# Patient Record
Sex: Female | Born: 1990 | Race: White | Hispanic: No | Marital: Single | Smoking: Former smoker
Health system: Southern US, Community
[De-identification: ages and names within clinical notes are randomized; demographics above are authoritative.]

## PROBLEM LIST (undated history)

## (undated) ENCOUNTER — Inpatient Hospital Stay (HOSPITAL_COMMUNITY): Payer: Self-pay

## (undated) DIAGNOSIS — O24419 Gestational diabetes mellitus in pregnancy, unspecified control: Secondary | ICD-10-CM

## (undated) DIAGNOSIS — F319 Bipolar disorder, unspecified: Secondary | ICD-10-CM

## (undated) DIAGNOSIS — F32A Depression, unspecified: Secondary | ICD-10-CM

## (undated) DIAGNOSIS — F329 Major depressive disorder, single episode, unspecified: Secondary | ICD-10-CM

## (undated) DIAGNOSIS — G809 Cerebral palsy, unspecified: Secondary | ICD-10-CM

## (undated) DIAGNOSIS — F419 Anxiety disorder, unspecified: Secondary | ICD-10-CM

## (undated) HISTORY — PX: HERNIA REPAIR: SHX51

---

## 2012-10-04 HISTORY — PX: CHOLECYSTECTOMY: SHX55

## 2014-05-23 ENCOUNTER — Inpatient Hospital Stay (HOSPITAL_COMMUNITY)
Admission: AD | Admit: 2014-05-23 | Discharge: 2014-05-23 | Disposition: A | Discharge status: Home / Self Care | Payer: Medicaid Other | Source: Ambulatory Visit | Attending: Obstetrics & Gynecology | Admitting: Obstetrics & Gynecology

## 2014-05-23 ENCOUNTER — Encounter (HOSPITAL_COMMUNITY): Payer: Self-pay | Admitting: *Deleted

## 2014-05-23 ENCOUNTER — Other Ambulatory Visit: Payer: Self-pay

## 2014-05-23 ENCOUNTER — Inpatient Hospital Stay (HOSPITAL_COMMUNITY): Payer: Medicaid Other

## 2014-05-23 DIAGNOSIS — R4789 Other speech disturbances: Secondary | ICD-10-CM | POA: Diagnosis not present

## 2014-05-23 DIAGNOSIS — O210 Mild hyperemesis gravidarum: Secondary | ICD-10-CM | POA: Insufficient documentation

## 2014-05-23 DIAGNOSIS — O9989 Other specified diseases and conditions complicating pregnancy, childbirth and the puerperium: Secondary | ICD-10-CM

## 2014-05-23 DIAGNOSIS — F341 Dysthymic disorder: Secondary | ICD-10-CM | POA: Diagnosis not present

## 2014-05-23 DIAGNOSIS — G809 Cerebral palsy, unspecified: Secondary | ICD-10-CM | POA: Insufficient documentation

## 2014-05-23 DIAGNOSIS — O99340 Other mental disorders complicating pregnancy, unspecified trimester: Secondary | ICD-10-CM | POA: Diagnosis not present

## 2014-05-23 DIAGNOSIS — O99891 Other specified diseases and conditions complicating pregnancy: Secondary | ICD-10-CM | POA: Diagnosis not present

## 2014-05-23 DIAGNOSIS — R112 Nausea with vomiting, unspecified: Secondary | ICD-10-CM

## 2014-05-23 HISTORY — DX: Cerebral palsy, unspecified: G80.9

## 2014-05-23 LAB — COMPREHENSIVE METABOLIC PANEL
ALT: 11 U/L (ref 0–35)
ANION GAP: 10 (ref 5–15)
AST: 15 U/L (ref 0–37)
Albumin: 3.5 g/dL (ref 3.5–5.2)
Alkaline Phosphatase: 79 U/L (ref 39–117)
BUN: 5 mg/dL — AB (ref 6–23)
CALCIUM: 8.6 mg/dL (ref 8.4–10.5)
CO2: 24 mEq/L (ref 19–32)
CREATININE: 0.55 mg/dL (ref 0.50–1.10)
Chloride: 102 mEq/L (ref 96–112)
GFR calc non Af Amer: >90 mL/min (ref >90)
GLUCOSE: 81 mg/dL (ref 70–99)
Potassium: 4.2 mEq/L (ref 3.7–5.3)
Sodium: 136 mEq/L — ABNORMAL LOW (ref 137–147)
TOTAL PROTEIN: 6.1 g/dL (ref 6.0–8.3)
Total Bilirubin: 0.3 mg/dL (ref 0.3–1.2)

## 2014-05-23 LAB — URINALYSIS, ROUTINE W REFLEX MICROSCOPIC
Bilirubin Urine: NEGATIVE
Glucose, UA: NEGATIVE mg/dL
HGB URINE DIPSTICK: NEGATIVE
Ketones, ur: NEGATIVE mg/dL
Nitrite: NEGATIVE
PROTEIN: NEGATIVE mg/dL
SPECIFIC GRAVITY, URINE: 1.015 (ref 1.005–1.030)
UROBILINOGEN UA: 0.2 mg/dL (ref 0.0–1.0)
pH: 8 (ref 5.0–8.0)

## 2014-05-23 LAB — LIPASE, BLOOD: Lipase: 21 U/L (ref 11–59)

## 2014-05-23 LAB — CBC WITH DIFFERENTIAL/PLATELET
BASOS ABS: 0 10*3/uL (ref 0.0–0.1)
BASOS PCT: 0 % (ref 0–1)
EOS ABS: 0 10*3/uL (ref 0.0–0.7)
EOS PCT: 1 % (ref 0–5)
HEMATOCRIT: 35.8 % — AB (ref 36.0–46.0)
HEMOGLOBIN: 12.5 g/dL (ref 12.0–15.0)
Lymphocytes Relative: 16 % (ref 12–46)
Lymphs Abs: 0.8 10*3/uL (ref 0.7–4.0)
MCH: 29.8 pg (ref 26.0–34.0)
MCHC: 34.9 g/dL (ref 30.0–36.0)
MCV: 85.4 fL (ref 78.0–100.0)
MONO ABS: 0.5 10*3/uL (ref 0.1–1.0)
MONOS PCT: 10 % (ref 3–12)
Neutro Abs: 3.9 10*3/uL (ref 1.7–7.7)
Neutrophils Relative %: 73 % (ref 43–77)
Platelets: 169 10*3/uL (ref 150–400)
RBC: 4.19 MIL/uL (ref 3.87–5.11)
RDW: 14.4 % (ref 11.5–15.5)
WBC: 5.3 10*3/uL (ref 4.0–10.5)

## 2014-05-23 LAB — URINE MICROSCOPIC-ADD ON

## 2014-05-23 LAB — AMYLASE: Amylase: 83 U/L (ref 0–105)

## 2014-05-23 MED ORDER — ONDANSETRON HCL 4 MG PO TABS: 4.0000 mg | ORAL_TABLET | Freq: Three times a day (TID) | ORAL | Status: DC | PRN

## 2014-05-23 MED ORDER — LACTATED RINGERS IV SOLN
INTRAVENOUS | Status: DC
  Administered 2014-05-23: 15:00:00 via INTRAVENOUS

## 2014-05-23 MED ORDER — ONDANSETRON HCL 4 MG/2ML IJ SOLN
4.0000 mg | Freq: Once | INTRAMUSCULAR | Status: AC
  Administered 2014-05-23: 4 mg via INTRAVENOUS
  Filled 2014-05-23: qty 2

## 2014-05-23 MED ORDER — NITROFURANTOIN MONOHYD MACRO 100 MG PO CAPS: 100.0000 mg | ORAL_CAPSULE | Freq: Two times a day (BID) | ORAL | Status: AC

## 2014-05-23 NOTE — Discharge Instructions (Signed)

## 2014-05-23 NOTE — MAU Provider Note (Signed)
Caitlin Bell is a 23 y.o. G3 P0202 at 12.1 weeks by [facility] by Epic.  She received Depo for heavy irregular periods a few months ago so she not sure of her LMP.  She presents to MAU from the office c/o n/v and dizziness since last night.  The office staff reported that she looked like she was about to pass out.  She denies pain, cramping, vb or lof.  She reports recent medication for depression and anxiety: Ativan, Lithium, Remeron, Buspar and Ambien.   [place] she was seeing a high risk OB in [place] because the medications.         There are no active problems to display for this patient.   Chief Complaint  Patient presents with  . Emesis During Pregnancy  . Dizziness   HPI  OB History   Grav Para Term Preterm Abortions TAB SAB Ect Mult Living   3 2  2      2       Past Medical History  Diagnosis Date  . Cerebral palsy     mild, speech impediment    Past Surgical History  Procedure Laterality Date  . Cholecystectomy  2014  . Hernia repair      History reviewed. No pertinent family history.  History  Substance Use Topics  . Smoking status: Never Smoker   . Smokeless tobacco: Never Used  . Alcohol Use: Yes     Comment: ocasionally, not with the pregnancy    Allergies: No Known Allergies  Prescriptions prior to admission  Medication Sig Dispense Refill  . acetaminophen (TYLENOL) 500 MG tablet Take 1,000 mg by mouth every 6 (six) hours as needed.        ROS See HPI above, all other systems are negative  Physical Exam   Blood pressure 110/76, pulse 97, temperature 98.8 F (37.1 C), temperature source Oral, resp. rate 16, height 5' 3.5" (1.613 m), weight 122 lb (55.339 kg), not currently breastfeeding.  Physical Exam  Chest: CTA Heart: RRR w/o murmer Ext:  WNL ABD: Soft, non tender to palpation, no rebound or guarding SVE: deferred   ED Course  Assessment: IUP at  12.1weeks Membranes:  intact FHR: 177 CTX: none   Plan: Labs: CBC, cmp, amylase, lipase IV LR Dating and viability US Consult with Dr. Burna Bell   Caitlin Bell, CNM, MSN 05/23/2014. 1:46 PM

## 2014-05-23 NOTE — MAU Provider Note (Signed)
Addendum Results for orders placed during the hospital encounter of 05/23/14 (from the past 24 hour(s))  URINALYSIS, ROUTINE W REFLEX MICROSCOPIC     Status: Abnormal   Collection Time    05/23/14 12:56 PM      Result Value Ref Range   Color, Urine YELLOW  YELLOW   APPearance CLOUDY (*) CLEAR   Specific Gravity, Urine 1.015  1.005 - 1.030   pH 8.0  5.0 - 8.0   Glucose, UA NEGATIVE  NEGATIVE mg/dL   Hgb urine dipstick NEGATIVE  NEGATIVE   Bilirubin Urine NEGATIVE  NEGATIVE   Ketones, ur NEGATIVE  NEGATIVE mg/dL   Protein, ur NEGATIVE  NEGATIVE mg/dL   Urobilinogen, UA 0.2  0.0 - 1.0 mg/dL   Nitrite NEGATIVE  NEGATIVE   Leukocytes, UA MODERATE (*) NEGATIVE  URINE MICROSCOPIC-ADD ON     Status: Abnormal   Collection Time    05/23/14 12:56 PM      Result Value Ref Range   Squamous Epithelial / LPF FEW (*) RARE   WBC, UA 7-10  <3 WBC/hpf   RBC / HPF 0-2  <3 RBC/hpf   Bacteria, UA MANY (*) RARE   Urine-Other AMORPHOUS URATES/PHOSPHATES     Recent Results (from the past 2160 hour(s))  URINALYSIS, ROUTINE W REFLEX MICROSCOPIC     Status: Abnormal   Collection Time    05/23/14 12:56 PM      Result Value Ref Range   Color, Urine YELLOW  YELLOW   APPearance CLOUDY (*) CLEAR   Specific Gravity, Urine 1.015  1.005 - 1.030   pH 8.0  5.0 - 8.0   Glucose, UA NEGATIVE  NEGATIVE mg/dL   Hgb urine dipstick NEGATIVE  NEGATIVE   Bilirubin Urine NEGATIVE  NEGATIVE   Ketones, ur NEGATIVE  NEGATIVE mg/dL   Protein, ur NEGATIVE  NEGATIVE mg/dL   Urobilinogen, UA 0.2  0.0 - 1.0 mg/dL   Nitrite NEGATIVE  NEGATIVE   Leukocytes, UA MODERATE (*) NEGATIVE  URINE MICROSCOPIC-ADD ON     Status: Abnormal   Collection Time    05/23/14 12:56 PM      Result Value Ref Range   Squamous Epithelial / LPF FEW (*) RARE   WBC, UA 7-10  <3 WBC/hpf   RBC / HPF 0-2  <3 RBC/hpf   Bacteria, UA MANY (*) RARE   Urine-Other AMORPHOUS URATES/PHOSPHATES     Comment: MUCOUS PRESENT  CBC WITH DIFFERENTIAL     Status:  Abnormal   Collection Time    05/23/14  3:49 PM      Result Value Ref Range   WBC 5.3  4.0 - 10.5 K/uL   RBC 4.19  3.87 - 5.11 MIL/uL   Hemoglobin 12.5  12.0 - 15.0 g/dL   HCT 35.8 (*) 36.0 - 46.0 %   MCV 85.4  78.0 - 100.0 fL   MCH 29.8  26.0 - 34.0 pg   MCHC 34.9  30.0 - 36.0 g/dL   RDW 14.4  11.5 - 15.5 %   Platelets 169  150 - 400 K/uL   Neutrophils Relative % 73  43 - 77 %   Neutro Abs 3.9  1.7 - 7.7 K/uL   Lymphocytes Relative 16  12 - 46 %   Lymphs Abs 0.8  0.7 - 4.0 K/uL   Monocytes Relative 10  3 - 12 %   Monocytes Absolute 0.5  0.1 - 1.0 K/uL   Eosinophils Relative 1  0 - 5 %  Eosinophils Absolute 0.0  0.0 - 0.7 K/uL   Basophils Relative 0  0 - 1 %   Basophils Absolute 0.0  0.0 - 0.1 K/uL  COMPREHENSIVE METABOLIC PANEL     Status: Abnormal   Collection Time    05/23/14  3:49 PM      Result Value Ref Range   Sodium 136 (*) 137 - 147 mEq/L   Potassium 4.2  3.7 - 5.3 mEq/L   Chloride 102  96 - 112 mEq/L   CO2 24  19 - 32 mEq/L   Glucose, Bld 81  70 - 99 mg/dL   BUN 5 (*) 6 - 23 mg/dL   Creatinine, Ser 0.55  0.50 - 1.10 mg/dL   Calcium 8.6  8.4 - 10.5 mg/dL   Total Protein 6.1  6.0 - 8.3 g/dL   Albumin 3.5  3.5 - 5.2 g/dL   AST 15  0 - 37 U/L   ALT 11  0 - 35 U/L   Alkaline Phosphatase 79  39 - 117 U/L   Total Bilirubin 0.3  0.3 - 1.2 mg/dL   GFR calc non Af Amer >90  >90 mL/min   GFR calc Af Amer >90  >90 mL/min   Comment: (NOTE)     The eGFR has been calculated using the CKD EPI equation.     This calculation has not been validated in all clinical situations.     eGFR's persistently <90 mL/min signify possible Chronic Kidney     Disease.   Anion gap 10  5 - 15   Pt report feeling better after IVF and zofran [place] report:  Intrauterine gestational sac: Present  Yolk sac: Not seen  Embryo: Present  Cardiac Activity: Present  Heart Rate: 158 bpm  CRL: 53.3 mm 12 w 0 d [place] EDC: 12/05/2014  Maternal uterus/adnexae: No subchorionic hemorrhage identified. The   ovaries have a normal appearance. No free pelvic fluid.  IMPRESSION:  1. Single living intrauterine embryo at 12 weeks 0 days by  ultrasound.  2. EDC by today's exam is 12/05/2014.   UTI macrobid 1 tab po bid x7 days Zofran 78m po q8 prn for nausea FU in the office in 2 weeks    Trenyce Loera, CNM, MSN 05/23/2014. 3:44 PM

## 2014-05-23 NOTE — MAU Note (Signed)
Unable to hold anything down in the past 24hrs. Getting very light headed.  Also noted bruises on legs and arms- no reason for them

## 2014-05-30 LAB — OB RESULTS CONSOLE GC/CHLAMYDIA
CHLAMYDIA, DNA PROBE: NEGATIVE
Gonorrhea: NEGATIVE

## 2014-05-30 LAB — OB RESULTS CONSOLE RUBELLA ANTIBODY, IGM: RUBELLA: IMMUNE

## 2014-05-30 LAB — OB RESULTS CONSOLE ANTIBODY SCREEN: ANTIBODY SCREEN: NEGATIVE

## 2014-05-30 LAB — OB RESULTS CONSOLE ABO/RH: RH Type: POSITIVE

## 2014-05-30 LAB — OB RESULTS CONSOLE HEPATITIS B SURFACE ANTIGEN: Hepatitis B Surface Ag: NEGATIVE

## 2014-05-30 LAB — OB RESULTS CONSOLE HIV ANTIBODY (ROUTINE TESTING): HIV: NONREACTIVE

## 2014-05-30 LAB — OB RESULTS CONSOLE RPR: RPR: NONREACTIVE

## 2014-07-19 ENCOUNTER — Other Ambulatory Visit (HOSPITAL_COMMUNITY): Payer: Self-pay | Admitting: Family Medicine

## 2014-07-19 DIAGNOSIS — Z3689 Encounter for other specified antenatal screening: Secondary | ICD-10-CM

## 2014-07-19 DIAGNOSIS — Z3A21 21 weeks gestation of pregnancy: Secondary | ICD-10-CM

## 2014-07-19 DIAGNOSIS — O283 Abnormal ultrasonic finding on antenatal screening of mother: Secondary | ICD-10-CM

## 2014-07-19 DIAGNOSIS — O358XX1 Maternal care for other (suspected) fetal abnormality and damage, fetus 1: Secondary | ICD-10-CM

## 2014-07-23 ENCOUNTER — Ambulatory Visit (HOSPITAL_COMMUNITY)
Admission: RE | Admit: 2014-07-23 | Discharge: 2014-07-23 | Disposition: A | Discharge status: Home / Self Care | Payer: Medicaid Other | Source: Ambulatory Visit | Attending: Certified Nurse Midwife | Admitting: Certified Nurse Midwife

## 2014-07-23 ENCOUNTER — Encounter (HOSPITAL_COMMUNITY): Payer: Self-pay

## 2014-07-23 VITALS — BP 119/73 | HR 105 | Wt 132.2 lb

## 2014-07-23 DIAGNOSIS — Z36 Encounter for antenatal screening of mother: Secondary | ICD-10-CM | POA: Diagnosis not present

## 2014-07-23 DIAGNOSIS — O358XX0 Maternal care for other (suspected) fetal abnormality and damage, not applicable or unspecified: Secondary | ICD-10-CM | POA: Insufficient documentation

## 2014-07-23 DIAGNOSIS — Z3A21 21 weeks gestation of pregnancy: Secondary | ICD-10-CM | POA: Diagnosis not present

## 2014-07-23 DIAGNOSIS — Z3A20 20 weeks gestation of pregnancy: Secondary | ICD-10-CM

## 2014-07-23 DIAGNOSIS — O283 Abnormal ultrasonic finding on antenatal screening of mother: Secondary | ICD-10-CM

## 2014-07-23 DIAGNOSIS — O358XX1 Maternal care for other (suspected) fetal abnormality and damage, fetus 1: Secondary | ICD-10-CM

## 2014-07-23 DIAGNOSIS — Z3689 Encounter for other specified antenatal screening: Secondary | ICD-10-CM

## 2014-07-23 HISTORY — DX: Major depressive disorder, single episode, unspecified: F32.9

## 2014-07-23 HISTORY — DX: Depression, unspecified: F32.A

## 2014-07-23 HISTORY — DX: Gestational diabetes mellitus in pregnancy, unspecified control: O24.419

## 2014-07-23 HISTORY — DX: Anxiety disorder, unspecified: F41.9

## 2014-07-31 ENCOUNTER — Encounter: Payer: Medicaid Other | Attending: Obstetrics and Gynecology

## 2014-07-31 VITALS — Ht 65.0 in | Wt 136.5 lb

## 2014-07-31 DIAGNOSIS — Z713 Dietary counseling and surveillance: Secondary | ICD-10-CM | POA: Insufficient documentation

## 2014-07-31 DIAGNOSIS — R7302 Impaired glucose tolerance (oral): Secondary | ICD-10-CM | POA: Insufficient documentation

## 2014-07-31 DIAGNOSIS — R7309 Other abnormal glucose: Secondary | ICD-10-CM

## 2014-07-31 NOTE — Progress Notes (Signed)
  Patient was seen on 07/31/14 for Gestational Diabetes self-management class at the [facility]. The following learning objectives were met by the patient during this course:   States the definition of Gestational Diabetes  States why dietary management is important in controlling blood glucose  Describes the effects each nutrient has on blood glucose levels  Demonstrates ability to create a balanced meal plan  Demonstrates carbohydrate counting   States when to check blood glucose levels  Demonstrates proper blood glucose monitoring techniques  States the effect of stress and exercise on blood glucose levels  States the importance of limiting caffeine and abstaining from alcohol and smoking  Blood glucose monitor given: Accu Chek Nano BG Monitoring Kit Lot # [id number] Exp: 06/04/15 Blood glucose reading: 89  Patient instructed to monitor glucose levels: FBS: 60 - <90 1 hour: <140 2 hour: <120  *Patient received handouts:  Nutrition Diabetes and Pregnancy  Carbohydrate Counting List  Patient will be seen for follow-up as needed.

## 2014-08-13 ENCOUNTER — Other Ambulatory Visit (HOSPITAL_COMMUNITY): Payer: Self-pay

## 2014-09-14 ENCOUNTER — Encounter (HOSPITAL_COMMUNITY): Payer: Self-pay

## 2014-09-14 ENCOUNTER — Inpatient Hospital Stay (HOSPITAL_COMMUNITY)
Admission: AD | Admit: 2014-09-14 | Discharge: 2014-09-14 | Disposition: A | Discharge status: Home / Self Care | Payer: Medicaid Other | Source: Ambulatory Visit | Attending: Obstetrics and Gynecology | Admitting: Obstetrics and Gynecology

## 2014-09-14 DIAGNOSIS — Z3A28 28 weeks gestation of pregnancy: Secondary | ICD-10-CM | POA: Diagnosis not present

## 2014-09-14 DIAGNOSIS — R4789 Other speech disturbances: Secondary | ICD-10-CM | POA: Insufficient documentation

## 2014-09-14 DIAGNOSIS — O9989 Other specified diseases and conditions complicating pregnancy, childbirth and the puerperium: Secondary | ICD-10-CM | POA: Diagnosis not present

## 2014-09-14 DIAGNOSIS — N898 Other specified noninflammatory disorders of vagina: Secondary | ICD-10-CM

## 2014-09-14 DIAGNOSIS — G809 Cerebral palsy, unspecified: Secondary | ICD-10-CM | POA: Insufficient documentation

## 2014-09-14 DIAGNOSIS — O26892 Other specified pregnancy related conditions, second trimester: Secondary | ICD-10-CM

## 2014-09-14 LAB — FETAL FIBRONECTIN: Fetal Fibronectin: NEGATIVE

## 2014-09-14 LAB — URINALYSIS, ROUTINE W REFLEX MICROSCOPIC
Bilirubin Urine: NEGATIVE
Glucose, UA: NEGATIVE mg/dL
Hgb urine dipstick: NEGATIVE
Ketones, ur: NEGATIVE mg/dL
Nitrite: NEGATIVE
Protein, ur: NEGATIVE mg/dL
Specific Gravity, Urine: 1.015 (ref 1.005–1.030)
Urobilinogen, UA: 0.2 mg/dL (ref 0.0–1.0)
pH: 7 (ref 5.0–8.0)

## 2014-09-14 LAB — GLUCOSE, CAPILLARY: Glucose-Capillary: 78 mg/dL (ref 70–99)

## 2014-09-14 LAB — WET PREP, GENITAL
Clue Cells Wet Prep HPF POC: NONE SEEN
TRICH WET PREP: NONE SEEN
Yeast Wet Prep HPF POC: NONE SEEN

## 2014-09-14 LAB — URINE MICROSCOPIC-ADD ON

## 2014-09-14 NOTE — MAU Provider Note (Signed)
History    Caitlin Bell is a 23 y.o. [phone] at 28.2wks who presents for vaginal discharge since last night.  Patient reports some right sided abdominal cramping that started yesterday.  Patient reports taking tylenol with no relief and states pain is constant.  No recent sexual intercourse, no problems with urination or constipation/diarrhea.  Reports ongoing nausea that is treated with zofran. Patient reports vaginal itching and states discharge is thick and white.  Patient reports fasting BS of 89 and no PP for breakfast.  Patient admits to some concern regarding history of PTL and reports she may be having braxton hicks contractions.  Denies LoF, VB, and reports fetal movement.   There are no active problems to display for this patient.   No chief complaint on file.  HPI  OB History    Gravida Para Term Preterm AB TAB SAB Ectopic Multiple Living   3 2 0 2      2      Past Medical History  Diagnosis Date  . Cerebral palsy     mild, speech impediment  . Anxiety   . Gestational diabetes   . Depression     Past Surgical History  Procedure Laterality Date  . Cholecystectomy  2014  . Hernia repair      No family history on file.  History  Substance Use Topics  . Smoking status: Never Smoker   . Smokeless tobacco: Never Used  . Alcohol Use: Yes     Comment: ocasionally, not with the pregnancy    Allergies: No Known Allergies  Prescriptions prior to admission  Medication Sig Dispense Refill Last Dose  . acetaminophen (TYLENOL) 500 MG tablet Take 1,000 mg by mouth every 6 (six) hours as needed.   Taking  . Lurasidone HCl (LATUDA PO) Take by mouth.   Taking  . ondansetron (ZOFRAN) 4 MG tablet Take 1 tablet (4 mg total) by mouth every 8 (eight) hours as needed for nausea or vomiting. 20 tablet 0 Taking    Review of Systems  Gastrointestinal: Positive for nausea.  Genitourinary: Negative.   All other systems reviewed and are negative.   See HPI Above Physical Exam    Blood pressure 118/66, pulse 99, temperature 98.3 F (36.8 C), temperature source Oral, resp. rate 18, not currently breastfeeding.  Results for orders placed or performed during the hospital encounter of 09/14/14 (from the past 24 hour(s))  Urinalysis, Routine w reflex microscopic     Status: Abnormal   Collection Time: 09/14/14 11:18 AM  Result Value Ref Range   Color, Urine YELLOW YELLOW   APPearance CLEAR CLEAR   Specific Gravity, Urine 1.015 1.005 - 1.030   pH 7.0 5.0 - 8.0   Glucose, UA NEGATIVE NEGATIVE mg/dL   Hgb urine dipstick NEGATIVE NEGATIVE   Bilirubin Urine NEGATIVE NEGATIVE   Ketones, ur NEGATIVE NEGATIVE mg/dL   Protein, ur NEGATIVE NEGATIVE mg/dL   Urobilinogen, UA 0.2 0.0 - 1.0 mg/dL   Nitrite NEGATIVE NEGATIVE   Leukocytes, UA SMALL (A) NEGATIVE  Urine microscopic-add on     Status: Abnormal   Collection Time: 09/14/14 11:18 AM  Result Value Ref Range   Squamous Epithelial / LPF FEW (A) RARE   WBC, UA 3-6 <3 WBC/hpf   Bacteria, UA FEW (A) RARE   Urine-Other MUCOUS PRESENT   Wet prep, genital     Status: Abnormal   Collection Time: 09/14/14 12:25 PM  Result Value Ref Range   Yeast Wet Prep HPF POC  NONE SEEN NONE SEEN   Trich, Wet Prep NONE SEEN NONE SEEN   Clue Cells Wet Prep HPF POC NONE SEEN NONE SEEN   WBC, Wet Prep HPF POC MODERATE (A) NONE SEEN  Fetal fibronectin     Status: None   Collection Time: 09/14/14 12:25 PM  Result Value Ref Range   Fetal Fibronectin NEGATIVE NEGATIVE  Glucose, capillary     Status: None   Collection Time: 09/14/14 12:33 PM  Result Value Ref Range   Glucose-Capillary 78 70 - 99 mg/dL    Physical Exam  Constitutional: She is oriented to person, place, and time.  GI: Soft. Normal appearance and bowel sounds are normal. There is no tenderness.  Gravid--fundal height appears AGA, Soft, NT  Genitourinary: Uterus is enlarged. Cervix exhibits discharge. Cervix exhibits no motion tenderness and no friability.  Sterile  Speculum Exam: -Vaginal Vault: Small amt thin white discharge-wet prep collected -Cervix: Thin white discharge from os fFN collected from posterior fornix Bimanual Exam: Closed/Long/Thick/Ballotable/Soft  Musculoskeletal: Normal range of motion.  Neurological: She is alert and oriented to person, place, and time.  Skin: Skin is warm and dry. Petechiae and rash noted. Rash is papular and pustular.  Rash on left leg near ankle.  Many papular spots around what appears a ruptured pustular spot.    FHR: 135 bpm, Mod Var, -Decels, +Accels UC: Intermittent, palpates mild  ED Course  Assessment: IUP at 28.2wks Cat I FT Vaginal Discharge  Plan: -PE as above -Labs: UA, fFN, Wet Prep -Continue to monitor  Follow Up (1350) -fFN negative -UA: Few Bacteria--Culture Sent -Wet prep-negative -Discussed results with patient -Educated on need to schedule appt and maintain adequate PNC -Discussed need to take CBGs QID and record findings -Bleeding and PTL Precautions -Encouraged to call if any questions or concerns arise prior to next scheduled office visit.  -Discharged to home in stable condition -[facility]  Marlene BastMLY, Maridel Pixler LYNN MSN, CNM 09/14/2014 11:16 AM

## 2014-09-14 NOTE — MAU Note (Signed)
Pt presents complaining of irregular contractions that started 2 days ago and a white discharge. Pt also has not felt baby move since yesterday. Denies vaginal bleeding or loss of fluid

## 2014-09-14 NOTE — Discharge Instructions (Signed)
Abdominal Pain During Pregnancy °Belly (abdominal) pain is common during pregnancy. Most of the time, it is not a serious problem. Other times, it can be a sign that something is wrong with the pregnancy. Always tell your doctor if you have belly pain. °HOME CARE °Monitor your belly pain for any changes. The following actions may help you feel better: °· Do not have sex (intercourse) or put anything in your vagina until you feel better. °· Rest until your pain stops. °· Drink clear fluids if you feel sick to your stomach (nauseous). Do not eat solid food until you feel better. °· Only take medicine as told by your doctor. °· Keep all doctor visits as told. °GET HELP RIGHT AWAY IF:  °· You are bleeding, leaking fluid, or pieces of tissue come out of your vagina. °· You have more pain or cramping. °· You keep throwing up (vomiting). °· You have pain when you pee (urinate) or have blood in your pee. °· You have a fever. °· You do not feel your baby moving as much. °· You feel very weak or feel like passing out. °· You have trouble breathing, with or without belly pain. °· You have a very bad headache and belly pain. °· You have fluid leaking from your vagina and belly pain. °· You keep having watery poop (diarrhea). °· Your belly pain does not go away after resting, or the pain gets worse. °MAKE SURE YOU:  °· Understand these instructions. °· Will watch your condition. °· Will get help right away if you are not doing well or get worse. °Document Released: 09/08/2009 Document Revised: 05/23/2013 Document Reviewed: 04/19/2013 °[employer]® Patient Information ©2015 [employer]. This information is not intended to replace advice given to you by your health care provider. Make sure you discuss any questions you have with your health care provider. ° °

## 2014-09-15 LAB — CULTURE, OB URINE

## 2014-10-04 NOTE — L&D Delivery Note (Signed)
Vaginal Delivery Note The pt utilized an epidural as pain management.   Spontaneous rupture of membranes yesterday at 0001, clear.  GBS was positive, AMP x 1 doses was given.  Cervical dilation was complete at 0006   Spontaneous self directed pushing began at  0004.  After 5 minutes of pushing the head, shoulders and the body of a viable female infant "Cornelius MorasOwen" delivered spontaneously with maternal effort in the ROA position at 0009.   Loose [place] x 1, easily reduced.  The cord was clamped, cut and the infant was handed to the NICU team/nursing staff for immediate assessment .    Spontaneous delivery of a intact placenta with a 3 vessel cord via [place]  0013.   Episiotomy: None   The vulva, perineum, vaginal vault, rectum and cervix were inspected and revealed a 1 degree perineal, 1 degree vaginal which was repair using a 3-0 vicryl on a CT needle and a superficial bilateral labial -  bilateral periurethral laceration which was repaired using a 4-0 vicryl on a SH needle with 20cc of 1% lidocaine.   Postpartum pitocin as ordered.  Fundus firm, lochia minimum, bleeding under control.    EBL 100, Pt hemodynamically stable.   Sponge, laps and needle count correct and verified with the primary care nurse.  Attending MD available at all times.    Mom and baby were left in stable condition, baby skin to skin. Routine postpartum orders   Mother desires Depo for contraception   Infant to have OP patient circumcision   Placenta to pathology: NO     Cord Gases sent to lab: NO Cord blood sent to lab: YES   APGARS:  7 at 1 minute and 8 at 5 minutes Weight:. pending     Daijanae Rafalski, CNM, MSN 10/31/2014. 3:04 AM

## 2014-10-14 ENCOUNTER — Encounter (HOSPITAL_COMMUNITY): Payer: Self-pay | Admitting: *Deleted

## 2014-10-14 ENCOUNTER — Inpatient Hospital Stay (HOSPITAL_COMMUNITY)
Admission: AD | Admit: 2014-10-14 | Discharge: 2014-10-14 | Disposition: A | Discharge status: Home / Self Care | Payer: Medicaid Other | Source: Ambulatory Visit | Attending: Obstetrics and Gynecology | Admitting: Obstetrics and Gynecology

## 2014-10-14 DIAGNOSIS — M549 Dorsalgia, unspecified: Secondary | ICD-10-CM | POA: Insufficient documentation

## 2014-10-14 DIAGNOSIS — Z3A32 32 weeks gestation of pregnancy: Secondary | ICD-10-CM | POA: Insufficient documentation

## 2014-10-14 DIAGNOSIS — O9989 Other specified diseases and conditions complicating pregnancy, childbirth and the puerperium: Secondary | ICD-10-CM | POA: Diagnosis not present

## 2014-10-14 DIAGNOSIS — O99891 Other specified diseases and conditions complicating pregnancy: Secondary | ICD-10-CM | POA: Diagnosis present

## 2014-10-14 LAB — URINALYSIS, ROUTINE W REFLEX MICROSCOPIC
BILIRUBIN URINE: NEGATIVE
GLUCOSE, UA: NEGATIVE mg/dL
KETONES UR: NEGATIVE mg/dL
Nitrite: NEGATIVE
Protein, ur: NEGATIVE mg/dL
SPECIFIC GRAVITY, URINE: 1.015 (ref 1.005–1.030)
Urobilinogen, UA: 0.2 mg/dL (ref 0.0–1.0)
pH: 7.5 (ref 5.0–8.0)

## 2014-10-14 LAB — GLUCOSE, CAPILLARY: Glucose-Capillary: 77 mg/dL (ref 70–99)

## 2014-10-14 LAB — URINE MICROSCOPIC-ADD ON

## 2014-10-14 MED ORDER — ACETAMINOPHEN 500 MG PO TABS
1000.0000 mg | ORAL_TABLET | Freq: Once | ORAL | Status: AC
  Administered 2014-10-14: 1000 mg via ORAL
  Filled 2014-10-14: qty 2

## 2014-10-14 MED ORDER — ACETAMINOPHEN 325 MG PO TABS: 650.0000 mg | ORAL_TABLET | Freq: Once | ORAL | Status: DC

## 2014-10-14 NOTE — MAU Note (Signed)
Pt reports lower back pain 7/10 x2 days that has gotten worse over last hour, along with intermittent abdominal tightening x 2 hrs every 30 min. No bleeding, leaking, +FM.
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
## 2014-10-14 NOTE — Discharge Instructions (Signed)
Back Injury Prevention Back injuries can be extremely painful and difficult to heal. After having one back injury, you are much more likely to experience another later on. It is important to learn how to avoid injuring or re-injuring your back. The following tips can help you to prevent a back injury. PHYSICAL FITNESS  Exercise regularly and try to develop good tone in your abdominal muscles. Your abdominal muscles provide a lot of the support needed by your back.  Do aerobic exercises (walking, jogging, biking, swimming) regularly.  Do exercises that increase balance and strength (tai chi, yoga) regularly. This can decrease your risk of falling and injuring your back.  Stretch before and after exercising.  Maintain a healthy weight. The more you weigh, the more stress is placed on your back. For every pound of weight, 10 times that amount of pressure is placed on the back. DIET  Talk to your caregiver about how much calcium and vitamin D you need per day. These nutrients help to prevent weakening of the bones (osteoporosis). Osteoporosis can cause broken (fractured) bones that lead to back pain.  Include good sources of calcium in your diet, such as dairy products, green, leafy vegetables, and products with calcium added (fortified).  Include good sources of vitamin D in your diet, such as milk and foods that are fortified with vitamin D.  Consider taking a nutritional supplement or a multivitamin if needed.  Stop smoking if you smoke. POSTURE  Sit and stand up straight. Avoid leaning forward when you sit or hunching over when you stand.  Choose chairs with good low back (lumbar) support.  If you work at a desk, sit close to your work so you do not need to lean over. Keep your chin tucked in. Keep your neck drawn back and elbows bent at a right angle. Your arms should look like the letter "L."  Sit high and close to the steering wheel when you drive. Add a lumbar support to your car  seat if needed.  Avoid sitting or standing in one position for too long. Take breaks to get up, stretch, and walk around at least once every hour. Take breaks if you are driving for long periods of time.  Sleep on your side with your knees slightly bent, or sleep on your back with a pillow under your knees. Do not sleep on your stomach. LIFTING, TWISTING, AND REACHING  Avoid heavy lifting, especially repetitive lifting. If you must do heavy lifting:  Stretch before lifting.  Work slowly.  Rest between lifts.  Use carts and dollies to move objects when possible.  Make several small trips instead of carrying 1 heavy load.  Ask for help when you need it.  Ask for help when moving big, awkward objects.  Follow these steps when lifting:  Stand with your feet shoulder-width apart.  Get as close to the object as you can. Do not try to pick up heavy objects that are far from your body.  Use handles or lifting straps if they are available.  Bend at your knees. Squat down, but keep your heels off the floor.  Keep your shoulders pulled back, your chin tucked in, and your back straight.  Lift the object slowly, tightening the muscles in your legs, abdomen, and buttocks. Keep the object as close to the center of your body as possible.  When you put a load down, use these same guidelines in reverse.  Do not:  Lift the object above your waist.  Twist at the waist while lifting or carrying a load. Move your feet if you need to turn, not your waist.  Bend over without bending at your knees.  Avoid reaching over your head, across a table, or for an object on a high surface. OTHER TIPS  Avoid wet floors and keep sidewalks clear of ice to prevent falls.  Do not sleep on a mattress that is too soft or too hard.  Keep items that are used frequently within easy reach.  Put heavier objects on shelves at waist level and lighter objects on lower or higher shelves.  Find ways to  decrease your stress, such as exercise, massage, or relaxation techniques. Stress can build up in your muscles. Tense muscles are more vulnerable to injury.  Seek treatment for depression or anxiety if needed. These conditions can increase your risk of developing back pain. SEEK MEDICAL CARE IF:  You injure your back.  You have questions about diet, exercise, or other ways to prevent back injuries. MAKE SURE YOU:  Understand these instructions.  Will watch your condition.  Will get help right away if you are not doing well or get worse. Document Released: 10/28/2004 Document Revised: 12/13/2011 Document Reviewed: 11/01/2011 [facility] Patient Information 2015 [place], [place]. This information is not intended to replace advice given to you by your health care provider. Make sure you discuss any questions you have with your health care provider. Back Pain in Pregnancy Back pain during pregnancy is common. It happens in about half of all pregnancies. It is important for you and your baby that you remain active during your pregnancy.If you feel that back pain is not allowing you to remain active or sleep well, it is time to see your caregiver. Back pain may be caused by several factors related to changes during your pregnancy.Fortunately, unless you had trouble with your back before your pregnancy, the pain is likely to get better after you deliver. Low back pain usually occurs between the fifth and seventh months of pregnancy. It can, however, happen in the first couple months. Factors that increase the risk of back problems include:   Previous back problems.  Injury to your back.  Having twins or multiple births.  A chronic cough.  Stress.  Job-related repetitive motions.  Muscle or spinal disease in the back.  Family history of back problems, ruptured (herniated) discs, or osteoporosis.  Depression, anxiety, and panic attacks. CAUSES   When you are pregnant, your body produces  a hormone called relaxin. This hormonemakes the ligaments connecting the low back and pubic bones more flexible. This flexibility allows the baby to be delivered more easily. When your ligaments are loose, your muscles need to work harder to support your back. Soreness in your back can come from tired muscles. Soreness can also come from back tissues that are irritated since they are receiving less support.  As the baby grows, it puts pressure on the nerves and blood vessels in your pelvis. This can cause back pain.  As the baby grows and gets heavier during pregnancy, the uterus pushes the stomach muscles forward and changes your center of gravity. This makes your back muscles work harder to maintain good posture. SYMPTOMS  Lumbar pain during pregnancy Lumbar pain during pregnancy usually occurs at or above the waist in the center of the back. There may be pain and numbness that radiates into your leg or foot. This is similar to low back pain experienced by non-pregnant women. It usually increases with  sitting for long periods of time, standing, or repetitive lifting. Tenderness may also be present in the muscles along your upper back. Posterior pelvic pain during pregnancy Pain in the back of the pelvis is more common than lumbar pain in pregnancy. It is a deep pain felt in your side at the waistline, or across the tailbone (sacrum), or in both places. You may have pain on one or both sides. This pain can also go into the buttocks and backs of the upper thighs. Pubic and groin pain may also be present. The pain does not quickly resolve with rest, and morning stiffness may also be present. Pelvic pain during pregnancy can be brought on by most activities. A high level of fitness before and during pregnancy may or may not prevent this problem. Labor pain is usually 1 to 2 minutes apart, lasts for about 1 minute, and involves a bearing down feeling or pressure in your pelvis. However, if you are at term  with the pregnancy, constant low back pain can be the beginning of early labor, and you should be aware of this. DIAGNOSIS  X-rays of the back should not be done during the first 12 to 14 weeks of the pregnancy and only when absolutely necessary during the rest of the pregnancy. MRIs do not give off radiation and are safe during pregnancy. MRIs also should only be done when absolutely necessary. HOME CARE INSTRUCTIONS  Exercise as directed by your caregiver. Exercise is the most effective way to prevent or manage back pain. If you have a back problem, it is especially important to avoid sports that require sudden body movements. Swimming and walking are great activities.  Do not stand in one place for long periods of time.  Do not wear high heels.  Sit in chairs with good posture. Use a pillow on your lower back if necessary. Make sure your head rests over your shoulders and is not hanging forward.  Try sleeping on your side, preferably the left side, with a pillow or two between your legs. If you are sore after a night's rest, your bedmay betoo soft.Try placing a board between your mattress and box spring.  Listen to your body when lifting.If you are experiencing pain, ask for help or try bending yourknees more so you can use your leg muscles rather than your back muscles. Squat down when picking up something from the floor. Do not bend over.  Eat a healthy diet. Try to gain weight within your caregiver's recommendations.  Use heat or cold packs 3 to 4 times a day for 15 minutes to help with the pain.  Only take over-the-counter or prescription medicines for pain, discomfort, or fever as directed by your caregiver. Sudden (acute) back pain  Use bed rest for only the most extreme, acute episodes of back pain. Prolonged bed rest over 48 hours will aggravate your condition.  Ice is very effective for acute conditions.  Put ice in a plastic bag.  Place a towel between your skin and  the bag.  Leave the ice on for 10 to 20 minutes every 2 hours, or as needed.  Using heat packs for 30 minutes prior to activities is also helpful. Continued back pain See your caregiver if you have continued problems. Your caregiver can help or refer you for appropriate physical therapy. With conditioning, most back problems can be avoided. Sometimes, a more serious issue may be the cause of back pain. You should be seen right away if new problems  seem to be developing. Your caregiver may recommend:  A maternity girdle.  An elastic sling.  A back brace.  A massage therapist or acupuncture. SEEK MEDICAL CARE IF:   You are not able to do most of your daily activities, even when taking the pain medicine you were given.  You need a referral to a physical therapist or chiropractor.  You want to try acupuncture. SEEK IMMEDIATE MEDICAL CARE IF:  You develop numbness, tingling, weakness, or problems with the use of your arms or legs.  You develop severe back pain that is no longer relieved with medicines.  You have a sudden change in bowel or bladder control.  You have increasing pain in other areas of the body.  You develop shortness of breath, dizziness, or fainting.  You develop nausea, vomiting, or sweating.  You have back pain which is similar to labor pains.  You have back pain along with your water breaking or vaginal bleeding.  You have back pain or numbness that travels down your leg.  Your back pain developed after you fell.  You develop pain on one side of your back. You may have a kidney stone.  You see blood in your urine. You may have a bladder infection or kidney stone.  You have back pain with blisters. You may have shingles. Back pain is fairly common during pregnancy but should not be accepted as just part of the process. Back pain should always be treated as soon as possible. This will make your pregnancy as pleasant as possible. Document Released:  12/29/2005 Document Revised: 12/13/2011 Document Reviewed: 02/09/2011 [facility] Patient Information 2015 [place], [place]. This information is not intended to replace advice given to you by your health care provider. Make sure you discuss any questions you have with your health care provider.

## 2014-10-15 LAB — CULTURE, OB URINE: Colony Count: >=100000

## 2014-10-30 ENCOUNTER — Inpatient Hospital Stay (HOSPITAL_COMMUNITY): Payer: Medicaid Other

## 2014-10-30 ENCOUNTER — Inpatient Hospital Stay (HOSPITAL_COMMUNITY): Payer: Medicaid Other | Admitting: Anesthesiology

## 2014-10-30 ENCOUNTER — Inpatient Hospital Stay (HOSPITAL_COMMUNITY)
Admission: AD | Admit: 2014-10-30 | Discharge: 2014-11-02 | DRG: 775 | Disposition: A | Discharge status: Home / Self Care | Payer: Medicaid Other | Source: Ambulatory Visit | Attending: Obstetrics and Gynecology | Admitting: Obstetrics and Gynecology

## 2014-10-30 ENCOUNTER — Encounter (HOSPITAL_COMMUNITY): Payer: Self-pay | Admitting: General Practice

## 2014-10-30 DIAGNOSIS — O99344 Other mental disorders complicating childbirth: Secondary | ICD-10-CM | POA: Diagnosis present

## 2014-10-30 DIAGNOSIS — Z8751 Personal history of pre-term labor: Secondary | ICD-10-CM

## 2014-10-30 DIAGNOSIS — O09219 Supervision of pregnancy with history of pre-term labor, unspecified trimester: Secondary | ICD-10-CM

## 2014-10-30 DIAGNOSIS — O2662 Liver and biliary tract disorders in childbirth: Principal | ICD-10-CM | POA: Diagnosis present

## 2014-10-30 DIAGNOSIS — F411 Generalized anxiety disorder: Secondary | ICD-10-CM | POA: Diagnosis present

## 2014-10-30 DIAGNOSIS — Z3A34 34 weeks gestation of pregnancy: Secondary | ICD-10-CM | POA: Diagnosis present

## 2014-10-30 DIAGNOSIS — O99354 Diseases of the nervous system complicating childbirth: Secondary | ICD-10-CM | POA: Diagnosis present

## 2014-10-30 DIAGNOSIS — F1291 Cannabis use, unspecified, in remission: Secondary | ICD-10-CM

## 2014-10-30 DIAGNOSIS — F319 Bipolar disorder, unspecified: Secondary | ICD-10-CM | POA: Diagnosis present

## 2014-10-30 DIAGNOSIS — Z833 Family history of diabetes mellitus: Secondary | ICD-10-CM

## 2014-10-30 DIAGNOSIS — Z87898 Personal history of other specified conditions: Secondary | ICD-10-CM

## 2014-10-30 DIAGNOSIS — O24419 Gestational diabetes mellitus in pregnancy, unspecified control: Secondary | ICD-10-CM | POA: Diagnosis present

## 2014-10-30 DIAGNOSIS — O24420 Gestational diabetes mellitus in childbirth, diet controlled: Secondary | ICD-10-CM | POA: Diagnosis present

## 2014-10-30 DIAGNOSIS — K429 Umbilical hernia without obstruction or gangrene: Secondary | ICD-10-CM | POA: Diagnosis not present

## 2014-10-30 DIAGNOSIS — F329 Major depressive disorder, single episode, unspecified: Secondary | ICD-10-CM | POA: Diagnosis present

## 2014-10-30 DIAGNOSIS — E755 Other lipid storage disorders: Secondary | ICD-10-CM

## 2014-10-30 DIAGNOSIS — Z87891 Personal history of nicotine dependence: Secondary | ICD-10-CM | POA: Diagnosis not present

## 2014-10-30 DIAGNOSIS — Z3A35 35 weeks gestation of pregnancy: Secondary | ICD-10-CM | POA: Insufficient documentation

## 2014-10-30 DIAGNOSIS — G809 Cerebral palsy, unspecified: Secondary | ICD-10-CM | POA: Diagnosis present

## 2014-10-30 DIAGNOSIS — Z8249 Family history of ischemic heart disease and other diseases of the circulatory system: Secondary | ICD-10-CM

## 2014-10-30 DIAGNOSIS — O26619 Liver and biliary tract disorders in pregnancy, unspecified trimester: Secondary | ICD-10-CM

## 2014-10-30 DIAGNOSIS — K831 Obstruction of bile duct: Secondary | ICD-10-CM | POA: Diagnosis present

## 2014-10-30 DIAGNOSIS — F32A Depression, unspecified: Secondary | ICD-10-CM | POA: Diagnosis present

## 2014-10-30 DIAGNOSIS — O09899 Supervision of other high risk pregnancies, unspecified trimester: Secondary | ICD-10-CM

## 2014-10-30 DIAGNOSIS — Z8659 Personal history of other mental and behavioral disorders: Secondary | ICD-10-CM

## 2014-10-30 LAB — CBC
HCT: 34.7 % — ABNORMAL LOW (ref 36.0–46.0)
HEMOGLOBIN: 11.3 g/dL — AB (ref 12.0–15.0)
MCH: 27.1 pg (ref 26.0–34.0)
MCHC: 32.6 g/dL (ref 30.0–36.0)
MCV: 83.2 fL (ref 78.0–100.0)
Platelets: 217 10*3/uL (ref 150–400)
RBC: 4.17 MIL/uL (ref 3.87–5.11)
RDW: 13.1 % (ref 11.5–15.5)
WBC: 11 10*3/uL — AB (ref 4.0–10.5)

## 2014-10-30 LAB — WET PREP, GENITAL
Clue Cells Wet Prep HPF POC: NONE SEEN
Trich, Wet Prep: NONE SEEN

## 2014-10-30 LAB — URINALYSIS, ROUTINE W REFLEX MICROSCOPIC
Bilirubin Urine: NEGATIVE
GLUCOSE, UA: NEGATIVE mg/dL
KETONES UR: NEGATIVE mg/dL
NITRITE: NEGATIVE
PH: 7 (ref 5.0–8.0)
Protein, ur: NEGATIVE mg/dL
Specific Gravity, Urine: 1.015 (ref 1.005–1.030)
Urobilinogen, UA: 0.2 mg/dL (ref 0.0–1.0)

## 2014-10-30 LAB — OB RESULTS CONSOLE GBS: GBS: NEGATIVE

## 2014-10-30 LAB — TYPE AND SCREEN
ABO/RH(D): O POS
ANTIBODY SCREEN: NEGATIVE

## 2014-10-30 LAB — URINE MICROSCOPIC-ADD ON

## 2014-10-30 LAB — GROUP B STREP BY PCR: Group B strep by PCR: NEGATIVE

## 2014-10-30 MED ORDER — DOCUSATE SODIUM 100 MG PO CAPS: 100.0000 mg | ORAL_CAPSULE | Freq: Every day | ORAL | Status: DC

## 2014-10-30 MED ORDER — ONDANSETRON HCL 4 MG/2ML IJ SOLN
4.0000 mg | Freq: Four times a day (QID) | INTRAMUSCULAR | Status: DC | PRN
  Administered 2014-10-30: 4 mg via INTRAVENOUS
  Filled 2014-10-30: qty 2

## 2014-10-30 MED ORDER — ZOLPIDEM TARTRATE 5 MG PO TABS: 5.0000 mg | ORAL_TABLET | Freq: Every evening | ORAL | Status: DC | PRN

## 2014-10-30 MED ORDER — ACETAMINOPHEN 325 MG PO TABS: 650.0000 mg | ORAL_TABLET | ORAL | Status: DC | PRN

## 2014-10-30 MED ORDER — PHENYLEPHRINE 40 MCG/ML (10ML) SYRINGE FOR IV PUSH (FOR BLOOD PRESSURE SUPPORT)
80.0000 ug | PREFILLED_SYRINGE | INTRAVENOUS | Status: DC | PRN
  Filled 2014-10-30: qty 2

## 2014-10-30 MED ORDER — FENTANYL 2.5 MCG/ML BUPIVACAINE 1/10 % EPIDURAL INFUSION (WH - ANES)
14.0000 mL/h | INTRAMUSCULAR | Status: DC | PRN
  Administered 2014-10-30: 14 mL/h via EPIDURAL
  Filled 2014-10-30: qty 125

## 2014-10-30 MED ORDER — LACTATED RINGERS IV SOLN
500.0000 mL | Freq: Once | INTRAVENOUS | Status: AC
  Administered 2014-10-30: 500 mL via INTRAVENOUS

## 2014-10-30 MED ORDER — EPHEDRINE 5 MG/ML INJ
10.0000 mg | INTRAVENOUS | Status: DC | PRN
  Filled 2014-10-30: qty 2

## 2014-10-30 MED ORDER — CALCIUM CARBONATE ANTACID 500 MG PO CHEW: 2.0000 | CHEWABLE_TABLET | ORAL | Status: DC | PRN

## 2014-10-30 MED ORDER — LIDOCAINE HCL (PF) 1 % IJ SOLN
30.0000 mL | INTRAMUSCULAR | Status: AC | PRN
  Administered 2014-10-31: 30 mL via SUBCUTANEOUS
  Filled 2014-10-30: qty 30

## 2014-10-30 MED ORDER — LIDOCAINE HCL (PF) 1 % IJ SOLN
INTRAMUSCULAR | Status: DC | PRN
  Administered 2014-10-30 (×2): 5 mL

## 2014-10-30 MED ORDER — LACTATED RINGERS IV SOLN
INTRAVENOUS | Status: DC
  Administered 2014-10-30: 22:00:00 via INTRAVENOUS

## 2014-10-30 MED ORDER — NALBUPHINE HCL 10 MG/ML IJ SOLN: 5.0000 mg | INTRAMUSCULAR | Status: DC | PRN

## 2014-10-30 MED ORDER — CITRIC ACID-SODIUM CITRATE 334-500 MG/5ML PO SOLN: 30.0000 mL | ORAL | Status: DC | PRN

## 2014-10-30 MED ORDER — PRENATAL MULTIVITAMIN CH: 1.0000 | ORAL_TABLET | Freq: Every day | ORAL | Status: DC

## 2014-10-30 MED ORDER — LACTATED RINGERS IV SOLN: INTRAVENOUS | Status: DC

## 2014-10-30 MED ORDER — FENTANYL CITRATE 0.05 MG/ML IJ SOLN
100.0000 ug | INTRAMUSCULAR | Status: DC | PRN
  Administered 2014-10-30 (×2): 100 ug via INTRAVENOUS
  Filled 2014-10-30 (×2): qty 2

## 2014-10-30 MED ORDER — LACTATED RINGERS IV SOLN: 500.0000 mL | INTRAVENOUS | Status: DC | PRN

## 2014-10-30 MED ORDER — OXYTOCIN 40 UNITS IN LACTATED RINGERS INFUSION - SIMPLE MED
62.5000 mL/h | INTRAVENOUS | Status: DC
  Filled 2014-10-30: qty 1000

## 2014-10-30 MED ORDER — OXYCODONE-ACETAMINOPHEN 5-325 MG PO TABS: 1.0000 | ORAL_TABLET | ORAL | Status: DC | PRN

## 2014-10-30 MED ORDER — DIPHENHYDRAMINE HCL 50 MG/ML IJ SOLN: 12.5000 mg | INTRAMUSCULAR | Status: DC | PRN

## 2014-10-30 MED ORDER — LURASIDONE HCL 20 MG PO TABS: 1.0000 | ORAL_TABLET | Freq: Every day | ORAL | Status: DC

## 2014-10-30 MED ORDER — OXYTOCIN BOLUS FROM INFUSION
500.0000 mL | INTRAVENOUS | Status: DC
  Administered 2014-10-31: 500 mL via INTRAVENOUS

## 2014-10-30 MED ORDER — PHENYLEPHRINE 40 MCG/ML (10ML) SYRINGE FOR IV PUSH (FOR BLOOD PRESSURE SUPPORT)
80.0000 ug | PREFILLED_SYRINGE | INTRAVENOUS | Status: DC | PRN
  Filled 2014-10-30: qty 2
  Filled 2014-10-30: qty 20

## 2014-10-30 MED ORDER — SODIUM CHLORIDE 0.9 % IV SOLN
2.0000 g | Freq: Once | INTRAVENOUS | Status: AC
  Administered 2014-10-30: 2 g via INTRAVENOUS
  Filled 2014-10-30: qty 2000

## 2014-10-30 MED ORDER — OXYCODONE-ACETAMINOPHEN 5-325 MG PO TABS: 2.0000 | ORAL_TABLET | ORAL | Status: DC | PRN

## 2014-10-30 MED ORDER — LURASIDONE HCL 20 MG PO TABS
1.0000 | ORAL_TABLET | Freq: Every day | ORAL | Status: DC
  Filled 2014-10-30: qty 1

## 2014-10-30 MED ORDER — LACTATED RINGERS IV BOLUS (SEPSIS)
1000.0000 mL | Freq: Once | INTRAVENOUS | Status: AC
  Administered 2014-10-30: 1000 mL via INTRAVENOUS

## 2014-10-30 NOTE — Anesthesia Procedure Notes (Signed)
Epidural Patient location during procedure: OB Start time: 10/30/2014 10:33 PM  Staffing Anesthesiologist: Brayton CavesJACKSON, Marcellina Jonsson Performed by: anesthesiologist   Preanesthetic Checklist Completed: patient identified, site marked, surgical consent, pre-op evaluation, timeout performed, IV checked, risks and benefits discussed and monitors and equipment checked  Epidural Patient position: sitting Prep: site prepped and draped and DuraPrep Patient monitoring: continuous pulse ox and blood pressure Approach: midline Location: L3-L4 Injection technique: LOR air  Needle:  Needle type: Tuohy  Needle gauge: 17 G Needle length: 9 cm and 9 Needle insertion depth: 5 cm cm Catheter type: closed end flexible Catheter size: 19 Gauge Catheter at skin depth: 10 cm Test dose: negative  Assessment Events: blood not aspirated, injection not painful, no injection resistance, negative IV test and no paresthesia  Additional Notes Patient identified.  Risk benefits discussed including failed block, incomplete pain control, headache, nerve damage, paralysis, blood pressure changes, nausea, vomiting, reactions to medication both toxic or allergic, and postpartum back pain.  Patient expressed understanding and wished to proceed.  All questions were answered.  Sterile technique used throughout procedure and epidural site dressed with sterile barrier dressing. No paresthesia or other complications noted.The patient did not experience any signs of intravascular injection such as tinnitus or metallic taste in mouth nor signs of intrathecal spread such as rapid motor block. Please see nursing notes for vital signs.

## 2014-10-30 NOTE — MAU Provider Note (Signed)
Called to MAU pt c/o increased pressure in her lower back. Ve 4-5/80/0 Pt to be admitted to L&D

## 2014-10-30 NOTE — MAU Provider Note (Signed)
MAU Addendum Note  Results for orders placed or performed during the hospital encounter of 10/30/14 (from the past 24 hour(s))  Group B strep by PCR     Status: None   Collection Time: 10/30/14  4:19 PM  Result Value Ref Range   Group B strep by PCR NEGATIVE NEGATIVE  Urinalysis, Routine w reflex microscopic     Status: Abnormal   Collection Time: 10/30/14  4:21 PM  Result Value Ref Range   Color, Urine YELLOW YELLOW   APPearance CLEAR CLEAR   Specific Gravity, Urine 1.015 1.005 - 1.030   pH 7.0 5.0 - 8.0   Glucose, UA NEGATIVE NEGATIVE mg/dL   Hgb urine dipstick SMALL (A) NEGATIVE   Bilirubin Urine NEGATIVE NEGATIVE   Ketones, ur NEGATIVE NEGATIVE mg/dL   Protein, ur NEGATIVE NEGATIVE mg/dL   Urobilinogen, UA 0.2 0.0 - 1.0 mg/dL   Nitrite NEGATIVE NEGATIVE   Leukocytes, UA LARGE (A) NEGATIVE  Urine microscopic-add on     Status: Abnormal   Collection Time: 10/30/14  4:21 PM  Result Value Ref Range   Squamous Epithelial / LPF FEW (A) RARE   WBC, UA 3-6 <3 WBC/hpf   Bacteria, UA FEW (A) RARE   Urine-Other FEW YEAST   CBC     Status: Abnormal   Collection Time: 10/30/14  4:46 PM  Result Value Ref Range   WBC 11.0 (H) 4.0 - 10.5 K/uL   RBC 4.17 3.87 - 5.11 MIL/uL   Hemoglobin 11.3 (L) 12.0 - 15.0 g/dL   HCT 16.134.7 (L) 09.636.0 - 04.546.0 %   MCV 83.2 78.0 - 100.0 fL   MCH 27.1 26.0 - 34.0 pg   MCHC 32.6 30.0 - 36.0 g/dL   RDW 40.913.1 81.111.5 - 91.415.5 %   Platelets 217 150 - 400 K/uL  Type and screen     Status: None   Collection Time: 10/30/14  4:46 PM  Result Value Ref Range   ABO/RH(D) O POS    Antibody Screen NEG    Sample Expiration 11/02/2014   Wet prep, genital     Status: Abnormal   Collection Time: 10/30/14  6:53 PM  Result Value Ref Range   Yeast Wet Prep HPF POC MODERATE (A) NONE SEEN   Trich, Wet Prep NONE SEEN NONE SEEN   Clue Cells Wet Prep HPF POC NONE SEEN NONE SEEN   WBC, Wet Prep HPF POC FEW (A) NONE SEEN     Plan: -admit for Obs  Consulted with Dr  Sande Brothersoberts   Caitlin Bell, CNM, MSN 10/30/2014. 8:38 PM

## 2014-10-30 NOTE — MAU Note (Signed)
Pt sent from MD office, states her uc's are every 15 minutes, is 2 cm's dilated, denies bleeding or LOF.

## 2014-10-30 NOTE — MAU Provider Note (Signed)
Caitlin Bell is a 24 y.o. G3P1102 at 34.6 weeks   History     Patient Active Problem List   Diagnosis Date Noted  . Back pain complicating pregnancy in third trimester 10/14/2014    Chief Complaint  Patient presents with  . Contractions   HPI  OB History    Gravida Para Term Preterm AB TAB SAB Ectopic Multiple Living   3 2 1 1  0 0 0 0 0 2      Past Medical History  Diagnosis Date  . Cerebral palsy     mild, speech impediment  . Anxiety   . Gestational diabetes   . Depression     Past Surgical History  Procedure Laterality Date  . Cholecystectomy  2014  . Hernia repair      History reviewed. No pertinent family history.  History  Substance Use Topics  . Smoking status: Never Smoker   . Smokeless tobacco: Never Used  . Alcohol Use: Yes     Comment: ocasionally, not with the pregnancy    Allergies: No Known Allergies  Prescriptions prior to admission  Medication Sig Dispense Refill Last Dose  . acetaminophen (TYLENOL) 500 MG tablet Take 1,000 mg by mouth every 6 (six) hours as needed (cramping).    10/29/2014 at Unknown time  . diphenhydrAMINE (BENADRYL) 25 MG tablet Take 25 mg by mouth every 6 (six) hours as needed for allergies.   10/29/2014 at Unknown time  . Lurasidone HCl (LATUDA) 20 MG TABS Take 1 tablet by mouth daily.   10/29/2014 at Unknown time  . ondansetron (ZOFRAN) 4 MG tablet Take 1 tablet (4 mg total) by mouth every 8 (eight) hours as needed for nausea or vomiting. 20 tablet 0 10/30/2014 at Unknown time  . pantoprazole (PROTONIX) 20 MG tablet Take 20 mg by mouth daily.   10/30/2014 at Unknown time   Recent Results (from the past 2160 hour(s))  Urinalysis, Routine w reflex microscopic     Status: Abnormal   Collection Time: 09/14/14 11:18 AM  Result Value Ref Range   Color, Urine YELLOW YELLOW   APPearance CLEAR CLEAR   Specific Gravity, Urine 1.015 1.005 - 1.030   pH 7.0 5.0 - 8.0   Glucose, UA NEGATIVE NEGATIVE mg/dL   Hgb urine  dipstick NEGATIVE NEGATIVE   Bilirubin Urine NEGATIVE NEGATIVE   Ketones, ur NEGATIVE NEGATIVE mg/dL   Protein, ur NEGATIVE NEGATIVE mg/dL   Urobilinogen, UA 0.2 0.0 - 1.0 mg/dL   Nitrite NEGATIVE NEGATIVE   Leukocytes, UA SMALL (A) NEGATIVE  Urine microscopic-add on     Status: Abnormal   Collection Time: 09/14/14 11:18 AM  Result Value Ref Range   Squamous Epithelial / LPF FEW (A) RARE   WBC, UA 3-6 <3 WBC/hpf   Bacteria, UA FEW (A) RARE   Urine-Other MUCOUS PRESENT   Culture, OB Urine     Status: None   Collection Time: 09/14/14 11:18 AM  Result Value Ref Range   Specimen Description OB CLEAN CATCH    Special Requests NONE    Culture  Setup Time      09/14/2014 19:19 Performed at [facility]    Colony Count      35,000 COLONIES/ML Performed at [facility]    Culture      Multiple bacterial morphotypes present, none predominant. Suggest appropriate recollection if clinically indicated. Note: NO GROUP B STREP (S.AGALACTIAE) ISOLATED  Culture based screening of vaginal/anorectal swabs at  35 to [redacted] weeks gestation is required to rule out the carriage of Group B Streptococcus. Performed at [facility]    Report Status 09/15/2014 FINAL   Wet prep, genital     Status: Abnormal   Collection Time: 09/14/14 12:25 PM  Result Value Ref Range   Yeast Wet Prep HPF POC NONE SEEN NONE SEEN   Trich, Wet Prep NONE SEEN NONE SEEN   Clue Cells Wet Prep HPF POC NONE SEEN NONE SEEN   WBC, Wet Prep HPF POC MODERATE (A) NONE SEEN    Comment: FEW BACTERIA SEEN  Fetal fibronectin     Status: None   Collection Time: 09/14/14 12:25 PM  Result Value Ref Range   Fetal Fibronectin NEGATIVE NEGATIVE  Glucose, capillary     Status: None   Collection Time: 09/14/14 12:33 PM  Result Value Ref Range   Glucose-Capillary 78 70 - 99 mg/dL  Urinalysis, Routine w reflex microscopic     Status: Abnormal   Collection  Time: 10/14/14  4:16 PM  Result Value Ref Range   Color, Urine YELLOW YELLOW   APPearance HAZY (A) CLEAR   Specific Gravity, Urine 1.015 1.005 - 1.030   pH 7.5 5.0 - 8.0   Glucose, UA NEGATIVE NEGATIVE mg/dL   Hgb urine dipstick TRACE (A) NEGATIVE   Bilirubin Urine NEGATIVE NEGATIVE   Ketones, ur NEGATIVE NEGATIVE mg/dL   Protein, ur NEGATIVE NEGATIVE mg/dL   Urobilinogen, UA 0.2 0.0 - 1.0 mg/dL   Nitrite NEGATIVE NEGATIVE   Leukocytes, UA LARGE (A) NEGATIVE  Urine microscopic-add on     Status: Abnormal   Collection Time: 10/14/14  4:16 PM  Result Value Ref Range   Squamous Epithelial / LPF MANY (A) RARE   WBC, UA 11-20 <3 WBC/hpf   RBC / HPF 0-2 <3 RBC/hpf   Bacteria, UA MANY (A) RARE  Culture, OB Urine     Status: None   Collection Time: 10/14/14  4:16 PM  Result Value Ref Range   Specimen Description URINE, CLEAN CATCH    Special Requests NONE    Colony Count      >=100,000 COLONIES/ML Performed at [facility]    Culture      Multiple bacterial morphotypes present, none predominant. Suggest appropriate recollection if clinically indicated. Performed at [facility]    Report Status 10/15/2014 FINAL   Glucose, capillary     Status: None   Collection Time: 10/14/14  5:32 PM  Result Value Ref Range   Glucose-Capillary 77 70 - 99 mg/dL  Group B strep by PCR     Status: None   Collection Time: 10/30/14  4:19 PM  Result Value Ref Range   Group B strep by PCR NEGATIVE NEGATIVE  Urinalysis, Routine w reflex microscopic     Status: Abnormal   Collection Time: 10/30/14  4:21 PM  Result Value Ref Range   Color, Urine YELLOW YELLOW   APPearance CLEAR CLEAR   Specific Gravity, Urine 1.015 1.005 - 1.030   pH 7.0 5.0 - 8.0   Glucose, UA NEGATIVE NEGATIVE mg/dL   Hgb urine dipstick SMALL (A) NEGATIVE   Bilirubin Urine NEGATIVE NEGATIVE   Ketones, ur NEGATIVE NEGATIVE mg/dL   Protein, ur NEGATIVE NEGATIVE mg/dL   Urobilinogen, UA 0.2 0.0 - 1.0 mg/dL    Nitrite NEGATIVE NEGATIVE   Leukocytes, UA LARGE (A) NEGATIVE  Urine microscopic-add on     Status: Abnormal  Collection Time: 10/30/14  4:21 PM  Result Value Ref Range   Squamous Epithelial / LPF FEW (A) RARE   WBC, UA 3-6 <3 WBC/hpf   Bacteria, UA FEW (A) RARE   Urine-Other FEW YEAST   CBC     Status: Abnormal   Collection Time: 10/30/14  4:46 PM  Result Value Ref Range   WBC 11.0 (H) 4.0 - 10.5 K/uL   RBC 4.17 3.87 - 5.11 MIL/uL   Hemoglobin 11.3 (L) 12.0 - 15.0 g/dL   HCT 16.1 (L) 09.6 - 04.5 %   MCV 83.2 78.0 - 100.0 fL   MCH 27.1 26.0 - 34.0 pg   MCHC 32.6 30.0 - 36.0 g/dL   RDW 40.9 81.1 - 91.4 %   Platelets 217 150 - 400 K/uL  Type and screen     Status: None   Collection Time: 10/30/14  4:46 PM  Result Value Ref Range   ABO/RH(D) O POS    Antibody Screen NEG    Sample Expiration 11/02/2014    [place] report:  WNL, FHT 140, fluid nwl, AFI 13.22, no abruption or previa  ROS See HPI above, all other systems are negative  Physical Exam   Blood pressure 142/84, pulse 122, temperature 98.2 F (36.8 C), temperature source Oral, resp. rate 20, height  (1.626 m), weight 152 lb (68.947 kg), not currently breastfeeding.  Physical Exam Ext:  WNL ABD: Soft, non tender to palpation, no rebound or guarding SVE: unchanged, 2-3cm   ED Course  Assessment: IUP at  34.6weeks Membranes: intact FHR: Category 1 CTX:  2-3 minutes   Plan:  Labs: CBC, GC/CL, Wet prep,  Consult with Dr. Sande Brothers Faria Casella, CNM, MSN 10/30/2014. 6:22 PM

## 2014-10-30 NOTE — MAU Provider Note (Signed)
Caitlin Bell is a 24 y.o. G3P1102 at 34.6 weeks presents from the office with abd pain from ctx   History     Patient Active Problem List   Diagnosis Date Noted  . Back pain complicating pregnancy in third trimester 10/14/2014    Chief Complaint  Patient presents with  . Contractions   HPI  OB History    Gravida Para Term Preterm AB TAB SAB Ectopic Multiple Living   3 2 1 1  0 0 0 0 0 2      Past Medical History  Diagnosis Date  . Cerebral palsy     mild, speech impediment  . Anxiety   . Gestational diabetes   . Depression     Past Surgical History  Procedure Laterality Date  . Cholecystectomy  2014  . Hernia repair      No family history on file.  History  Substance Use Topics  . Smoking status: Never Smoker   . Smokeless tobacco: Never Used  . Alcohol Use: Yes     Comment: ocasionally, not with the pregnancy    Allergies: No Known Allergies  Prescriptions prior to admission  Medication Sig Dispense Refill Last Dose  . acetaminophen (TYLENOL) 500 MG tablet Take 1,000 mg by mouth every 6 (six) hours as needed.   10/14/2014 at 0600  . diphenhydrAMINE (BENADRYL) 25 MG tablet Take 25 mg by mouth every 6 (six) hours as needed for allergies.   10/13/2014 at 1900  . Lurasidone HCl (LATUDA) 20 MG TABS Take 1 tablet by mouth daily.   10/13/2014 at 2200  . ondansetron (ZOFRAN) 4 MG tablet Take 1 tablet (4 mg total) by mouth every 8 (eight) hours as needed for nausea or vomiting. 20 tablet 0 10/14/2014 at 0700  . ranitidine (ZANTAC) 150 MG tablet Take 75 mg by mouth 2 (two) times daily. Prescription is written for patient to take 1/2 tab (75mg ).   10/14/2014 at Unknown time    ROS See HPI above, all other systems are negative  Physical Exam   not currently breastfeeding.  Physical Exam Ext:  WNL ABD: Soft, non tender to palpation, no rebound or guarding SVE: 2-3/50/-3   ED Course  Assessment: IUP at  34.6weeks Membranes: FHR: Category 1 CTX:  4  minutes   Plan: US for placenta sufficiency Labs: GBS IV fluids Phenergan    Jacque Garrels, CNM, MSN 10/30/2014. 4:22 PM

## 2014-10-30 NOTE — Anesthesia Preprocedure Evaluation (Signed)
Anesthesia Evaluation  Patient identified by MRN, date of birth, ID band Patient awake    Reviewed: Allergy & Precautions, H&P , Patient's Chart, lab work & pertinent test results  Airway Mallampati: II  TM Distance: >3 FB Neck ROM: full    Dental   Pulmonary former smoker,  breath sounds clear to auscultation        Cardiovascular Rhythm:regular Rate:Normal     Neuro/Psych    GI/Hepatic   Endo/Other  diabetes  Renal/GU      Musculoskeletal   Abdominal   Peds  Hematology   Anesthesia Other Findings Cerebral palsy- mild speech impairment  Reproductive/Obstetrics (+) Pregnancy                             Anesthesia Physical Anesthesia Plan  ASA: III  Anesthesia Plan: Epidural   Post-op Pain Management:    Induction:   Airway Management Planned:   Additional Equipment:   Intra-op Plan:   Post-operative Plan:   Informed Consent: I have reviewed the patients History and Physical, chart, labs and discussed the procedure including the risks, benefits and alternatives for the proposed anesthesia with the patient or authorized representative who has indicated his/her understanding and acceptance.     Plan Discussed with:   Anesthesia Plan Comments:         Anesthesia Quick Evaluation

## 2014-10-30 NOTE — Progress Notes (Addendum)
Labor Progress  Subjective: Called to room, pt c/o increased pressure.  Pt comfortable with epidural  Objective: BP 132/85 mmHg  Pulse 115  Temp(Src) 98.2 F (36.8 C) (Oral)  Resp 20  Ht 5\' 4"  (1.626 m)  Wt 152 lb (68.947 kg)  BMI 26.08 kg/m2  LMP      FHT: 140 + accel, moderate variability, no decel CTX:  regular, every 3-4 minutes Uterus gravid, soft non tender SVE:  Dilation: 9 Effacement (%): 90 Station: -1 Exam by:: Huxley Shurley, CNM  Assessment:  IUP at 34.6 weeks NICHD: Category 1  Membranes:  BBW Labor progress: PTL GBS: negative   Plan: Continue labor plan Continuous monitoring Rest Will reassess with cervical exam at 1159or earlier if necessary    Clinton Wahlberg, CNM, MSN 10/30/2014. 10:45 PM

## 2014-10-31 ENCOUNTER — Encounter (HOSPITAL_COMMUNITY): Payer: Self-pay | Admitting: Pediatrics

## 2014-10-31 DIAGNOSIS — Z8659 Personal history of other mental and behavioral disorders: Secondary | ICD-10-CM

## 2014-10-31 DIAGNOSIS — O09219 Supervision of pregnancy with history of pre-term labor, unspecified trimester: Secondary | ICD-10-CM

## 2014-10-31 DIAGNOSIS — Z87898 Personal history of other specified conditions: Secondary | ICD-10-CM

## 2014-10-31 DIAGNOSIS — F411 Generalized anxiety disorder: Secondary | ICD-10-CM | POA: Diagnosis present

## 2014-10-31 DIAGNOSIS — G809 Cerebral palsy, unspecified: Secondary | ICD-10-CM | POA: Diagnosis present

## 2014-10-31 DIAGNOSIS — K831 Obstruction of bile duct: Secondary | ICD-10-CM | POA: Diagnosis present

## 2014-10-31 DIAGNOSIS — F1291 Cannabis use, unspecified, in remission: Secondary | ICD-10-CM

## 2014-10-31 DIAGNOSIS — O26649 Intrahepatic cholestasis of pregnancy, unspecified trimester: Secondary | ICD-10-CM | POA: Diagnosis present

## 2014-10-31 DIAGNOSIS — F319 Bipolar disorder, unspecified: Secondary | ICD-10-CM | POA: Diagnosis present

## 2014-10-31 DIAGNOSIS — Z3A35 35 weeks gestation of pregnancy: Secondary | ICD-10-CM | POA: Insufficient documentation

## 2014-10-31 DIAGNOSIS — F329 Major depressive disorder, single episode, unspecified: Secondary | ICD-10-CM | POA: Diagnosis present

## 2014-10-31 DIAGNOSIS — O24419 Gestational diabetes mellitus in pregnancy, unspecified control: Secondary | ICD-10-CM | POA: Diagnosis present

## 2014-10-31 DIAGNOSIS — O26619 Liver and biliary tract disorders in pregnancy, unspecified trimester: Secondary | ICD-10-CM

## 2014-10-31 DIAGNOSIS — O09899 Supervision of other high risk pregnancies, unspecified trimester: Secondary | ICD-10-CM

## 2014-10-31 DIAGNOSIS — K429 Umbilical hernia without obstruction or gangrene: Secondary | ICD-10-CM | POA: Diagnosis not present

## 2014-10-31 DIAGNOSIS — F32A Depression, unspecified: Secondary | ICD-10-CM | POA: Diagnosis present

## 2014-10-31 LAB — CBC
HCT: 31.2 % — ABNORMAL LOW (ref 36.0–46.0)
Hemoglobin: 10.2 g/dL — ABNORMAL LOW (ref 12.0–15.0)
MCH: 27.1 pg (ref 26.0–34.0)
MCHC: 32.7 g/dL (ref 30.0–36.0)
MCV: 82.8 fL (ref 78.0–100.0)
Platelets: 173 10*3/uL (ref 150–400)
RBC: 3.77 MIL/uL — AB (ref 3.87–5.11)
RDW: 13 % (ref 11.5–15.5)
WBC: 14 10*3/uL — ABNORMAL HIGH (ref 4.0–10.5)

## 2014-10-31 LAB — GC/CHLAMYDIA PROBE AMP (~~LOC~~) NOT AT ARMC
Chlamydia: NEGATIVE
NEISSERIA GONORRHEA: NEGATIVE

## 2014-10-31 LAB — ABO/RH: ABO/RH(D): O POS

## 2014-10-31 LAB — RPR: RPR Ser Ql: NONREACTIVE

## 2014-10-31 MED ORDER — PRENATAL MULTIVITAMIN CH
1.0000 | ORAL_TABLET | Freq: Every day | ORAL | Status: DC
  Administered 2014-10-31 – 2014-11-02 (×3): 1 via ORAL
  Filled 2014-10-31 (×3): qty 1

## 2014-10-31 MED ORDER — MEDROXYPROGESTERONE ACETATE 150 MG/ML IM SUSP: 150.0000 mg | INTRAMUSCULAR | Status: DC | PRN

## 2014-10-31 MED ORDER — ZOLPIDEM TARTRATE 5 MG PO TABS
5.0000 mg | ORAL_TABLET | Freq: Every evening | ORAL | Status: DC | PRN
  Administered 2014-11-01 (×2): 5 mg via ORAL
  Filled 2014-10-31 (×2): qty 1

## 2014-10-31 MED ORDER — SENNOSIDES-DOCUSATE SODIUM 8.6-50 MG PO TABS
2.0000 | ORAL_TABLET | ORAL | Status: DC
  Administered 2014-11-01 – 2014-11-02 (×2): 2 via ORAL
  Filled 2014-10-31 (×2): qty 2

## 2014-10-31 MED ORDER — ONDANSETRON HCL 4 MG PO TABS
4.0000 mg | ORAL_TABLET | ORAL | Status: DC | PRN
  Administered 2014-10-31 – 2014-11-02 (×2): 4 mg via ORAL
  Filled 2014-10-31 (×2): qty 1

## 2014-10-31 MED ORDER — WITCH HAZEL-GLYCERIN EX PADS: 1.0000 "application " | MEDICATED_PAD | CUTANEOUS | Status: DC | PRN

## 2014-10-31 MED ORDER — TETANUS-DIPHTH-ACELL PERTUSSIS 5-2.5-18.5 LF-MCG/0.5 IM SUSP: 0.5000 mL | Freq: Once | INTRAMUSCULAR | Status: DC

## 2014-10-31 MED ORDER — DIPHENHYDRAMINE HCL 25 MG PO CAPS: 25.0000 mg | ORAL_CAPSULE | Freq: Four times a day (QID) | ORAL | Status: DC | PRN

## 2014-10-31 MED ORDER — LURASIDONE HCL 40 MG PO TABS
20.0000 mg | ORAL_TABLET | Freq: Every day | ORAL | Status: DC
  Administered 2014-10-31 – 2014-11-02 (×3): 20 mg via ORAL
  Filled 2014-10-31 (×4): qty 1

## 2014-10-31 MED ORDER — DIBUCAINE 1 % RE OINT: 1.0000 "application " | TOPICAL_OINTMENT | RECTAL | Status: DC | PRN

## 2014-10-31 MED ORDER — OXYCODONE-ACETAMINOPHEN 5-325 MG PO TABS
1.0000 | ORAL_TABLET | ORAL | Status: DC | PRN
  Administered 2014-10-31 (×3): 1 via ORAL
  Filled 2014-10-31 (×3): qty 1

## 2014-10-31 MED ORDER — SIMETHICONE 80 MG PO CHEW: 80.0000 mg | CHEWABLE_TABLET | ORAL | Status: DC | PRN

## 2014-10-31 MED ORDER — ONDANSETRON HCL 4 MG/2ML IJ SOLN
4.0000 mg | INTRAMUSCULAR | Status: DC | PRN
  Administered 2014-10-31: 4 mg via INTRAVENOUS
  Filled 2014-10-31: qty 2

## 2014-10-31 MED ORDER — IBUPROFEN 600 MG PO TABS
600.0000 mg | ORAL_TABLET | Freq: Four times a day (QID) | ORAL | Status: DC
  Administered 2014-10-31 – 2014-11-02 (×9): 600 mg via ORAL
  Filled 2014-10-31 (×9): qty 1

## 2014-10-31 MED ORDER — LANOLIN HYDROUS EX OINT: TOPICAL_OINTMENT | CUTANEOUS | Status: DC | PRN

## 2014-10-31 MED ORDER — BENZOCAINE-MENTHOL 20-0.5 % EX AERO
1.0000 "application " | INHALATION_SPRAY | CUTANEOUS | Status: DC | PRN
  Administered 2014-10-31: 1 via TOPICAL
  Filled 2014-10-31: qty 56

## 2014-10-31 MED ORDER — OXYCODONE-ACETAMINOPHEN 5-325 MG PO TABS
2.0000 | ORAL_TABLET | ORAL | Status: DC | PRN
  Administered 2014-10-31 – 2014-11-02 (×4): 2 via ORAL
  Filled 2014-10-31 (×4): qty 2

## 2014-10-31 NOTE — Progress Notes (Signed)
Came by to see patient on day 0--she was asleep. RN reports patient doing well. Baby stable in NICU.  VSS, Afebrile.  Will continue to observe. Routine pp care. SW consult planned.  Nigel BridgemanVicki Cletis Clack, CNM 10/31/14 0900

## 2014-10-31 NOTE — Progress Notes (Signed)
Ur chart review completed.  

## 2014-10-31 NOTE — H&P (Signed)
Caitlin Bell is a 24 y.o. female, [id number] at 58 6/7 weeks, presenting from the office for back pain and contractions.  Was originally to be admitted for antenatal observation, but cervix changed from 2 cm, to 4-5 in MAU--she was therefore admitted for labor care.  Patient admitted by [facility], CNM.  No GBS testing done yet.  Patient reports the following hx:  Patient born at 25 weeks, hospitalized x 4 months.  Received several blood transfusions.   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 Per record from psychological counselor, patient reports hx of cerebral palsy. LIving at [facility] Reports bipolar dx, with hx of frequent mental health hospitalization per patient report (last one 03/2014).  Receiving services through [place] office, on [place].  Patient Active Problem List   Diagnosis Date Noted  . Normal labor 10/31/2014  . Bipolar disorder 10/31/2014  . Cholestasis of pregnancy 10/31/2014  . H/O anorexia nervosa 10/31/2014  . H/O bulimia nervosa 10/31/2014  . Gestational diabetes 10/31/2014  . Depression--on Kasandra Knudsen 10/31/2014  . Umbilical hernia 10/31/2014  . Hx of preterm delivery  10/31/2014  . Preterm delivery 10/31/2014  . Generalized anxiety disorder 10/31/2014  . History of marijuana use 10/31/2014  . Cerebral palsy 10/31/2014  . Hx of PTL (preterm labor), current pregnancy 10/30/2014  . Preterm labor 10/30/2014  . Back pain complicating pregnancy in third trimester 10/14/2014    History of present pregnancy: Patient entered care at 17 weeks.   EDC of 12/04/14  was established by LMP and [place] at 12 weeks   Anatomy scan:  19 weeks, with echogenic bowel and kidneys and an anterior placenta.   Additional [place] evaluations:   21 2/7 weeks at MFM:  Prominent fetal bowel, but not echogenic, normal kidneys, normal growth. 25 weeks:  EFW 4+2, 74%ile, vtx, normal fluid, cervix closed 26 weeks:  EFW 2 lbs, 87%ile, vtx,  normal kidneys and bowel, cervix closed. 30 6/7 weeks:  EFW 1863 gm, 74%ile, normal AFI 15.2, 55%ile. Significant prenatal events: Entered care at 17 weeks, living at [facility] [place] during pregnancy.  Early glucola was elevated at 157--patient intolerant of 3 hour GTT, given dx of GDM.  Maintained on dietary control during pregnancy.  Echogenic bowel and kidneys noted on anatomy US, with f/u at MFM--normal findings.  Seen at MAU 09/14/14 for UCs, with negative FFN.  New dx of cholestasis at 10/21/14 visit, placed on ursodial.  Had negative UDS 10/21/14.  + urine culture for Ecoli on 1/18--placed on Macrobid. Last evaluation:  10/30/14--UCs at office, cervix 2 cm.  Sent to MAU.  OB History    Gravida Para Term Preterm AB TAB SAB Ectopic Multiple Living   0 0 0 0 0 3    2010--SVB, 38 weeks, 24 hour labor, 7 lbs, female, epidural 2014--[facility]B, 36 weeks, 12 hour labor, 6+14, female, epidural, GDM, had cholecystectomy and umbilical hernia repair at 14 weeks.  Past Medical History  Diagnosis Date  . Cerebral palsy     mild, speech impediment  . Anxiety   . Gestational diabetes   . Depression    Past Surgical History  Procedure Laterality Date  . Cholecystectomy  2014  . Hernia repair     Family History: MGF CHF, HTN, asthma, DM; Mother asthma, DM; Sister asthma, DM, depression, anxiety, bipolar; MFM urinary cancer; PA alcoholism, drug use  Social History:  reports that she quit smoking about 3 years  ago. She has never used smokeless tobacco. She reports that she drinks alcohol. She reports that she does not use illicit drugs.  Hx marijuana use in early pregnancy, negative UDS 10/21/14.  Patient is Caucasian, unemployed, single, living at [facility] [place].  Follow[facility] at [facility].     Prenatal Transfer Tool  Maternal Diabetes: Yes:  Diabetes Type:  Diet controlled Genetic Screening: Normal Quad screen Maternal Ultrasounds/Referrals: Normal Fetal Ultrasounds or other Referrals:   None Maternal Substance Abuse:  Yes:  Type: Marijuana in early pregnancy, negative UDS 10/21/14 Significant Maternal Medications:  Meds include: Other: Latuda, Ursodiol Significant Maternal Lab Results: None--GBS unknown at admission  TDAP NA Flu NA  ROS:  Contractions  No Known Allergies  Chest clear Heart RRR without murmur Abd gravid, NT, FH 35 cm Pelvic: 4-5 cm, 80%, vtx, 0 station on admission to Birthing Suite Ext WNL  FHR: Category 1 UCs:  q 3-4 min, moderate  Prenatal labs: ABO, Rh: --/--/O POS, O POS (01/27 1646) Antibody: NEG (01/27 1646) Rubella:   Immune RPR: Non Reactive (01/27 1646)  HBsAg: Negative (08/27 0000)  HIV: Non-reactive (08/27 0000)  GBS: Negative (01/27 0000) Sickle cell/Hgb electrophoresis:  NA Pap:  WNL 2014 GC:  Negative 05/30/14, pending from admission Chlamydia:  Negative 05/30/14, pending from admission. Genetic screenings:  Normal Quad screen Glucola:  Elevated at 18 weeks, treated as GDM due to intolerance to 3 hour GTT Other:   Hgb 12.8 at NOB FFN negative 09/14/14 Urine culture Ecoli 10/21/14 Drug screen negative 10/21/14 Bile acids 17 10/21/14     Assessment/Plan: IUP at 34 6/7 weeks Active labor GBS unknown Hx bipolar disease, anorexia, bulimia, anxiety Social issues GDM Cholestasis  [facility] Dr. Su Hiltoberts Routine [facility] orders Pain med/epidural prn PCN G for GBS prophylaxis due to unknown status Plan SW consult pp. GBS by PCR done on admission.  Chrishonda Hesch, VICKICNM, [place] 10/31/2014, 8:02 AM

## 2014-10-31 NOTE — Progress Notes (Signed)
Clinical Social Work Department PSYCHOSOCIAL ASSESSMENT - MATERNAL/CHILD Mar 03, 2015  Patient:  Caitlin Bell, Caitlin Bell  Account Number:  [id number]  Admit Date:  08/19/2015  Caitlin Bell Name:   Caitlin Bell    Clinical Social Worker:  Terri Piedra, LCSW   Date/Time:  07-27-15 01:00 PM  Date Referred:  02/08/15   Referral source  RN     Referred reason  Homelessness   Other referral source:   NICU admission    I:  FAMILY / [place] legal guardian:  PARENT  Guardian - Name Guardian - Age Caitlin Bell [address], [place], [place] [address]  Caitlin Bell  Not in a relationship with [name]   Other household support members/support persons Name Relationship DOB  Caitlin Bell SON 1   Other support:   [name] states her friend Caitlin Bell (here visiting), her mother and sister are her main support people.    II  PSYCHOSOCIAL DATA Information Source:  Patient Interview  Occupational hygienist Employment:   Financial resources:  [employer] If Medicaid - County:  [place] Other  [place] / Grade:  Therapist, nutritional / Child Services Coordination / Early Interventions:   [place]  Cultural issues impacting care:   None stated    III  STRENGTHS Strengths  Compliance with medical plan  Home prepared for Child (including basic supplies)  Other - See comment  Supportive family/friends   Strength comment:  [name]'s 67 year old receives pediatric care at [facility].  She will call to enroll infant.   IV  RISK FACTORS AND CURRENT PROBLEMS Current Problem:  YES   Risk Factor & Current Problem Patient Issue Family Issue Risk Factor / Current Problem Comment  Mental Illness Y N [name]-Bipolar, Anxiety-on Latuda and has therapist  Housing Concerns Y N [facility] at [facility]-plans to stay in college program  Other - See comment Y N Hx marijuana use early in pregnancy  Family/Relationship Issues Y N Stressful relationship  with Caitlin Bell   N N     V  SOCIAL WORK ASSESSMENT  CSW met with [name] in her third floor room/304 to introduce myself, complete assessment due to NICU admission, Anxiety/Depression, homelessness and marijuana use and to offer support.  [name] was very pleasant and welcoming of CSW's visit.  [name] had a boisterous friend visiting with her, who introduced herself as "the baby daddy."  CSW introduced CSW support services/purpose of visit and asked [name] if this was a good time to talk with her.  CSW asked permission to talk in front of her guest.  [name] stated that this was a good time to talk and that we could talk about anything with her friend present.  [name]'s friend Caitlin Bell) explained that they met each other because Caitlin Bell used to be her boyfriend.  Caitlin Bell states Caitlin Bell cheated on her with [name] and then Caitlin Bell felt sorry for [name] because Caitlin Bell left her to care for their child (now 47 year old) by herself.  Caitlin Bell states she stepped in and took on the father role.  Therefore, she introduces herself as "baby daddy" of this child as well.  This baby has the same Caitlin Bell.  [name] agreed with all that her friend stated.  CSW asked her about Caitlin Bell's involvement at this time and she states he is involved "when he wants to be."  CSW asked how she feels about this and she replied, "I'd rather him be around all the time.  I don't think it's fair that he comes and goes."  CSW validated [name]'s feelings.  CSW asked about her other support people.  She states her mother and sister are supportive.  Her mother lives in [place] and cares for her 72 year old daughter.  [name] states that she was 32 when she gave birth to this child, so this child lives with her mother.  MGM is caring for [name]'s 79 year old while [name] is in the hospital, but [name] states he lives with her on a regular basis.  [name] lives at [facility] and plans to continue in their college program and move to the home across the street.  She is currently enrolled at [employer] for  [employer].  She states the staff have gotten her everything she needs for baby. [name] reports doing well at this point, however, began to cry when she said that her 24 year old was born at 64 weeks and was able to discharge home from the hospital at the same time she did.  CSW was surprised at this, but [name] was sure this was the case.  Therefore, [name] states she does not understand why her new baby is in the NICU.  CSW asked what information she has been given to see what she is comprehending.  She states the baby is getting oxygen and a feeding tube.  CSW helped her process this information/situation and her feeling/fears surrounding it.  CSW encouraged her to allow herself to be emotional.  [name] seemed to calm quickly.  Her friend sat on the bed and hugged her.  CSW asked how [name] feels she is coping emotionally in general and in regards to baby's admission to NICU.  At this time, [name] informed CSW that she takes Caitlin Bell and has Bipolar and Anxiety.  She then asked if she was allowed to be given Caitlin Bell while she is in the hospital.  CSW informed her that this is a medication that she can be given and asked if she has not been receiving it.  She states she has not.  (After assessment, CSW contacted V. Latham/CNM to inquire about this and request that Latuda be ordered to be given to [facility] while she is inpatient.  CNM agreed.  CSW also inquired about the hx of [name]'s reported 32 week delivery and CNM states she has received the records from [place] and the baby was born at 18 weeks.  CNM states this has been discussed with [name] during her [facility].  CSW may attempt to address this again at some point if [name] continues to have difficulty accepting baby's situation.)  [name] receives medication management from the [facility].  She states she has a therapist named Caitlin Bell at [place].   [name] received a call from [facility] while CSW was in the room.  She has an appointment for herself on 11/11/14, but did not have  anything to write the appointment down with.  [name] seemed flustered by this.  CSW heard the appointment and wrote it down for her.  [name] thanked CSW, but was almost panicked by the call.  She said, "how am I going to get formula?  They won't give me an appointment until after he gets out of the NICU and then it will take 10 days!"  [name]'s friend immediately offered to help.  [name] continued to show signs of stress.  CSW explained that [facility] is a supplemental program and there may be times  that she has to provide formula for her baby.  CSW suggested using food stamps to help supplement or allowing her friend to help her.  [name] appears easily agitated at this time.  Her friend asked if she was starting to be in pain and [name] said that she is.  Friend offered to get the bedside RN to come evaluate. CSW inquired about [name]'s hx of marijuana use and informed [name] of hospital drug screen policy.  [name] reports using marijuana on occasion for "medicinal purposes."  She states it helps her reduce her depression and anxiety symptoms and also helps with her cerebral palsy.  She states her last use was in August.  CSW asked if the medication she is on helps with her symptoms and she states that she does not think it does.  She also states, however, that the plan is to increase her dose after delivery.  CSW encouraged her to talk openly with her psychiatrist about her symptoms.  She agreed.   CSW reminded [name] of CSW's role and availability while her baby is in the NICU and provided her with contact information.  [name] seemed to appreciate CSW's visit and agrees to call if she feels she needs to process her feelings at any time.   VI SOCIAL WORK PLAN Social Work Plan  Psychosocial Support/Ongoing Assessment of Needs  Patient/Family Education   Type of pt/family education:   Ongoing support services offered by NICU CSW  Information about the NICU   If child protective services report - county:   If child protective services report  - date:   Information/referral to community resources comment:   no referral needs noted at this time.  [name] appears well linked to resources.   Other social work plan:   CSW spoke with NNP/R. Lawler to request drug screens on infant due to [name]'s hx of marijuana use in early pregnancy (documented in her PNR).  CSW will monitor drug screen results.

## 2014-10-31 NOTE — Anesthesia Postprocedure Evaluation (Signed)
Anesthesia Post Note  Patient: Caitlin Bell  Procedure(s) Performed: * No procedures listed *  Anesthesia type: Epidural  Patient location: Mother/Baby  Post pain: Pain level controlled  Post assessment: Post-op Vital signs reviewed  Last Vitals:  Filed Vitals:   10/31/14 0823  BP: 111/67  Pulse: 83  Temp: 36.6 C  Resp: 18    Post vital signs: Reviewed  Level of consciousness:alert  Complications: No apparent anesthesia complications

## 2014-10-31 NOTE — Lactation Note (Signed)
This note was copied from the chart of Caitlin Bell. Lactation Consultation Note    Initial consult with this mom of a NICU baby, now 9 hours old, and 35 weeks CGA. This is mom's third baby, but first time providing breast milk. I showed mom how to hand express, and she had easy expressed colostrum. Mom went to visit the baby, and I will do more teaching with her later. i did decrease her to 21 flanges. Mom has small breasts, feel full, with inverted nipples.  Patient Name: Caitlin Bell [id number]'s Date: 10/31/2014 Reason for consult: Initial assessment;NICU baby;Late preterm infant   Maternal Data Formula Feeding for Exclusion: Yes Reason for exclusion:  (baby in NICU) Has patient been taught Hand Expression?: Yes Does the patient have breastfeeding experience prior to this delivery?: Yes  Feeding    LATCH Score/Interventions                      Lactation Tools Discussed/Used Tools: Flanges Flange Size:  (21 flanges) WIC Program: [place] ([place] Pump Review: Setup, frequency, and cleaning;Milk Storage;Other (comment) (premie setting, reviewof NICU booklet) Initiated by:: bedside RN Date initiated:: 10/31/14   Consult Status Consult Status: Follow-up Date: 11/01/14 Follow-up type: In-patient    Caitlin Bell, Caitlin Bell 10/31/2014, 10:06 AM

## 2014-11-01 LAB — URINE CULTURE: Colony Count: 25000

## 2014-11-01 LAB — GLUCOSE, CAPILLARY: Glucose-Capillary: 74 mg/dL (ref 70–99)

## 2014-11-01 LAB — HIV ANTIBODY (ROUTINE TESTING W REFLEX): HIV Screen 4th Generation wRfx: NONREACTIVE

## 2014-11-01 NOTE — Progress Notes (Signed)
In room to assess.  Patient unavailable, assumed to be in NICU.  Will return to evaluate.    J.Latori Beggs, CNM

## 2014-11-01 NOTE — Progress Notes (Signed)
Caitlin Bell  Post Partum Day 1:S/P SVD with 1st Degree, Vaginal, and Bilateral PeriUrethral Lacerations  Subjective: Patient up ad lib, denies syncope, but reports dizziness x 2 episodes today. Reports consuming regular diet without issues and denies N/V. Denies issues with urination and reports bleeding is "fine."  Patient is attempting to breastfeed and is currently pumping as infant is in NICU. Patient reports NICU in stable condition, but is on CPaP and has feeding tube.  Patient reports that she is coping well with circumstances. Desires depo-provera for postpartum contraception prior to discharge.  Pain is being appropriately managed with use of ibuprofen.  Objective: Filed Vitals:   10/31/14 0823 10/31/14 1710 10/31/14 2133 11/01/14 0528  BP: 111/67 118/83 111/71 110/68  Pulse: 83 88 93 88  Temp: 97.9 F (36.6 C) 97.6 F (36.4 C) 97.4 F (36.3 C) 97.9 F (36.6 C)  TempSrc: Oral Oral Oral Oral  Resp: 18 18 18 16   Height:      Weight:      SpO2: 100% 98% 100% 99%    Recent Labs  10/30/14 1646 10/31/14 0512  HGB 11.3* 10.2*  HCT 34.7* 31.2*    Physical Exam:  General appearance: alert, cooperative and no distress Head: Normocephalic, without obvious abnormality, atraumatic Lungs: clear to auscultation bilaterally Heart: regular rate and rhythm Abdomen: normal findings: bowel sounds normal and soft, non-tender Pulses: 2+ and symmetric Skin: no edema and temperature normal or excoriation - scattered Lochia: Scant Laceration: Not Assessed Uterine Fundus: firm at U/-1 DVT Evaluation: No evidence of DVT seen on physical exam. No cords or calf tenderness. No significant calf/ankle edema.  Assessment S/P Vaginal Delivery-Day Normal Involution BreastFeeding/Pumping Dizziness GDM  Plan: CBG Now, then fasting in am Orthostatic VS Patient desires depo prior to discharge Plan to discharge tomorrow Continue current care Dr.  Lance MorinA. Roberts to be updated on patient  status   Caitlin Bell, Joyice FasterJESSICA LYNN, MSN, CNM 11/01/2014, 11:36 AM

## 2014-11-01 NOTE — Progress Notes (Signed)
CSW met with [name] to follow up on her emotional wellbeing today after our initial meeting yesterday.  CSW is concerned that based on assessment yesterday, [name] may be having a difficult time coping with baby's need for increased respiratory support.  [name] was by herself at this time and welcomed CSW into her room.  She states she is doing better than this morning.  CSW asked her how she was doing this morning.  [name] explained that baby had to be put on CPAP this morning.  She states she is not understanding what she is being told.  She also states that they might have to ventilate baby (in and out), which she is also unsure about, but states they have decided not to do that at this time.  CSW provided brief counseling and helped her process what she has been told and discussed how difficult it is to understand information when her stress level is high.  [name] states her mother is on the way and she "knows more about NICU staff."  She reports that she ([name]) was born at 59 weeks, so her mother has experience with this.  [name] seems happy about her mother coming.  She states FOB is also on the way.  She has sent her friend with her car to pick FOB up in [place].  She is also happy that he will be coming.  CSW recommends an update from an NNP or MD when [name]'s support people arrive.  She would very much like this.  CSW thinks it would be best if the update takes place in [name]'s third floor room to reduce distractions.  [name] states she is going "stir crazy" in her room, but that she is having trouble staying with baby for long periods of time because of her emotions.  CSW processed this with her as well and she seemed very appreciative and much more at ease at the end of the conversation.  She thanked CSW for talking with her.  CSW will talk with medical team to request an update for [name] this evening.

## 2014-11-01 NOTE — Lactation Note (Signed)
This note was copied from the chart of Caitlin MauritiusVictoria Hession. Lactation Consultation Note  Patient Name: Caitlin Bell WUJWJ'XToday's Date: 11/01/2014 Reason for consult: Follow-up assessment Baby 39 hours of life. LC visit with mom. Mom states that she is no longer interested in pumping/nursing. Mom denies any pain while pumping, just certain that she is no longer interested in nursing/lactating. Discussed that if mom changes her mind when her milk comes in she can still pump and have milk for the baby. Enc to call for assistance as needed.   Maternal Data    Feeding    LATCH Score/Interventions                      Lactation Tools Discussed/Used     Consult Status Consult Status: Complete    Geralynn OchsWILLIARD, Ernesto Zukowski 11/01/2014, 3:24 PM

## 2014-11-02 MED ORDER — OXYCODONE-ACETAMINOPHEN 5-325 MG PO TABS: 1.0000 | ORAL_TABLET | ORAL | Status: DC | PRN

## 2014-11-02 MED ORDER — FERROUS SULFATE 325 (65 FE) MG PO TBEC: 325.0000 mg | DELAYED_RELEASE_TABLET | Freq: Two times a day (BID) | ORAL | Status: DC

## 2014-11-02 NOTE — Progress Notes (Signed)
Pt discharge teaching complete. Pt understood all instructions and did not have any questions. Pt ambulated out of the hospital and discharged home to family.

## 2014-11-02 NOTE — Lactation Note (Signed)
This note was copied from the chart of Caitlin MauritiusVictoria Benn. Lactation Consultation Note  Mother has decided she would like to provide breastmilk for her baby in the NICU.  Her friend is present and encouraged her to pump.  Mom happy she is now obtaining milk.  She obtained 40-50 mls with last pumping.  [facility]  I demonstrated and gave mom a manual pump to use [facility]  Instructed to bring her pump pieces with her when coming to hospital and use [employer] pump.  Praised for her decision to provide breastmilk.    Patient Name: Caitlin Bell WUJWJ'XToday's Date: 11/02/2014     Maternal Data    Feeding Feeding Type: Formula Length of feed: 45 min  LATCH Score/Interventions                      Lactation Tools Discussed/Used     Consult Status      Huston FoleyMOULDEN, Kemya Shed S 11/02/2014, 2:22 PM

## 2014-11-02 NOTE — Discharge Summary (Signed)
Vaginal Delivery Discharge Summary  ALL information will be verified prior to discharge  Caitlin Bell  DOB:    02-02-1991 MRN:    [id number] CSN:    [id number]  Date of admission:                  10/30/14  Date of discharge:                   11/02/14  Procedures this admission: SVD  Date of Delivery: 10/21/14  Newborn Data:  Live born  Information for the patient's newborn:  Brion, Hedges [[id number]]  female  Live born female  Birth Weight: 6 lb 2.8 oz (2800 g) APGAR: 7, 8  Name: Caitlin Bell, to stay in the NICU Circumcision Plan: out   History of Present Illness: Ms. Caitlin Bell is a 24 y.o. female, [phone], who presents at [redacted]w[redacted]d weeks gestation. The patient has been followed at the [facility] division of [facility]. She was admitted onset of labor. Her pregnancy has been complicated by:  Patient Active Problem List   Diagnosis Date Noted  . Normal labor 10/31/2014  . Bipolar disorder 10/31/2014  . Cholestasis of pregnancy 10/31/2014  . H/O anorexia nervosa 10/31/2014  . H/O bulimia nervosa 10/31/2014  . Gestational diabetes 10/31/2014  . Depression--on Kasandra Knudsen 10/31/2014  . Umbilical hernia 10/31/2014  . Hx of preterm delivery  10/31/2014  . Preterm delivery 10/31/2014  . Generalized anxiety disorder 10/31/2014  . History of marijuana use 10/31/2014  . Cerebral palsy 10/31/2014  . [redacted] weeks gestation of pregnancy   . Hx of PTL (preterm labor), current pregnancy 10/30/2014  . Preterm labor 10/30/2014  . Back pain complicating pregnancy in third trimester 10/14/2014    Hospital course: The patient was admitted for labor.   Her labor was not complicated. She proceeded to have a vaginal delivery of a healthy infant. Her delivery was not complicated. Her postpartum course was not complicated. She was discharged to home on postpartum day 2 doing well.  Feeding: breast and  bottle  Contraception: Depo-Provera Pt understands the risks birth control are not limited to irregular bleeding, formation of DVT, fluid fluctuations, elevation in blood pressure, stroke, breast tenderness and liver damage.  She states she will report any serious side effects.  She has been given verbal and written instructions and voiced a clear understanding.    Discharge hemoglobin: HEMOGLOBIN  Date Value Ref Range Status  10/31/2014 10.2* 12.0 - 15.0 g/dL Final   HCT  Date Value Ref Range Status  10/31/2014 31.2* 36.0 - 46.0 % Final    PreNatal Labs  Antibody: NEG (01/27 1646) Rubella:    immune RPR: Non Reactive (01/27 1646)  HBsAg: Negative (08/27 0000)  HIV: Non-reactive (08/27 0000)  GBS: Negative (01/27 0000)  Discharge Physical Exam:  General: alert and cooperative Lochia: appropriate Uterine Fundus: firm Incision: healing well DVT Evaluation: No evidence of DVT seen on physical exam.  Intrapartum Procedures: spontaneous vaginal delivery Postpartum Procedures: none Complications-Operative and Postpartum: 1 degree perineal, 1 degree vaginal which was repair using a 3-0 vicryl on a CT needle and a superficial bilateral labial -  bilateral periurethral laceration which was repaired using a 4-0 vicryl on a SH needle with 20cc of 1% lidocaine.  Discharge Diagnoses: Premature labor  Activity:           unrestricted Diet:  routine Medications: PNV, Ibuprofen, Iron and Percocet Condition:      stable     Postpartum Teaching: Nutrition, exercise, return to work or school, family visits, sexual activity, home rest, vaginal bleeding, pelvic rest, family planning, s/s of PPD, breast care peri-care and incision care   Discharge to: home  Follow-up Information    [facility]. Schedule an appointment as soon as possible for a visit in 6 weeks.   Specialty:  Obstetrics and Gynecology   Why:  Postpartum check  up, Call with any questions or concerns   Contact information:   [address] [place] [phone]       Breshae Belcher, CNM, MSN 11/02/2014. 1:25 PM   Postpartum Care After Vaginal Delivery  After you deliver your newborn (postpartum period), the usual stay in the hospital is 24 72 hours. If there were problems with your labor or delivery, or if you have other medical problems, you might be in the hospital longer.  While you are in the hospital, you will receive help and instructions on how to care for yourself and your newborn during the postpartum period.  While you are in the hospital:  Be sure to tell your nurses if you have pain or discomfort, as well as where you feel the pain and what makes the pain worse.  If you had an incision made near your vagina (episiotomy) or if you had some tearing during delivery, the nurses may put ice packs on your episiotomy or tear. The ice packs may help to reduce the pain and swelling.  If you are breastfeeding, you may feel uncomfortable contractions of your uterus for a couple of weeks. This is normal. The contractions help your uterus get back to normal size.  It is normal to have some bleeding after delivery.  For the first 1 3 days after delivery, the flow is red and the amount may be similar to a period.  It is common for the flow to start and stop.  In the first few days, you may pass some small clots. Let your nurses know if you begin to pass large clots or your flow increases.  Do not  flush blood clots down the toilet before having the nurse look at them.  During the next 3 10 days after delivery, your flow should become more watery and pink or brown-tinged in color.  Ten to fourteen days after delivery, your flow should be a small amount of yellowish-white discharge.  The amount of your flow will decrease over the first few weeks after delivery. Your flow may stop in 6 8 weeks.  Most women have had their flow stop by 12 weeks after delivery.  You should change your sanitary pads frequently.  Wash your hands thoroughly with soap and water for at least 20 seconds after changing pads, using the toilet, or before holding or feeding your newborn.  You should feel like you need to empty your bladder within the first 6 8 hours after delivery.  In case you become weak, lightheaded, or faint, call your nurse before you get out of bed for the first time and before you take a shower for the first time.  Within the first few days after delivery, your breasts may begin to feel tender and full. This is called engorgement. Breast tenderness usually goes away within 48 72 hours after engorgement occurs. You may also notice milk leaking from your breasts. If  you are not breastfeeding, do not stimulate your breasts. Breast stimulation can make your breasts produce more milk.  Spending as much time as possible with your newborn is very important. During this time, you and your newborn can feel close and get to know each other. Having your newborn stay in your room (rooming in) will help to strengthen the bond with your newborn. It will give you time to get to know your newborn and become comfortable caring for your newborn.  Your hormones change after delivery. Sometimes the hormone changes can temporarily cause you to feel sad or tearful. These feelings should not last more than a few days. If these feelings last longer than that, you should talk to your caregiver.  If desired, talk to your caregiver about methods of family planning or contraception.  Talk to your caregiver about immunizations. Your caregiver may want you to have the following immunizations before leaving the hospital:  Tetanus, diphtheria, and pertussis (Tdap) or tetanus and diphtheria (Td) immunization. It is very important that you and your family (including grandparents) or others caring for your newborn are  up-to-date with the Tdap or Td immunizations. The Tdap or Td immunization can help protect your newborn from getting ill.  Rubella immunization.  Varicella (chickenpox) immunization.  Influenza immunization. You should receive this annual immunization if you did not receive the immunization during your pregnancy. Document Released: 07/18/2007 Document Revised: 06/14/2012 Document Reviewed: 05/17/2012 [facility] Patient Information 2014 [place], [place].   Postpartum Depression and Baby Blues  The postpartum period begins right after the birth of a baby. During this time, there is often a great amount of joy and excitement. It is also a time of considerable changes in the life of the parent(s). Regardless of how many times a mother gives birth, each child brings new challenges and dynamics to the family. It is not unusual to have feelings of excitement accompanied by confusing shifts in moods, emotions, and thoughts. All mothers are at risk of developing postpartum depression or the "baby blues." These mood changes can occur right after giving birth, or they may occur many months after giving birth. The baby blues or postpartum depression can be mild or severe. Additionally, postpartum depression can resolve rather quickly, or it can be a long-term condition. CAUSES Elevated hormones and their rapid decline are thought to be a main cause of postpartum depression and the baby blues. There are a number of hormones that radically change during and after pregnancy. Estrogen and progesterone usually decrease immediately after delivering your baby. The level of thyroid hormone and various cortisol steroids also rapidly drop. Other factors that play a major role in these changes include major life events and genetics.  RISK FACTORS If you have any of the following risks for the baby blues or postpartum depression, know what symptoms to watch out for during the postpartum period. Risk factors that may increase  the likelihood of getting the baby blues or postpartum depression include: 1. Havinga personal or family history of depression. 2. Having depression while being pregnant. 3. Having premenstrual or oral contraceptive-associated mood issues. 4. Having exceptional life stress. 5. Having marital conflict. 6. Lacking a social support network. 7. Having a baby with special needs. 8. Having health problems such as diabetes. SYMPTOMS Baby blues symptoms include:  Brief fluctuations in mood, such as going from extreme happiness to sadness.  Decreased concentration.  Difficulty sleeping.  Crying spells, tearfulness.  Irritability.  Anxiety. Postpartum depression symptoms typically begin within the first  month after giving birth. These symptoms include:  Difficulty sleeping or excessive sleepiness.  Marked weight loss.  Agitation.  Feelings of worthlessness.  Lack of interest in activity or food. Postpartum psychosis is a very concerning condition and can be dangerous. Fortunately, it is rare. Displaying any of the following symptoms is cause for immediate medical attention. Postpartum psychosis symptoms include:  Hallucinations and delusions.  Bizarre or disorganized behavior.  Confusion or disorientation. DIAGNOSIS  A diagnosis is made by an evaluation of your symptoms. There are no medical or lab tests that lead to a diagnosis, but there are various questionnaires that a caregiver may use to identify those with the baby blues, postpartum depression, or psychosis. Often times, a screening tool called the [place] Postnatal Depression Scale is used to diagnose depression in the postpartum period.  TREATMENT The baby blues usually goes away on its own in 1 to 2 weeks. Social support is often all that is needed. You should be encouraged to get adequate sleep and rest. Occasionally, you may be given medicines to help you sleep.  Postpartum depression requires treatment as it can  last several months or longer if it is not treated. Treatment may include individual or group therapy, medicine, or both to address any social, physiological, and psychological factors that may play a role in the depression. Regular exercise, a healthy diet, rest, and social support may also be strongly recommended.  Postpartum psychosis is more serious and needs treatment right away. Hospitalization is often needed. HOME CARE INSTRUCTIONS  Get as much rest as you can. Nap when the baby sleeps.  Exercise regularly. Some women find yoga and walking to be beneficial.  Eat a balanced and nourishing diet.  Do little things that you enjoy. Have a cup of tea, take a bubble bath, read your favorite magazine, or listen to your favorite music.  Avoid alcohol.  Ask for help with household chores, cooking, grocery shopping, or running errands as needed. Do not try to do everything.  Talk to people close to you about how you are feeling. Get support from your partner, family members, friends, or other new moms.  Try to stay positive in how you think. Think about the things you are grateful for.  Do not spend a lot of time alone.  Only take medicine as directed by your caregiver.  Keep all your postpartum appointments.  Let your caregiver know if you have any concerns. SEEK MEDICAL CARE IF: You are having a reaction or problems with your medicine. SEEK IMMEDIATE MEDICAL CARE IF:  You have suicidal feelings.  You feel you may harm the baby or someone else. Document Released: 06/24/2004 Document Revised: 12/13/2011 Document Reviewed: 07/27/2011 [facility] Patient Information 2014 [place], [place].     Breastfeeding Deciding to breastfeed is one of the best choices you can make for you and your baby. A change in hormones during pregnancy causes your breast tissue to grow and increases the number and size of your milk ducts. These hormones also allow proteins, sugars, and fats from your blood  supply to make breast milk in your milk-producing glands. Hormones prevent breast milk from being released before your baby is born as well as prompt milk flow after birth. Once breastfeeding has begun, thoughts of your baby, as well as his or her sucking or crying, can stimulate the release of milk from your milk-producing glands.  BENEFITS OF BREASTFEEDING For Your Baby  Your first milk (colostrum) helps your baby's digestive system function better.  There are antibodies in your milk that help your baby fight off infections.   Your baby has a lower incidence of asthma, allergies, and sudden infant death syndrome.   The nutrients in breast milk are better for your baby than infant formulas and are designed uniquely for your baby's needs.   Breast milk improves your baby's brain development.   Your baby is less likely to develop other conditions, such as childhood obesity, asthma, or type 2 diabetes mellitus.  For You   Breastfeeding helps to create a very special bond between you and your baby.   Breastfeeding is convenient. Breast milk is always available at the correct temperature and costs nothing.   Breastfeeding helps to burn calories and helps you lose the weight gained during pregnancy.   Breastfeeding makes your uterus contract to its prepregnancy size faster and slows bleeding (lochia) after you give birth.   Breastfeeding helps to lower your risk of developing type 2 diabetes mellitus, osteoporosis, and breast or ovarian cancer later in life. SIGNS THAT YOUR BABY IS HUNGRY Early Signs of Hunger  Increased alertness or activity.  Stretching.  Movement of the head from side to side.  Movement of the head and opening of the mouth when the corner of the mouth or cheek is stroked (rooting).  Increased sucking sounds, smacking lips, cooing, sighing, or squeaking.  Hand-to-mouth movements.  Increased sucking of fingers or hands. Late Signs of  Hunger  Fussing.  Intermittent crying. Extreme Signs of Hunger Signs of extreme hunger will require calming and consoling before your baby will be able to breastfeed successfully. Do not wait for the following signs of extreme hunger to occur before you initiate breastfeeding:   Restlessness.  A loud, strong cry.   Screaming.   BREASTFEEDING BASICS Breastfeeding Initiation  Find a comfortable place to sit or lie down, with your neck and back well supported.  Place a pillow or rolled up blanket under your baby to bring him or her to the level of your breast (if you are seated). Nursing pillows are specially designed to help support your arms and your baby while you breastfeed.  Make sure that your baby's abdomen is facing your abdomen.   Gently massage your breast. With your fingertips, massage from your chest wall toward your nipple in a circular motion. This encourages milk flow. You may need to continue this action during the feeding if your milk flows slowly.  Support your breast with 4 fingers underneath and your thumb above your nipple. Make sure your fingers are well away from your nipple and your baby's mouth.   Stroke your baby's lips gently with your finger or nipple.   When your baby's mouth is open wide enough, quickly bring your baby to your breast, placing your entire nipple and as much of the colored area around your nipple (areola) as possible into your baby's mouth.   More areola should be visible above your baby's upper lip than below the lower lip.   Your baby's tongue should be between his or her lower gum and your breast.   Ensure that your baby's mouth is correctly positioned around your nipple (latched). Your baby's lips should create a seal on your breast and be turned out (everted).  It is common for your baby to suck about 2-3 minutes in order to start the flow of breast milk. Latching Teaching your baby how to latch on to your breast properly  is very important. An improper latch can cause  nipple pain and decreased milk supply for you and poor weight gain in your baby. Also, if your baby is not latched onto your nipple properly, he or she may swallow some air during feeding. This can make your baby fussy. Burping your baby when you switch breasts during the feeding can help to get rid of the air. However, teaching your baby to latch on properly is still the best way to prevent fussiness from swallowing air while breastfeeding. Signs that your baby has successfully latched on to your nipple:    Silent tugging or silent sucking, without causing you pain.   Swallowing heard between every 3-4 sucks.    Muscle movement above and in front of his or her ears while sucking.  Signs that your baby has not successfully latched on to nipple:   Sucking sounds or smacking sounds from your baby while breastfeeding.  Nipple pain. If you think your baby has not latched on correctly, slip your finger into the corner of your baby's mouth to break the suction and place it between your baby's gums. Attempt breastfeeding initiation again. Signs of Successful Breastfeeding Signs from your baby:   A gradual decrease in the number of sucks or complete cessation of sucking.   Falling asleep.   Relaxation of his or her body.   Retention of a small amount of milk in his or her mouth.   Letting go of your breast by himself or herself. Signs from you:  Breasts that have increased in firmness, weight, and size 1-3 hours after feeding.   Breasts that are softer immediately after breastfeeding.  Increased milk volume, as well as a change in milk consistency and color by the fifth day of breastfeeding.   Nipples that are not sore, cracked, or bleeding. Signs That Your Pecola Leisure is Getting Enough Milk  Wetting at least 3 diapers in a 24-hour period. The urine should be clear and pale yellow by age 64 days.  At least 3 stools in a 24-hour period  by age 64 days. The stool should be soft and yellow.  At least 3 stools in a 24-hour period by age 69 days. The stool should be seedy and yellow.  No loss of weight greater than 10% of birth weight during the first 68 days of age.  Average weight gain of 4-7 ounces (113-198 g) per week after age 70 days.  Consistent daily weight gain by age 64 days, without weight loss after the age of 2 weeks. After a feeding, your baby may spit up a small amount. This is common. BREASTFEEDING FREQUENCY AND DURATION Frequent feeding will help you make more milk and can prevent sore nipples and breast engorgement. Breastfeed when you feel the need to reduce the fullness of your breasts or when your baby shows signs of hunger. This is called "breastfeeding on demand." Avoid introducing a pacifier to your baby while you are working to establish breastfeeding (the first 4-6 weeks after your baby is born). After this time you may choose to use a pacifier. Research has shown that pacifier use during the first year of a baby's life decreases the risk of sudden infant death syndrome (SIDS). Allow your baby to feed on each breast as long as he or she wants. Breastfeed until your baby is finished feeding. When your baby unlatches or falls asleep while feeding from the first breast, offer the second breast. Because newborns are often sleepy in the first few weeks of life, you may need to awaken  your baby to get him or her to feed. Breastfeeding times will vary from baby to baby. However, the following rules can serve as a guide to help you ensure that your baby is properly fed:  Newborns (babies 53 weeks of age or younger) may breastfeed every 1-3 hours.  Newborns should not go longer than 3 hours during the day or 5 hours during the night without breastfeeding.  You should breastfeed your baby a minimum of 8 times in a 24-hour period until you begin to introduce solid foods to your baby at around 70 months of age. BREAST MILK  PUMPING Pumping and storing breast milk allows you to ensure that your baby is exclusively fed your breast milk, even at times when you are unable to breastfeed. This is especially important if you are going back to work while you are still breastfeeding or when you are not able to be present during feedings. Your lactation consultant can give you guidelines on how long it is safe to store breast milk.  A breast pump is a machine that allows you to pump milk from your breast into a sterile bottle. The pumped breast milk can then be stored in a refrigerator or freezer. Some breast pumps are operated by hand, while others use electricity. Ask your lactation consultant which type will work best for you. Breast pumps can be purchased, but some hospitals and breastfeeding support groups lease breast pumps on a monthly basis. A lactation consultant can teach you how to hand express breast milk, if you prefer not to use a pump.  CARING FOR YOUR BREASTS WHILE YOU BREASTFEED Nipples can become dry, cracked, and sore while breastfeeding. The following recommendations can help keep your breasts moisturized and healthy:  Avoid using soap on your nipples.   Wear a supportive bra. Although not required, special nursing bras and tank tops are designed to allow access to your breasts for breastfeeding without taking off your entire bra or top. Avoid wearing underwire-style bras or extremely tight bras.  Air dry your nipples for 3-74minutes after each feeding.   Use only cotton bra pads to absorb leaked breast milk. Leaking of breast milk between feedings is normal.   Use lanolin on your nipples after breastfeeding. Lanolin helps to maintain your skin's normal moisture barrier. If you use pure lanolin, you do not need to wash it off before feeding your baby again. Pure lanolin is not toxic to your baby. You may also hand express a few drops of breast milk and gently massage that milk into your nipples and allow the  milk to air dry. In the first few weeks after giving birth, some women experience extremely full breasts (engorgement). Engorgement can make your breasts feel heavy, warm, and tender to the touch. Engorgement peaks within 3-5 days after you give birth. The following recommendations can help ease engorgement:  Completely empty your breasts while breastfeeding or pumping. You may want to start by applying warm, moist heat (in the shower or with warm water-soaked hand towels) just before feeding or pumping. This increases circulation and helps the milk flow. If your baby does not completely empty your breasts while breastfeeding, pump any extra milk after he or she is finished.  Wear a snug bra (nursing or regular) or tank top for 1-2 days to signal your body to slightly decrease milk production.  Apply ice packs to your breasts, unless this is too uncomfortable for you.  Make sure that your baby is latched on and  positioned properly while breastfeeding. If engorgement persists after 48 hours of following these recommendations, contact your health care provider or a Advertising copywriter. OVERALL HEALTH CARE RECOMMENDATIONS WHILE BREASTFEEDING  Eat healthy foods. Alternate between meals and snacks, eating 3 of each per day. Because what you eat affects your breast milk, some of the foods may make your baby more irritable than usual. Avoid eating these foods if you are sure that they are negatively affecting your baby.  Drink milk, fruit juice, and water to satisfy your thirst (about 10 glasses a day).   Rest often, relax, and continue to take your prenatal vitamins to prevent fatigue, stress, and anemia.  Continue breast self-awareness checks.  Avoid chewing and smoking tobacco.  Avoid alcohol and drug use. Some medicines that may be harmful to your baby can pass through breast milk. It is important to ask your health care provider before taking any medicine, including all over-the-counter and  prescription medicine as well as vitamin and herbal supplements. It is possible to become pregnant while breastfeeding. If birth control is desired, ask your health care provider about options that will be safe for your baby. SEEK MEDICAL CARE IF:   You feel like you want to stop breastfeeding or have become frustrated with breastfeeding.  You have painful breasts or nipples.  Your nipples are cracked or bleeding.  Your breasts are red, tender, or warm.  You have a swollen area on either breast.  You have a fever or chills.  You have nausea or vomiting.  You have drainage other than breast milk from your nipples.  Your breasts do not become full before feedings by the fifth day after you give birth.  You feel sad and depressed.  Your baby is too sleepy to eat well.  Your baby is having trouble sleeping.   Your baby is wetting less than 3 diapers in a 24-hour period.  Your baby has less than 3 stools in a 24-hour period.  Your baby's skin or the white part of his or her eyes becomes yellow.   Your baby is not gaining weight by 41 days of age. SEEK IMMEDIATE MEDICAL CARE IF:   Your baby is overly tired (lethargic) and does not want to wake up and feed.  Your baby develops an unexplained fever. Document Released: 09/20/2005 Document Revised: 09/25/2013 Document Reviewed: 03/14/2013 [facility] Patient Information 2015 [place], [place]. This information is not intended to replace advice given to you by your health care provider. Make sure you discuss any questions you have with your health care provider.

## 2014-11-07 ENCOUNTER — Ambulatory Visit: Payer: Self-pay

## 2014-11-07 NOTE — Lactation Note (Signed)
This note was copied from the chart of Caitlin MauritiusVictoria Kundert. 90Lactation Consultation Note  Patient Name: Caitlin Bell ZOXWR'UToday's Date: 11/07/2014  Baby 7 days of life. Mom in NICU had questions about providing EBM while she is on "JordanLatuda." Printed out copy of information regarding compatibility from [employer], "Medications and Mother's Milk." Reviewed sheet with baby's nurse and then reviewed and gave copy to [name]. Discussed with [name] and baby's NICU RN, Lanora Manislizabeth, that baby's NICU provider should be consulted regarding EBM for baby with mom on "Latuda." Discussed with mom that it is important for her to take her medication as prescribed by her doctor. Mom also had a question about her milk supply. Discussed supply and demand and the need to pump every 3 hours. Mom states that she has been waiting longer in between pumping and her supply is decreasing. Mom also had question about her inverted nipple and nursing her baby. Assured mom that we can fit her with a NS as needed when baby able to go to breast. Enc mom to call [facility] for assist as needed.   Maternal Data    Feeding Feeding Type: Breast Milk Nipple Type: Regular Length of feed: 45 min  LATCH Score/Interventions                      Lactation Tools Discussed/Used     Consult Status      Geralynn OchsWILLIARD, Elfie Costanza 11/07/2014, 5:27 PM

## 2014-11-08 ENCOUNTER — Ambulatory Visit: Payer: Self-pay

## 2014-11-08 NOTE — Lactation Note (Signed)
This note was copied from the chart of Caitlin MauritiusVictoria Schinke. Lactation Consultation Note     Follow up consult with this mom of a NICU baby, now 698 days old, and 36 1/7 weeks CGA. The baby is taking both bottle and ng feedings. I ws going to assist mom with latching the baby, but he has oral thrush. I told mom I would rather wait until this clears, since we have time to transition him to the breast. I do not want mom to get a breast yeast infection. Mom agreed. Mom is doing well with her milk supply - expressing almost 60 mls at this time. Mom had decided not to pump, until her milk transitioned in, and a family member convinced her to begin pumping. Mom knows to call for questions/concerns.   Patient Name: Caitlin Bell BJYNW'GToday's Date: 11/08/2014 Reason for consult: Follow-up assessment   Maternal Data    Feeding Feeding Type: Formula Nipple Type: Slow - flow Length of feed: 30 min  LATCH Score/Interventions                      Lactation Tools Discussed/Used     Consult Status Consult Status: PRN Follow-up type: In-patient (NICU)    Alfred LevinsLee, Jalynn Betzold Anne 11/08/2014, 5:08 PM

## 2014-11-13 ENCOUNTER — Ambulatory Visit: Payer: Self-pay

## 2014-11-13 NOTE — Lactation Note (Addendum)
This note was copied from the chart of [place]. Lactation Consultation Note  Patient Name: Caitlin Bell CBULA'G Date: 11/13/2014 Reason for consult: Follow-up assessment Baby 13 days of life. Mom reports that baby will be discharged from NICU tomorrow. Inquired with mom about her supply and how pumping is going. Mom states that she just wants to offer formula for now because she has been pumping but her supply is decreasing. Mom initially states that she has been pumping every 2-3 hours; however, she did state that she misplaced her tubing 2 days ago and was just using her hand pump occasionally. Discussed with mom that Rosenhayn wanting to support her feeding decision and assist as needed. Offered mom another DEBP kit to use for the night, but mom declined. Mom grateful for assistance and excited about rooming in for the night with the baby.   Maternal Data    Feeding    LATCH Score/Interventions                      Lactation Tools Discussed/Used     Consult Status Consult Status: Complete    Inocente Salles 11/13/2014, 3:49 PM

## 2015-07-24 ENCOUNTER — Emergency Department (HOSPITAL_COMMUNITY)
Admission: EM | Admit: 2015-07-24 | Discharge: 2015-07-24 | Disposition: A | Discharge status: Home / Self Care | Payer: Medicaid Other | Attending: Emergency Medicine | Admitting: Emergency Medicine

## 2015-07-24 ENCOUNTER — Encounter (HOSPITAL_COMMUNITY): Payer: Self-pay | Admitting: Emergency Medicine

## 2015-07-24 DIAGNOSIS — Z8669 Personal history of other diseases of the nervous system and sense organs: Secondary | ICD-10-CM | POA: Diagnosis not present

## 2015-07-24 DIAGNOSIS — O212 Late vomiting of pregnancy: Secondary | ICD-10-CM | POA: Insufficient documentation

## 2015-07-24 DIAGNOSIS — O99342 Other mental disorders complicating pregnancy, second trimester: Secondary | ICD-10-CM | POA: Insufficient documentation

## 2015-07-24 DIAGNOSIS — Z79899 Other long term (current) drug therapy: Secondary | ICD-10-CM | POA: Diagnosis not present

## 2015-07-24 DIAGNOSIS — Z8632 Personal history of gestational diabetes: Secondary | ICD-10-CM | POA: Diagnosis not present

## 2015-07-24 DIAGNOSIS — O9989 Other specified diseases and conditions complicating pregnancy, childbirth and the puerperium: Secondary | ICD-10-CM | POA: Diagnosis present

## 2015-07-24 DIAGNOSIS — O219 Vomiting of pregnancy, unspecified: Secondary | ICD-10-CM

## 2015-07-24 DIAGNOSIS — R000 Tachycardia, unspecified: Secondary | ICD-10-CM | POA: Diagnosis not present

## 2015-07-24 DIAGNOSIS — Z87891 Personal history of nicotine dependence: Secondary | ICD-10-CM | POA: Diagnosis not present

## 2015-07-24 DIAGNOSIS — R42 Dizziness and giddiness: Secondary | ICD-10-CM | POA: Insufficient documentation

## 2015-07-24 DIAGNOSIS — F329 Major depressive disorder, single episode, unspecified: Secondary | ICD-10-CM | POA: Insufficient documentation

## 2015-07-24 DIAGNOSIS — F419 Anxiety disorder, unspecified: Secondary | ICD-10-CM | POA: Diagnosis not present

## 2015-07-24 LAB — URINALYSIS, ROUTINE W REFLEX MICROSCOPIC
Bilirubin Urine: NEGATIVE
Glucose, UA: NEGATIVE mg/dL
Hgb urine dipstick: NEGATIVE
KETONES UR: NEGATIVE mg/dL
Nitrite: NEGATIVE
Protein, ur: NEGATIVE mg/dL
Specific Gravity, Urine: 1.01 (ref 1.005–1.030)
Urobilinogen, UA: 0.2 mg/dL (ref 0.0–1.0)
pH: 7 (ref 5.0–8.0)

## 2015-07-24 LAB — URINE MICROSCOPIC-ADD ON

## 2015-07-24 MED ORDER — DIPHENHYDRAMINE HCL 50 MG/ML IJ SOLN
25.0000 mg | Freq: Once | INTRAMUSCULAR | Status: DC
  Filled 2015-07-24: qty 1

## 2015-07-24 MED ORDER — PROMETHAZINE HCL 12.5 MG PO TABS: 25.0000 mg | ORAL_TABLET | Freq: Once | ORAL | Status: DC

## 2015-07-24 MED ORDER — METOCLOPRAMIDE HCL 5 MG/ML IJ SOLN
5.0000 mg | Freq: Once | INTRAMUSCULAR | Status: AC
  Administered 2015-07-24: 5 mg via INTRAVENOUS
  Filled 2015-07-24: qty 2

## 2015-07-24 MED ORDER — SODIUM CHLORIDE 0.9 % IV BOLUS (SEPSIS)
1000.0000 mL | Freq: Once | INTRAVENOUS | Status: AC
  Administered 2015-07-24: 1000 mL via INTRAVENOUS

## 2015-07-24 MED ORDER — METOCLOPRAMIDE HCL 5 MG PO TABS: 5.0000 mg | ORAL_TABLET | Freq: Four times a day (QID) | ORAL | Status: DC | PRN

## 2015-07-24 NOTE — ED Notes (Signed)
Patient states she is [redacted] weeks pregnant and was at a class when she became dizzy. Also complaining of nausea and shortness of breath. States she was recently put on phenergan for nausea and anxiety medication this week.

## 2015-07-24 NOTE — ED Notes (Signed)
Lorene Dyhristie RN from [facility] called and stated the attending has cleared the patient from [facility]. States patient can be removed from fetal monitor.

## 2015-07-24 NOTE — Discharge Instructions (Signed)
°Emergency Department Resource Guide °1) Find a Doctor and Pay Out of Pocket °Although you won't have to find out who is covered by your insurance plan, it is a good idea to ask around and get recommendations. You will then need to call the office and see if the doctor you have chosen will accept you as a new patient and what types of options they offer for patients who are self-pay. Some doctors offer discounts or will set up payment plans for their patients who do not have insurance, but you will need to ask so you aren't surprised when you get to your appointment. ° °2) Contact Your Local Health Department °Not all health departments have doctors that can see patients for sick visits, but many do, so it is worth a call to see if yours does. If you don't know where your local health department is, you can check in your phone book. The CDC also has a tool to help you locate your state's health department, and many state websites also have listings of all of their local health departments. ° °3) Find a Walk-in Clinic °If your illness is not likely to be very severe or complicated, you may want to try a walk in clinic. These are popping up all over the country in pharmacies, drugstores, and shopping centers. They're usually staffed by nurse practitioners or physician assistants that have been trained to treat common illnesses and complaints. They're usually fairly quick and inexpensive. However, if you have serious medical issues or chronic medical problems, these are probably not your best option. ° °No Primary Care Doctor: °- Call Health Connect at  832-8000 - they can help you locate a primary care doctor that  accepts your insurance, provides certain services, etc. °- Physician Referral Service- 1-800-533-3463 ° °Chronic Pain Problems: °Organization         Address  Phone   Notes  °[facility]  (336) 297-2271 Patients need to be referred by their primary care doctor.  ° °Medication  Assistance: °Organization         Address  Phone   Notes  °[place] [facility] [address] °[place], [place] [address] (336) 641-8030 --Must be a resident of [place] °-- Must have NO insurance coverage whatsoever (no Medicaid/ Medicare, etc.) °-- The pt. MUST have a primary care doctor that directs their care regularly and follows them in the community °  °[facility]  (866) 331-1348   °United Way  (888) 892-1162   ° °Agencies that provide inexpensive medical care: °Organization         Address  Phone   Notes  °[facility]  (336) 832-8035   °[facility]    (336) 832-7272   °[facility] [address] °[place], [place] [address] (336) 832-4777   °[facility] [place] [address], °[place] (336) 271-4999   °[facility]    (336) 373-0678   °[facility]    (336) 272-1050   °[facility] ° [address], [place] Phone:  (336) 832-4444, Fax:  (336) 832-4440 Hours of Operation:  9 am - 6 pm, M-F.  Also accepts Medicaid/Medicare and self-pay.  °[facility] ° [address], [place] Phone: (336) 832-3150, Fax: (336) [phone]. Hours of Operation:  8:30 am - 5:30 pm, M-F.  Also accepts Medicaid and self-pay.  °[facility] [place] [address], [place] Phone: (336) 878-6027   °[facility] [address], [place], [place] (336)723-1848, Ext. 123 Mondays & Thursdays: 7-9 AM.  First 15 patients are seen on a first come, first serve basis. °  ° °Medicaid-accepting [place] Providers: ° °Organization         Address  Phone   Notes  °[facility] [address], [place] (336) 641-2100 Also accepts self-pay patients.  °[facility] [address], [place] ° (336) 856-9996   °[facility] [address], [place]  (336) 288-8857   °[facility] [address] [place] [address], [place] (336) 299-7000   °Veita Bland [address], [place]  ° (336) 373-1557 Only accepts [place] Access Medicaid patients after they have their name applied to their card.  ° °Self-Pay (no insurance) in [place]: ° °Organization         Address  Phone   Notes  °Sickle Cell Patients, [facility] [address], [place] (336) 832-1970   °[facility] [address], [place] (336) 832-4400   °[facility] ° [address], [place] (336) 992-4800   °[facility]/Dr. Osei-Bonsu ° [address] [place] [address], [place] or [address], [place] (336) 841-8500 Phone number for both [place] and [place] locations is the same.  °[facility] [address], [place] (336) 299-0000   °[facility] [address] [place] [address], [place] or [address] (336) 852-7530 °(336) 878-2260   °[facility] [address], [place] (336) 350-1642, phone; (336) 294-5005, fax Sees patients 1st and 3rd Saturday of every month.  Must not qualify for public or private insurance (i.e. Medicaid, Medicare, [place] Health Choice, Veterans' Benefits) • Household income should be no more than 200% of the poverty level •The clinic cannot treat you if you are pregnant or think you are pregnant • Sexually transmitted diseases are not treated at the clinic.  ° ° °Dental Care: °Organization         Address  Phone  Notes  °[place] [facility] [address], [place] (336) 641-6152 Accepts children up to age 21 who are enrolled in Medicaid or [place] Health Choice; pregnant women with a Medicaid card; and children who have applied for Medicaid or [place] Health Choice, but were declined, whose parents can pay a reduced fee at time of service.  °[place]  [facility] [place]  [address], [place] (336) 641-7733 Accepts children up to age 21 who are enrolled in Medicaid or [place] Health Choice; pregnant women with a Medicaid card; and children who have applied for Medicaid or [place] Health Choice, but were declined, whose parents can pay a reduced fee at time of service.  °[facility] ° [address], [place] (336) 641-4533 Patients are seen by appointment only. Walk-ins are not accepted. [facility] will see patients 18 years of age and older. °Monday - Tuesday (8am-5pm) °Most Wednesdays (8:30-5pm) °$30 per visit, cash only  °[facility] ° [address], [place] (336) 641-4533 Patients are seen by appointment only. Walk-ins are not accepted. [facility] will see patients 18 years of age and older. °One   Wednesday Evening (Monthly: Volunteer Based).  $30 per visit, cash only  °[facility]  (919) 537-3737 for adults; Children under age 4, call Graduate Pediatric Dentistry at (919) [phone]. Children aged 4-14, please call (919) [phone] to request a pediatric application. ° Dental services are provided in all areas of dental care including fillings, crowns and bridges, complete and partial dentures, implants, gum treatment, root canals, and extractions. Preventive care is also provided. Treatment is provided to both adults and children. °Patients are selected via a lottery and there is often a waiting list. °  °[facility] [address], °[place] ° (336) 763-8833 [email or web address] °  °[facility] [address], [place], [place] (336)723-1848, Ext. 123 Second and Fourth Thursday of each month, opens at 6:30 AM; Clinic ends at 9 AM.  Patients are seen on a first-come first-served basis, and a limited number are seen during each clinic.  ° °[facility] ° [address], [place], [place] (336) 723-7904    Eligibility Requirements °You must have lived in [place], [place], or [place] counties for at least the last three months. °  You cannot be eligible for state or federal sponsored healthcare insurance, including Veterans Administration, Medicaid, or Medicare. °  You generally cannot be eligible for healthcare insurance through your employer.  °  How to apply: °Eligibility screenings are held every Tuesday and Wednesday afternoon from 1:00 pm until 4:00 pm. You do not need an appointment for the interview!  °[facility] [address], [place], [place] 336-631-2330   °[facility] [facility]  336-342-8273   °[facility]  336-703-3100   °[facility]  336-570-6415   ° °Behavioral Health Resources in the Community: °Intensive Outpatient Programs °Organization         Address  Phone  Notes  °[facility] [address], [place], [place] 336-878-6098   °[facility] [address], [place], [place] 336-832-9800   °[facility] [address], [place], [place] ° 336-882-2125   °[facility] [address],  °[place], [place] 1-800-853-5163 or 336-641-4981   °Substance Abuse Resources °Organization         Address  Phone  Notes  °[facility]  336-882-2125   °[facility]  336-784-9470   °[facility]  336-285-9073   °[facility]  336-845-3988   °Residential & Outpatient Substance Abuse Program  1-800-659-3381   °Psychological Services °Organization         Address  Phone  Notes  °[facility]  336- 832-9600   °[facility]  336- 378-7881   °[facility] [address], [place] 1-800-853-5163 or 336-641-4981   ° °Mobile Crisis Teams °Organization         Address  Phone  Notes  °[facility], Mobile Crisis Care Unit  1-877-626-1772   °Assertive °[facility] ° [address]  [place], [place] 336-834-9664   °Sharon DeEsch [address] °[place] 336-554-5454   ° °Self-Help/Support Groups °Organization         Address  Phone             Notes  °[facility]oc[facility] of  - variety of support groups  336- 373-1402 Call for more information  °Narcotics Anonymous (NA), [facility] [address], °[place]  2 meetings at this location  ° °  Residential Treatment Programs Organization         Address  Phone  Notes  [facility] [address],    [place]  [phone]   [facility]  [address], [place] [phone], [place], [place] [phone](505)[phone]   [facility] [address], [place] [phone] Admissions: 8am-3pm M-F  [facility] [address]    [place], [place] [phone]   [facility] [address], [place], [place] [phone]   [facility] [address]  [place], [place] [phone]   [facility] Intensive Outpatient [address], [place], [place] [phone](205) [phone]   [facility] ([facility] [address]  [place], [place] [phone]-(339)[phone] or (816)412-0066(223) [phone]   [facility] ([facility] [address] [place], [place] [phone]([phone])[phone] Accepts Medicaid  [facility] [address]  [place] [phone] Substance Abuse/Addiction Treatment   [facility] Resources Organization         Address  Phone  Notes  [facility]  [phone](888) (305) 868-9181   Angie FavaJulie Brannon, PhD [address], [address] [place], [place]   [phone](336) (725)[phone] or [phone](336) [phone]   [facility]   [address] [place], [place] [phone](336) [phone]   [facility] [address], [place], [place] [phone](336) (506) [phone] Insurance/Medicaid/sponsorship through [facility] [address]                                    [place], [place] [phone](336) ([phone] Therapy/tele-psych/case    [facility] [address]   [place], [place] [phone](336) [phone]    Dr. Lolly MustacheArfeen  [phone]) [phone]   [facility] [place]  [employer] [facility] 1) [address], [place] 2) [address], [place] 3)  [address], [place] ([phone]) [phone](336) [phone](336) [phone]  [phone](336) [phone]   [facility] Child Abuse Hot[facility] (907)654-9046(336) (514)449-9334 or 346-884-0979(336) [phone] (After Hours)      Stop taking the phenergan and take the new prescription as directed. Increase your fluid intake for the next several days. Call your regular OB/GYN doctor tomorrow to schedule a follow up appointment within the next 2 days.  Return to the Emergency Department immediately sooner if worsening.

## 2015-07-24 NOTE — ED Provider Notes (Signed)
Pt received at sign out with Udip and PO challenge pending. Pt has received IVF bolus, IV reglan. Pt states she "feels much better now" and wants to go home. Requesting rx reglan to use instead of phenergan. Has tol PO well without N/V while in the ED. Abd remains benign. [facility] has evaluated toco/FHM: reassuring and discontinued. Dx and testing d/w pt.  Questions answered.  Verb understanding, agreeable to d/c home with outpt f/u.   Results for orders placed or performed during the hospital encounter of 07/24/15  Urinalysis, Routine w reflex microscopic (not at [facility])  Result Value Ref Range   Color, Urine YELLOW YELLOW   APPearance CLEAR CLEAR   Specific Gravity, Urine 1.010 1.005 - 1.030   pH 7.0 5.0 - 8.0   Glucose, UA NEGATIVE NEGATIVE mg/dL   Hgb urine dipstick NEGATIVE NEGATIVE   Bilirubin Urine NEGATIVE NEGATIVE   Ketones, ur NEGATIVE NEGATIVE mg/dL   Protein, ur NEGATIVE NEGATIVE mg/dL   Urobilinogen, UA 0.2 0.0 - 1.0 mg/dL   Nitrite NEGATIVE NEGATIVE   Leukocytes, UA TRACE (A) NEGATIVE  Urine microscopic-add on  Result Value Ref Range   Squamous Epithelial / LPF FEW (A) RARE   WBC, UA 0-2 <3 WBC/hpf   Bacteria, UA RARE RARE       Samuel JesterKathleen Marchella [place], DO 07/24/15 2305

## 2015-07-24 NOTE — ED Notes (Signed)
Pt given sprite to drink by Dr. Clarene DukeMcManus

## 2015-07-24 NOTE — ED Provider Notes (Signed)
CSN: [id number]     Arrival date & time 07/24/15  2010 History  By signing my name below, I, Elon SpannerGarrett Cook, attest that this documentation has been prepared under the direction and in the presence of Melene Planan Evelene Roussin, DO. Electronically Signed: Elon SpannerGarrett Cook, ED Scribe. 07/24/2015. 8:35 PM.    Chief Complaint  Patient presents with  . Dizziness  . Routine Prenatal Visit   The history is provided by the patient. No language interpreter was used.   HPI Comments: Margrett RudVictoria L Bronkema is a 24 y.o. 2427 week pregnant, 14P3A0 female with no complications who presents to the Emergency Department complaining of lightheadedness onset 4:30 today.  Associated symptoms include headache as well as nausea throughout the pregnancy, worsened by eating/drinking and treated with phenergan.  She also reports intermittent vomiting after eating normal to baseline throughout her pregnancy.  She has been able to tolerate small amounts of food such as crackers, but has had overall decreased oral intake today due to nausea.  The patient reports starting citalopram two days ago and has had two doses since.  Patient dreports a hx of preeclampsia with two prior pregnancies but not her current pregnancy.  She denies other significant hx or pregnancy complications.  She denies diarrhea, dysuria, frequency, abdominal cramping, vaginal bleeding.     Patient is followed by OB/GYN currently with a normal ultrasound.   Past Medical History  Diagnosis Date  . Cerebral palsy (HCC)     mild, speech impediment  . Anxiety   . Gestational diabetes   . Depression    Past Surgical History  Procedure Laterality Date  . Cholecystectomy  2014  . Hernia repair     History reviewed. No pertinent family history. Social History  Substance Use Topics  . Smoking status: Former Smoker    Quit date: 10/31/2011  . Smokeless tobacco: Never Used  . Alcohol Use: Yes     Comment: ocasionally, not with the pregnancy   OB History    Gravida Para  Term Preterm AB TAB SAB Ectopic Multiple Living   4 3 1 2  0 0 0 0 0 3     Review of Systems  Constitutional: Negative for fever and chills.  HENT: Negative for congestion and rhinorrhea.   Eyes: Negative for redness and visual disturbance.  Respiratory: Negative for shortness of breath and wheezing.   Cardiovascular: Negative for chest pain and palpitations.  Gastrointestinal: Positive for nausea and vomiting.  Genitourinary: Negative for dysuria and urgency.  Musculoskeletal: Negative for myalgias and arthralgias.  Skin: Negative for pallor and wound.  Neurological: Negative for dizziness and headaches.   A complete 10 system review of systems was obtained and all systems are negative except as noted in the HPI and PMH.   Allergies  Review of patient's allergies indicates no known allergies.  Home Medications   Prior to Admission medications   Medication Sig Start Date End Date Taking? Authorizing Provider  CITALOPRAM HYDROBROMIDE PO Take 1 tablet by mouth daily.   Yes Historical Provider, MD  metoCLOPramide (REGLAN) 5 MG tablet Take 1 tablet (5 mg total) by mouth every 6 (six) hours as needed for nausea or vomiting. 07/24/15   Samuel JesterKathleen McManus, DO   BP 109/77 mmHg  Pulse 86  Temp(Src) 98.1 F (36.7 C) (Oral)  Resp 20  Ht 5\' 4"  (1.626 m)  Wt 142 lb (64.411 kg)  BMI 24.36 kg/m2  SpO2 100% Physical Exam  Constitutional: She is oriented to person, place, and time. She appears  well-developed and well-nourished. No distress.  Gravid.    HENT:  Head: Normocephalic and atraumatic.  Eyes: Conjunctivae and EOM are normal.  Neck: Neck supple. No tracheal deviation present.  Cardiovascular: Normal rate.   Tachycardic.   Pulmonary/Chest: Effort normal. No respiratory distress.  CTA bilaterally.   Abdominal: There is no tenderness. There is no rebound and no guarding.  Top of the fundus is four fingers above umbilicus.   Musculoskeletal: Normal range of motion. She exhibits no  edema or tenderness.  No edema.   Neurological: She is alert and oriented to person, place, and time.  Skin: Skin is warm and dry.  Psychiatric: She has a normal mood and affect. Her behavior is normal.  Nursing note and vitals reviewed.   ED Course  Procedures (including critical care time)  DIAGNOSTIC STUDIES: Oxygen Saturation is 100% on RA, normal by my interpretation.    COORDINATION OF CARE:  8:34 PM Will order IV fluids, anti-emetic, and UA.  Patient acknowledges and agrees with plan.    Labs Review Labs Reviewed  URINALYSIS, ROUTINE W REFLEX MICROSCOPIC (NOT AT [facility]) - Abnormal; Notable for the following:    Leukocytes, UA TRACE (*)    All other components within normal limits  URINE MICROSCOPIC-ADD ON - Abnormal; Notable for the following:    Squamous Epithelial / LPF FEW (*)    All other components within normal limits  URINE CULTURE    Imaging Review No results found.   EKG Interpretation   Date/Time:  Thursday July 24 2015 20:35:43 EDT Ventricular Rate:  78 PR Interval:  148 QRS Duration: 79 QT Interval:  379 QTC Calculation: 432 R Axis:   68 Text Interpretation:  Sinus rhythm No old tracing to compare Confirmed by  Bana Borgmeyer MD, DANIEL [id number]) on 07/24/2015 9:05:50 PM      MDM   Final diagnoses:  Nausea and vomiting during pregnancy  Lightheadedness    24 yo F G4P3, [redacted] wks pregnant who is lightheaded upon standing. Patient has had an issue with hyperemesis gravidarum this pregnancy. Has been taking Phenergan with transient relief. Has had vomiting over the past 3 or 4 days and difficulty keeping things down. Was at school today and felt exceptionally lightheaded. Side come here for evaluation.  Patient is well-appearing and nontoxic. EKG unremarkable. We'll give the patient liter bolus with Reglan oral trial.   Awaiting ua, patient care turned over to Dr. Clarene Duke.   I personally performed the services described in this documentation, which was  scribed in my presence. The recorded information has been reviewed and is accurate.     Melene Plan, DO 07/25/15 1234

## 2015-07-24 NOTE — Progress Notes (Signed)
Called from [facility] about patients arrival to hospital c/o dizziness and lightheadedness while at a class today. Pt is a G4P3 that is 27 weeks; EFM applied and reactive strip noted; patient denies pain at this time per ED RN; IV fluids given and UA performed; 3 contractions noted with uterine irritability; Dr Debroah LoopArnold called and notified of patient's complains and EFM and patient is being cleared by OB at this time.

## 2015-07-24 NOTE — ED Notes (Signed)
Patient states she is driving herself home and does not want the benadryl medication. States she is unable to get in touch with anyone to pick her up. States she will take the benadryl if she needs it at a later time.

## 2015-07-26 LAB — URINE CULTURE

## 2015-10-24 ENCOUNTER — Emergency Department (INDEPENDENT_AMBULATORY_CARE_PROVIDER_SITE_OTHER): Payer: Medicaid Other

## 2015-10-24 ENCOUNTER — Encounter (HOSPITAL_COMMUNITY): Payer: Self-pay | Admitting: Emergency Medicine

## 2015-10-24 ENCOUNTER — Emergency Department (INDEPENDENT_AMBULATORY_CARE_PROVIDER_SITE_OTHER)
Admission: EM | Admit: 2015-10-24 | Discharge: 2015-10-24 | Disposition: A | Discharge status: Home / Self Care | Payer: Medicaid Other | Source: Home / Self Care | Attending: Family Medicine | Admitting: Family Medicine

## 2015-10-24 DIAGNOSIS — M79644 Pain in right finger(s): Secondary | ICD-10-CM

## 2015-10-24 MED ORDER — SULFAMETHOXAZOLE-TRIMETHOPRIM 800-160 MG PO TABS: 1.0000 | ORAL_TABLET | Freq: Two times a day (BID) | ORAL | Status: AC

## 2015-10-24 MED ORDER — SULFAMETHOXAZOLE-TRIMETHOPRIM 800-160 MG PO TABS: 1.0000 | ORAL_TABLET | Freq: Two times a day (BID) | ORAL | Status: DC

## 2015-10-24 MED ORDER — HYDROCODONE-ACETAMINOPHEN 5-325 MG PO TABS: 1.0000 | ORAL_TABLET | ORAL | Status: DC | PRN

## 2015-10-24 NOTE — ED Provider Notes (Signed)
CSN: [id number]     Arrival date & time 10/24/15  1704 History   First MD Initiated Contact with Patient 10/24/15 1735     Chief Complaint  Patient presents with  . Hand Pain   (Consider location/radiation/quality/duration/timing/severity/associated sxs/prior Treatment) HPI Comments: 25 year old female complaining of pain to the end of the right thumb. Started last night rather insidiously. The pain has gradually increased in severity. She is complaining of moderate to severe pain and tenderness. There is minor erythema to the pulp of the thumb as well as to the base of the nail of the extensor surface. This involves the distal phalanx only does not involve the DIP joint. Denies any known injury. She does state that she uses her right thumb to text frequently. Denies systemic symptoms.   Past Medical History  Diagnosis Date  . Cerebral palsy (HCC)     mild, speech impediment  . Anxiety   . Gestational diabetes   . Depression    Past Surgical History  Procedure Laterality Date  . Cholecystectomy  2014  . Hernia repair     No family history on file. Social History  Substance Use Topics  . Smoking status: Former Smoker    Quit date: 10/31/2011  . Smokeless tobacco: Never Used  . Alcohol Use: Yes     Comment: ocasionally, not with the pregnancy   OB History    Gravida Para Term Preterm AB TAB SAB Ectopic Multiple Living   0 0 0 0 0 3     Review of Systems  Constitutional: Negative.   Musculoskeletal:       As per history of present illness. Patient is able to flex and extend his DIP joint. There is no pain directly in the joint but flexion and extension causes pain distally.  All other systems reviewed and are negative.   Allergies  Review of patient's allergies indicates no known allergies.  Home Medications   Prior to Admission medications   Medication Sig Start Date End Date Taking? Authorizing Provider  CITALOPRAM HYDROBROMIDE PO Take 1 tablet by mouth  daily.    Historical Provider, MD  HYDROcodone-acetaminophen (NORCO/VICODIN) 5-325 MG tablet Take 1 tablet by mouth every 4 (four) hours as needed. 10/24/15   Hayden Rasmussen, NP  metoCLOPramide (REGLAN) 5 MG tablet Take 1 tablet (5 mg total) by mouth every 6 (six) hours as needed for nausea or vomiting. 07/24/15   Samuel Jester, DO  sulfamethoxazole-trimethoprim (BACTRIM DS,SEPTRA DS) 800-160 MG tablet Take 1 tablet by mouth 2 (two) times daily. 10/24/15 10/31/15  Hayden Rasmussen, NP     Medications - No data to display  BP 104/67 mmHg  Pulse 68  Temp(Src) 97.9 F (36.6 C) (Oral)  Resp 16  SpO2 97%  Breastfeeding? Unknown No data found.   Physical Exam  Constitutional: She is oriented to person, place, and time. She appears well-developed and well-nourished. No distress.  Pulmonary/Chest: Effort normal. No respiratory distress.  Musculoskeletal:  Right thumb with minimal swelling to the pulp of the right thumb. There is no tension. It is relatively soft but exquisitely tender. No tenderness over the nail. Minor tenderness to the base of the nail extensor surface. No paronychia or swelling to the extensor surface. No joint tenderness. No open area to the skin is perceived. No puncture wound or other injury.  Neurological: She is alert and oriented to person, place, and time. No cranial nerve deficit. She exhibits normal muscle tone.  Skin: Skin is  warm and dry.  Psychiatric: She has a normal mood and affect.  Nursing note and vitals reviewed.   ED Course  Procedures (including critical care time)  Labs Review Labs Reviewed - No data to display  Imaging Review Dg Finger Thumb Right  10/24/2015  CLINICAL DATA:  Pain and swelling since last evening. No known injury. EXAM: RIGHT THUMB 2+V COMPARISON:  None. FINDINGS: The joint spaces are maintained. No acute bony findings. No significant degenerative changes. IMPRESSION: No acute bony findings. Electronically Signed   By: Rudie Meyer M.D.    On: 10/24/2015 18:37     Visual Acuity Review  Right Eye Distance:   Left Eye Distance:   Bilateral Distance:    Right Eye Near:   Left Eye Near:    Bilateral Near:         MDM   1. Thumb pain, right   Uncertain etiology. Consulted with Dr. Mina Marble.  Your thumb pain may represent an early infection. We will treat you with antibiotic's. Take as directed on the bottle. Warm compresses or warm water soaks. You are also receiving pain medicine that can make you drowsy. Return to the urgent care in 24 hours between 4:00 and 5:00 tomorrow to have the thumb rechecked. We have spoken to the hand specialist, described your condition to him and he requested the above treatment. The importance of treating a felon, at this develops into 1, was explained to the patient. If she does not receive treatment promptly for worsening thumb infection she would have a very poor outcome and potentially have severe damage to the thumb or lose it. Patient states she understands.     Hayden Rasmussen, NP 10/24/15 1918

## 2015-10-24 NOTE — ED Notes (Signed)
Right thumb pain that started around 7 pm last night.  No known injury right thumb swollen and very painful.

## 2015-10-24 NOTE — Discharge Instructions (Signed)
Your thumb pain may represent an early infection. We will treat you with antibiotic's. Take as directed on the bottle. Warm compresses or warm water soaks. You are also receiving pain medicine that can make you drowsy. Return to the urgent care in 24 hours between 4:00 and 5:00 tomorrow to have the thumb rechecked. We have spoken to the hand specialist, described your condition to him and he requested the above treatment.

## 2016-01-14 ENCOUNTER — Encounter (HOSPITAL_COMMUNITY): Payer: Self-pay | Admitting: Emergency Medicine

## 2016-01-14 ENCOUNTER — Emergency Department (HOSPITAL_COMMUNITY)
Admission: EM | Admit: 2016-01-14 | Discharge: 2016-01-15 | Disposition: A | Discharge status: Home / Self Care | Payer: Medicaid Other | Attending: Emergency Medicine | Admitting: Emergency Medicine

## 2016-01-14 DIAGNOSIS — F329 Major depressive disorder, single episode, unspecified: Secondary | ICD-10-CM | POA: Insufficient documentation

## 2016-01-14 DIAGNOSIS — N390 Urinary tract infection, site not specified: Secondary | ICD-10-CM | POA: Diagnosis not present

## 2016-01-14 DIAGNOSIS — R109 Unspecified abdominal pain: Secondary | ICD-10-CM | POA: Diagnosis not present

## 2016-01-14 DIAGNOSIS — R112 Nausea with vomiting, unspecified: Secondary | ICD-10-CM | POA: Diagnosis present

## 2016-01-14 DIAGNOSIS — Z87891 Personal history of nicotine dependence: Secondary | ICD-10-CM | POA: Insufficient documentation

## 2016-01-14 DIAGNOSIS — R319 Hematuria, unspecified: Secondary | ICD-10-CM | POA: Diagnosis not present

## 2016-01-14 LAB — CBC
HCT: 35.4 % — ABNORMAL LOW (ref 36.0–46.0)
HEMOGLOBIN: 11.8 g/dL — AB (ref 12.0–15.0)
MCH: 27.6 pg (ref 26.0–34.0)
MCHC: 33.3 g/dL (ref 30.0–36.0)
MCV: 82.9 fL (ref 78.0–100.0)
PLATELETS: 183 10*3/uL (ref 150–400)
RBC: 4.27 MIL/uL (ref 3.87–5.11)
RDW: 14.9 % (ref 11.5–15.5)
WBC: 3.9 10*3/uL — ABNORMAL LOW (ref 4.0–10.5)

## 2016-01-14 LAB — URINALYSIS, ROUTINE W REFLEX MICROSCOPIC
BILIRUBIN URINE: NEGATIVE
Glucose, UA: NEGATIVE mg/dL
KETONES UR: 15 mg/dL — AB
Leukocytes, UA: NEGATIVE
NITRITE: NEGATIVE
SPECIFIC GRAVITY, URINE: 1.025 (ref 1.005–1.030)
pH: 6 (ref 5.0–8.0)

## 2016-01-14 LAB — COMPREHENSIVE METABOLIC PANEL
ALK PHOS: 95 U/L (ref 38–126)
ALT: 35 U/L (ref 14–54)
ANION GAP: 9 (ref 5–15)
AST: 23 U/L (ref 15–41)
Albumin: 4.5 g/dL (ref 3.5–5.0)
BILIRUBIN TOTAL: 0.5 mg/dL (ref 0.3–1.2)
BUN: 10 mg/dL (ref 6–20)
CALCIUM: 8.8 mg/dL — AB (ref 8.9–10.3)
CO2: 24 mmol/L (ref 22–32)
CREATININE: 0.69 mg/dL (ref 0.44–1.00)
Chloride: 108 mmol/L (ref 101–111)
GFR calc Af Amer: >60 mL/min (ref >60)
GLUCOSE: 102 mg/dL — AB (ref 65–99)
Potassium: 3.4 mmol/L — ABNORMAL LOW (ref 3.5–5.1)
Sodium: 141 mmol/L (ref 135–145)
Total Protein: 7.4 g/dL (ref 6.5–8.1)

## 2016-01-14 LAB — URINE MICROSCOPIC-ADD ON

## 2016-01-14 LAB — PREGNANCY, URINE: PREG TEST UR: NEGATIVE

## 2016-01-14 LAB — LIPASE, BLOOD: Lipase: 24 U/L (ref 11–51)

## 2016-01-14 NOTE — ED Notes (Signed)
Pt c/o body aches, fever and vomiting since earlier today.

## 2016-01-15 ENCOUNTER — Emergency Department (HOSPITAL_COMMUNITY): Payer: Medicaid Other

## 2016-01-15 LAB — URINALYSIS, ROUTINE W REFLEX MICROSCOPIC
Glucose, UA: NEGATIVE mg/dL
Ketones, ur: >80 mg/dL — AB
Nitrite: NEGATIVE
PROTEIN: 30 mg/dL — AB
pH: 6.5 (ref 5.0–8.0)

## 2016-01-15 LAB — URINE MICROSCOPIC-ADD ON

## 2016-01-15 MED ORDER — CEPHALEXIN 500 MG PO CAPS: 500.0000 mg | ORAL_CAPSULE | Freq: Four times a day (QID) | ORAL | Status: DC

## 2016-01-15 MED ORDER — DIPHENHYDRAMINE HCL 50 MG/ML IJ SOLN
25.0000 mg | Freq: Once | INTRAMUSCULAR | Status: AC
  Administered 2016-01-15: 25 mg via INTRAVENOUS
  Filled 2016-01-15: qty 1

## 2016-01-15 MED ORDER — METOCLOPRAMIDE HCL 5 MG/ML IJ SOLN
10.0000 mg | Freq: Once | INTRAMUSCULAR | Status: AC
Start: 2016-01-15 — End: 2016-01-15
  Administered 2016-01-15: 10 mg via INTRAVENOUS
  Filled 2016-01-15: qty 2

## 2016-01-15 MED ORDER — HYDROCODONE-ACETAMINOPHEN 5-325 MG PO TABS: 1.0000 | ORAL_TABLET | ORAL | Status: DC | PRN

## 2016-01-15 MED ORDER — MECLIZINE HCL 12.5 MG PO TABS
25.0000 mg | ORAL_TABLET | Freq: Once | ORAL | Status: DC
Start: 2016-01-15 — End: 2016-01-15

## 2016-01-15 MED ORDER — MORPHINE SULFATE (PF) 4 MG/ML IV SOLN
4.0000 mg | Freq: Once | INTRAVENOUS | Status: AC
  Administered 2016-01-15: 4 mg via INTRAVENOUS
  Filled 2016-01-15: qty 1

## 2016-01-15 MED ORDER — ONDANSETRON HCL 4 MG/2ML IJ SOLN
4.0000 mg | Freq: Once | INTRAMUSCULAR | Status: AC
  Administered 2016-01-15: 4 mg via INTRAVENOUS
  Filled 2016-01-15: qty 2

## 2016-01-15 MED ORDER — METOCLOPRAMIDE HCL 10 MG PO TABS: 10.0000 mg | ORAL_TABLET | Freq: Four times a day (QID) | ORAL | Status: DC | PRN

## 2016-01-15 MED ORDER — SODIUM CHLORIDE 0.9 % IV SOLN
1000.0000 mL | INTRAVENOUS | Status: DC
  Administered 2016-01-15: 1000 mL via INTRAVENOUS

## 2016-01-15 MED ORDER — KETOROLAC TROMETHAMINE 30 MG/ML IJ SOLN
30.0000 mg | Freq: Once | INTRAMUSCULAR | Status: AC
  Administered 2016-01-15: 30 mg via INTRAVENOUS
  Filled 2016-01-15: qty 1

## 2016-01-15 MED ORDER — DEXTROSE 5 % IV SOLN
1.0000 g | Freq: Once | INTRAVENOUS | Status: AC
  Administered 2016-01-15: 1 g via INTRAVENOUS
  Filled 2016-01-15: qty 10

## 2016-01-15 MED ORDER — SODIUM CHLORIDE 0.9 % IV SOLN
1000.0000 mL | Freq: Once | INTRAVENOUS | Status: AC
  Administered 2016-01-15: 1000 mL via INTRAVENOUS

## 2016-01-15 NOTE — Discharge Instructions (Signed)
Urinary Tract Infection Urinary tract infections (UTIs) can develop anywhere along your urinary tract. Your urinary tract is your body's drainage system for removing wastes and extra water. Your urinary tract includes two kidneys, two ureters, a bladder, and a urethra. Your kidneys are a pair of bean-shaped organs. Each kidney is about the size of your fist. They are located below your ribs, one on each side of your spine. CAUSES Infections are caused by microbes, which are microscopic organisms, including fungi, viruses, and bacteria. These organisms are so small that they can only be seen through a microscope. Bacteria are the microbes that most commonly cause UTIs. SYMPTOMS  Symptoms of UTIs may vary by age and gender of the patient and by the location of the infection. Symptoms in young women typically include a frequent and intense urge to urinate and a painful, burning feeling in the bladder or urethra during urination. Older women and men are more likely to be tired, shaky, and weak and have muscle aches and abdominal pain. A fever may mean the infection is in your kidneys. Other symptoms of a kidney infection include pain in your back or sides below the ribs, nausea, and vomiting. DIAGNOSIS To diagnose a UTI, your caregiver will ask you about your symptoms. Your caregiver will also ask you to provide a urine sample. The urine sample will be tested for bacteria and white blood cells. White blood cells are made by your body to help fight infection. TREATMENT  Typically, UTIs can be treated with medication. Because most UTIs are caused by a bacterial infection, they usually can be treated with the use of antibiotics. The choice of antibiotic and length of treatment depend on your symptoms and the type of bacteria causing your infection. HOME CARE INSTRUCTIONS  If you were prescribed antibiotics, take them exactly as your caregiver instructs you. Finish the medication even if you feel better after  you have only taken some of the medication.  Drink enough water and fluids to keep your urine clear or pale yellow.  Avoid caffeine, tea, and carbonated beverages. They tend to irritate your bladder.  Empty your bladder often. Avoid holding urine for long periods of time.  Empty your bladder before and after sexual intercourse.  After a bowel movement, women should cleanse from front to back. Use each tissue only once. SEEK MEDICAL CARE IF:   You have back pain.  You develop a fever.  Your symptoms do not begin to resolve within 3 days. SEEK IMMEDIATE MEDICAL CARE IF:   You have severe back pain or lower abdominal pain.  You develop chills.  You have nausea or vomiting.  You have continued burning or discomfort with urination. MAKE SURE YOU:   Understand these instructions.  Will watch your condition.  Will get help right away if you are not doing well or get worse.   This information is not intended to replace advice given to you by your health care provider. Make sure you discuss any questions you have with your health care provider.   Document Released: 06/30/2005 Document Revised: 06/11/2015 Document Reviewed: 10/29/2011 Elsevier Interactive Patient Education 2016 [employer].  Cephalexin tablets or capsules What is this medicine? CEPHALEXIN (sef a LEX in) is a cephalosporin antibiotic. It is used to treat certain kinds of bacterial infections It will not work for colds, flu, or other viral infections. This medicine may be used for other purposes; ask your health care provider or pharmacist if you have questions. What should I  tell my health care provider before I take this medicine? They need to know if you have any of these conditions: -kidney disease -stomach or intestine problems, especially colitis -an unusual or allergic reaction to cephalexin, other cephalosporins, penicillins, other antibiotics, medicines, foods, dyes or preservatives -pregnant or trying  to get pregnant -breast-feeding How should I use this medicine? Take this medicine by mouth with a full glass of water. Follow the directions on the prescription label. This medicine can be taken with or without food. Take your medicine at regular intervals. Do not take your medicine more often than directed. Take all of your medicine as directed even if you think you are better. Do not skip doses or stop your medicine early. Talk to your pediatrician regarding the use of this medicine in children. While this drug may be prescribed for selected conditions, precautions do apply. Overdosage: If you think you have taken too much of this medicine contact a poison control center or emergency room at once. NOTE: This medicine is only for you. Do not share this medicine with others. What if I miss a dose? If you miss a dose, take it as soon as you can. If it is almost time for your next dose, take only that dose. Do not take double or extra doses. There should be at least 4 to 6 hours between doses. What may interact with this medicine? -probenecid -some other antibiotics This list may not describe all possible interactions. Give your health care provider a list of all the medicines, herbs, non-prescription drugs, or dietary supplements you use. Also tell them if you smoke, drink alcohol, or use illegal drugs. Some items may interact with your medicine. What should I watch for while using this medicine? Tell your doctor or health care professional if your symptoms do not begin to improve in a few days. Do not treat diarrhea with over the counter products. Contact your doctor if you have diarrhea that lasts more than 2 days or if it is severe and watery. If you have diabetes, you may get a false-positive result for sugar in your urine. Check with your doctor or health care professional. What side effects may I notice from receiving this medicine? Side effects that you should report to your doctor or health  care professional as soon as possible: -allergic reactions like skin rash, itching or hives, swelling of the face, lips, or tongue -breathing problems -pain or trouble passing urine -redness, blistering, peeling or loosening of the skin, including inside the mouth -severe or watery diarrhea -unusually weak or tired -yellowing of the eyes, skin Side effects that usually do not require medical attention (report to your doctor or health care professional if they continue or are bothersome): -gas or heartburn -genital or anal irritation -headache -joint or muscle pain -nausea, vomiting This list may not describe all possible side effects. Call your doctor for medical advice about side effects. You may report side effects to FDA at 1-800-FDA-1088. Where should I keep my medicine? Keep out of the reach of children. Store at room temperature between 59 and 86 degrees F (15 and 30 degrees C). Throw away any unused medicine after the expiration date. NOTE: This sheet is a summary. It may not cover all possible information. If you have questions about this medicine, talk to your doctor, pharmacist, or health care provider.    2016, Elsevier/Gold Standard. (2007-12-25 17:09:13)  Metoclopramide tablets What is this medicine? METOCLOPRAMIDE (met oh kloe PRA mide) is used to treat  the symptoms of gastroesophageal reflux disease (GERD) like heartburn. It is also used to treat people with slow emptying of the stomach and intestinal tract. This medicine may be used for other purposes; ask your health care provider or pharmacist if you have questions. What should I tell my health care provider before I take this medicine? They need to know if you have any of these conditions: -breast cancer -depression -diabetes -heart failure -high blood pressure -kidney disease -liver disease -Parkinson's disease or a movement disorder -pheochromocytoma -seizures -stomach obstruction, bleeding, or  perforation -an unusual or allergic reaction to metoclopramide, procainamide, sulfites, other medicines, foods, dyes, or preservatives -pregnant or trying to get pregnant -breast-feeding How should I use this medicine? Take this medicine by mouth with a glass of water. Follow the directions on the prescription label. Take this medicine on an empty stomach, about 30 minutes before eating. Take your doses at regular intervals. Do not take your medicine more often than directed. Do not stop taking except on the advice of your doctor or health care professional. A special MedGuide will be given to you by the pharmacist with each prescription and refill. Be sure to read this information carefully each time. Talk to your pediatrician regarding the use of this medicine in children. Special care may be needed. Overdosage: If you think you have taken too much of this medicine contact a poison control center or emergency room at once. NOTE: This medicine is only for you. Do not share this medicine with others. What if I miss a dose? If you miss a dose, take it as soon as you can. If it is almost time for your next dose, take only that dose. Do not take double or extra doses. What may interact with this medicine? -acetaminophen -cyclosporine -digoxin -medicines for blood pressure -medicines for diabetes, including insulin -medicines for hay fever and other allergies -medicines for depression, especially an Monoamine Oxidase Inhibitor (MAOI) -medicines for Parkinson's disease, like levodopa -medicines for sleep or for pain -tetracycline This list may not describe all possible interactions. Give your health care provider a list of all the medicines, herbs, non-prescription drugs, or dietary supplements you use. Also tell them if you smoke, drink alcohol, or use illegal drugs. Some items may interact with your medicine. What should I watch for while using this medicine? It may take a few weeks for your  stomach condition to start to get better. However, do not take this medicine for longer than 12 weeks. The longer you take this medicine, and the more you take it, the greater your chances are of developing serious side effects. If you are an elderly patient, a female patient, or you have diabetes, you may be at an increased risk for side effects from this medicine. Contact your doctor immediately if you start having movements you cannot control such as lip smacking, rapid movements of the tongue, involuntary or uncontrollable movements of the eyes, head, arms and legs, or muscle twitches and spasms. Patients and their families should watch out for worsening depression or thoughts of suicide. Also watch out for any sudden or severe changes in feelings such as feeling anxious, agitated, panicky, irritable, hostile, aggressive, impulsive, severely restless, overly excited and hyperactive, or not being able to sleep. If this happens, especially at the beginning of treatment or after a change in dose, call your doctor. Do not treat yourself for high fever. Ask your doctor or health care professional for advice. You may get drowsy or dizzy. Do  not drive, use machinery, or do anything that needs mental alertness until you know how this drug affects you. Do not stand or sit up quickly, especially if you are an older patient. This reduces the risk of dizzy or fainting spells. Alcohol can make you more drowsy and dizzy. Avoid alcoholic drinks. What side effects may I notice from receiving this medicine? Side effects that you should report to your doctor or health care professional as soon as possible: -allergic reactions like skin rash, itching or hives, swelling of the face, lips, or tongue -abnormal production of milk in females -breast enlargement in both males and females -change in the way you walk -difficulty moving, speaking or swallowing -drooling, lip smacking, or rapid movements of the tongue -excessive  sweating -fever -involuntary or uncontrollable movements of the eyes, head, arms and legs -irregular heartbeat or palpitations -muscle twitches and spasms -unusually weak or tired Side effects that usually do not require medical attention (report to your doctor or health care professional if they continue or are bothersome): -change in sex drive or performance -depressed mood -diarrhea -difficulty sleeping -headache -menstrual changes -restless or nervous This list may not describe all possible side effects. Call your doctor for medical advice about side effects. You may report side effects to FDA at 1-800-FDA-1088. Where should I keep my medicine? Keep out of the reach of children. Store at room temperature between 20 and 25 degrees C (68 and 77 degrees F). Protect from light. Keep container tightly closed. Throw away any unused medicine after the expiration date. NOTE: This sheet is a summary. It may not cover all possible information. If you have questions about this medicine, talk to your doctor, pharmacist, or health care provider.    2016, Elsevier/Gold Standard. (2012-01-18 13:04:38)  Acetaminophen; Hydrocodone tablets or capsules What is this medicine? ACETAMINOPHEN; HYDROCODONE (a set a MEE noe fen; hye droe KOE done) is a pain reliever. It is used to treat moderate to severe pain. This medicine may be used for other purposes; ask your health care provider or pharmacist if you have questions. What should I tell my health care provider before I take this medicine? They need to know if you have any of these conditions: -brain tumor -Crohn's disease, inflammatory bowel disease, or ulcerative colitis -drug abuse or addiction -head injury -heart or circulation problems -if you often drink alcohol -kidney disease or problems going to the bathroom -liver disease -lung disease, asthma, or breathing problems -an unusual or allergic reaction to acetaminophen, hydrocodone, other  opioid analgesics, other medicines, foods, dyes, or preservatives -pregnant or trying to get pregnant -breast-feeding How should I use this medicine? Take this medicine by mouth. Swallow it with a full glass of water. Follow the directions on the prescription label. If the medicine upsets your stomach, take the medicine with food or milk. Do not take more than you are told to take. Talk to your pediatrician regarding the use of this medicine in children. This medicine is not approved for use in children. Patients over 65 years may have a stronger reaction and need a smaller dose. Overdosage: If you think you have taken too much of this medicine contact a poison control center or emergency room at once. NOTE: This medicine is only for you. Do not share this medicine with others. What if I miss a dose? If you miss a dose, take it as soon as you can. If it is almost time for your next dose, take only that dose. Do not  take double or extra doses. What may interact with this medicine? -alcohol -antihistamines -isoniazid -medicines for depression, anxiety, or psychotic disturbances -medicines for sleep -muscle relaxants -naltrexone -narcotic medicines (opiates) for pain -phenobarbital -ritonavir -tramadol This list may not describe all possible interactions. Give your health care provider a list of all the medicines, herbs, non-prescription drugs, or dietary supplements you use. Also tell them if you smoke, drink alcohol, or use illegal drugs. Some items may interact with your medicine. What should I watch for while using this medicine? Tell your doctor or health care professional if your pain does not go away, if it gets worse, or if you have new or a different type of pain. You may develop tolerance to the medicine. Tolerance means that you will need a higher dose of the medicine for pain relief. Tolerance is normal and is expected if you take the medicine for a long time. Do not suddenly stop  taking your medicine because you may develop a severe reaction. Your body becomes used to the medicine. This does NOT mean you are addicted. Addiction is a behavior related to getting and using a drug for a non-medical reason. If you have pain, you have a medical reason to take pain medicine. Your doctor will tell you how much medicine to take. If your doctor wants you to stop the medicine, the dose will be slowly lowered over time to avoid any side effects. You may get drowsy or dizzy when you first start taking the medicine or change doses. Do not drive, use machinery, or do anything that may be dangerous until you know how the medicine affects you. Stand or sit up slowly. There are different types of narcotic medicines (opiates) for pain. If you take more than one type at the same time, you may have more side effects. Give your health care provider a list of all medicines you use. Your doctor will tell you how much medicine to take. Do not take more medicine than directed. Call emergency for help if you have problems breathing. The medicine will cause constipation. Try to have a bowel movement at least every 2 to 3 days. If you do not have a bowel movement for 3 days, call your doctor or health care professional. Too much acetaminophen can be very dangerous. Do not take Tylenol (acetaminophen) or medicines that contain acetaminophen with this medicine. Many non-prescription medicines contain acetaminophen. Always read the labels carefully. What side effects may I notice from receiving this medicine? Side effects that you should report to your doctor or health care professional as soon as possible: -allergic reactions like skin rash, itching or hives, swelling of the face, lips, or tongue -breathing problems -confusion -feeling faint or lightheaded, falls -stomach pain -yellowing of the eyes or skin Side effects that usually do not require medical attention (report to your doctor or health care  professional if they continue or are bothersome): -nausea, vomiting -stomach upset This list may not describe all possible side effects. Call your doctor for medical advice about side effects. You may report side effects to FDA at 1-800-FDA-1088. Where should I keep my medicine? Keep out of the reach of children. This medicine can be abused. Keep your medicine in a safe place to protect it from theft. Do not share this medicine with anyone. Selling or giving away this medicine is dangerous and against the law. This medicine may cause accidental overdose and death if it taken by other adults, children, or pets. Mix any unused medicine  with a substance like cat litter or coffee grounds. Then throw the medicine away in a sealed container like a sealed bag or a coffee can with a lid. Do not use the medicine after the expiration date. Store at room temperature between 15 and 30 degrees C (59 and 86 degrees F). NOTE: This sheet is a summary. It may not cover all possible information. If you have questions about this medicine, talk to your doctor, pharmacist, or health care provider.    2016, Elsevier/Gold Standard. (2014-08-21 15:29:20)

## 2016-01-15 NOTE — ED Provider Notes (Signed)
CSN: [id number]     Arrival date & time 01/14/16  2047 History  By signing my name below, I, Linus Galas, attest that this documentation has been prepared under the direction and in the presence of Dione Booze, MD. Electronically Signed: Linus Galas, ED Scribe. 01/15/2016. 12:23 AM.   Chief Complaint  Patient presents with  . Emesis   The history is provided by the patient. No language interpreter was used.   HPI Comments: Caitlin Bell is a 25 y.o. female who presents to the Emergency Department complaining of sudden lower back pain that began at 12 PM, 12 hours ago. She rates her pain a 10/10 in severity. Pt also reports abdominal pain,  nausea, vomiting, dizziness, and dysuria. She took Tylenol and Ibuprofen with no relief. Pt denies any fevers, chills, diaphoresis, or any other symptoms at this time.   Pt is seen at [facility] in [place], [place]  Past Medical History  Diagnosis Date  . Cerebral palsy (HCC)     mild, speech impediment  . Anxiety   . Gestational diabetes   . Depression    Past Surgical History  Procedure Laterality Date  . Cholecystectomy  2014  . Hernia repair     History reviewed. No pertinent family history. Social History  Substance Use Topics  . Smoking status: Former Smoker    Quit date: 10/31/2011  . Smokeless tobacco: Never Used  . Alcohol Use: Yes     Comment: ocasionally, not with the pregnancy   OB History    Gravida Para Term Preterm AB TAB SAB Ectopic Multiple Living   0 0 0 0 0 3     Review of Systems  Constitutional: Negative for fever, chills and diaphoresis.  Gastrointestinal: Positive for nausea, vomiting and abdominal pain.  Genitourinary: Positive for dysuria.  Musculoskeletal: Positive for back pain.  Neurological: Positive for dizziness.  All other systems reviewed and are negative.   Allergies  Review of patient's allergies indicates no known allergies.  Home Medications   Prior to Admission  medications   Medication Sig Start Date End Date Taking? Authorizing Provider  CITALOPRAM HYDROBROMIDE PO Take 1 tablet by mouth daily.    Historical Provider, MD  HYDROcodone-acetaminophen (NORCO/VICODIN) 5-325 MG tablet Take 1 tablet by mouth every 4 (four) hours as needed. 10/24/15   Hayden Rasmussen, NP  metoCLOPramide (REGLAN) 5 MG tablet Take 1 tablet (5 mg total) by mouth every 6 (six) hours as needed for nausea or vomiting. 07/24/15   Samuel Jester, DO   BP 113/67 mmHg  Pulse 100  Temp(Src) 99.4 F (37.4 C) (Temporal)  Resp 18  Ht  (1.626 m)  Wt 120 lb (54.432 kg)  BMI 20.59 kg/m2  SpO2 100%  LMP 01/14/2016   Physical Exam  Constitutional: She is oriented to person, place, and time. She appears well-developed and well-nourished.  HENT:  Head: Normocephalic and atraumatic.  Eyes: EOM are normal. Pupils are equal, round, and reactive to light.  Neck: Normal range of motion. Neck supple. No JVD present.  Cardiovascular: Normal rate, regular rhythm and normal heart sounds.   No murmur heard. Pulmonary/Chest: Effort normal. She has no wheezes. She has no rales. She exhibits no tenderness.  Abdominal: Soft. She exhibits no mass. Bowel sounds are decreased. There is no rebound and no guarding.  Mild tenderness across lower abdomen  Musculoskeletal: Normal range of motion. She exhibits no edema.  Moderate bilateral CVA tenderness, right worse than  left.  Lymphadenopathy:    She has no cervical adenopathy.  Neurological: She is alert and oriented to person, place, and time. No cranial nerve deficit. She exhibits normal muscle tone. Coordination normal.  Skin: Skin is warm and dry. No rash noted.  Psychiatric: She has a normal mood and affect. Her behavior is normal. Judgment and thought content normal.  Nursing note and vitals reviewed.   ED Course  Procedures   DIAGNOSTIC STUDIES: Oxygen Saturation is 100% on room air, normal by my interpretation.    COORDINATION OF  CARE: 12:15 AM Will give fluids, pain medication, and antiemetic. Will order CT renal study, urinalysis, pregnancy screen, and blood work. Discussed treatment plan with pt at bedside and pt agreed to plan.  Labs Review Results for orders placed or performed during the hospital encounter of 01/14/16  Lipase, blood  Result Value Ref Range   Lipase 24 11 - 51 U/L  Comprehensive metabolic panel  Result Value Ref Range   Sodium 141 135 - 145 mmol/L   Potassium 3.4 (L) 3.5 - 5.1 mmol/L   Chloride 108 101 - 111 mmol/L   CO2 24 22 - 32 mmol/L   Glucose, Bld 102 (H) 65 - 99 mg/dL   BUN 10 6 - 20 mg/dL   Creatinine, Ser 8.650.69 0.44 - 1.00 mg/dL   Calcium 8.8 (L) 8.9 - 10.3 mg/dL   Total Protein 7.4 6.5 - 8.1 g/dL   Albumin 4.5 3.5 - 5.0 g/dL   AST 23 15 - 41 U/L   ALT 35 14 - 54 U/L   Alkaline Phosphatase 95 38 - 126 U/L   Total Bilirubin 0.5 0.3 - 1.2 mg/dL   GFR calc non Af Amer >60 >60 mL/min   GFR calc Af Amer >60 >60 mL/min   Anion gap 9 5 - 15  CBC  Result Value Ref Range   WBC 3.9 (L) 4.0 - 10.5 K/uL   RBC 4.27 3.87 - 5.11 MIL/uL   Hemoglobin 11.8 (L) 12.0 - 15.0 g/dL   HCT 78.435.4 (L) 69.636.0 - 29.546.0 %   MCV 82.9 78.0 - 100.0 fL   MCH 27.6 26.0 - 34.0 pg   MCHC 33.3 30.0 - 36.0 g/dL   RDW 28.414.9 13.211.5 - 44.015.5 %   Platelets 183 150 - 400 K/uL  Urinalysis, Routine w reflex microscopic (not [facility])  Result Value Ref Range   Color, Urine YELLOW YELLOW   APPearance CLEAR CLEAR   Specific Gravity, Urine 1.025 1.005 - 1.030   pH 6.0 5.0 - 8.0   Glucose, UA NEGATIVE NEGATIVE mg/dL   Hgb urine dipstick LARGE (A) NEGATIVE   Bilirubin Urine NEGATIVE NEGATIVE   Ketones, ur 15 (A) NEGATIVE mg/dL   Protein, ur TRACE (A) NEGATIVE mg/dL   Nitrite NEGATIVE NEGATIVE   Leukocytes, UA NEGATIVE NEGATIVE  Pregnancy, urine  Result Value Ref Range   Preg Test, Ur NEGATIVE NEGATIVE  Urine microscopic-add on  Result Value Ref Range   Squamous Epithelial / LPF TOO NUMEROUS TO COUNT (A) NONE SEEN    WBC, UA 0-5 0 - 5 WBC/hpf   RBC / HPF 6-30 0 - 5 RBC/hpf   Bacteria, UA MANY (A) NONE SEEN   Urine-Other MUCOUS PRESENT   Urinalysis, Routine w reflex microscopic  Result Value Ref Range   Color, Urine AMBER (A) YELLOW   APPearance HAZY (A) CLEAR   Specific Gravity, Urine >1.030 (H) 1.005 - 1.030   pH 6.5 5.0 - 8.0  Glucose, UA NEGATIVE NEGATIVE mg/dL   Hgb urine dipstick LARGE (A) NEGATIVE   Bilirubin Urine SMALL (A) NEGATIVE   Ketones, ur >80 (A) NEGATIVE mg/dL   Protein, ur 30 (A) NEGATIVE mg/dL   Nitrite NEGATIVE NEGATIVE   Leukocytes, UA SMALL (A) NEGATIVE  Urine microscopic-add on  Result Value Ref Range   Squamous Epithelial / LPF TOO NUMEROUS TO COUNT (A) NONE SEEN   WBC, UA 6-30 0 - 5 WBC/hpf   RBC / HPF TOO NUMEROUS TO COUNT 0 - 5 RBC/hpf   Bacteria, UA MANY (A) NONE SEEN   Imaging Review Ct Renal Stone Study  01/15/2016  CLINICAL DATA:  Sudden onset low back pain, 12 hours duration. EXAM: CT ABDOMEN AND PELVIS WITHOUT CONTRAST TECHNIQUE: Multidetector CT imaging of the abdomen and pelvis was performed following the standard protocol without IV contrast. COMPARISON:  None. FINDINGS: There are no urinary tract calculi. There is no hydronephrosis or ureteral dilatation. There are unremarkable unenhanced appearances of the liver, spleen, pancreas, adrenals and kidneys. There is cholecystectomy. Bile ducts are unremarkable. The abdominal aorta is normal in caliber. There is no atherosclerotic calcification. There is no adenopathy in the abdomen or pelvis. There are normal appearances of the stomach, small bowel and colon. There is no bowel obstruction. There is no extraluminal air. There is no acute inflammatory change in the abdomen or pelvis. There is no ascites. Uterus and adnexal structures are unremarkable. There is no significant abnormality in the lower chest. There is no significant musculoskeletal lesion. IMPRESSION: No significant abnormality is evident in the abdomen or  pelvis. Electronically Signed   By: Ellery Plunk M.D.   On: 01/15/2016 01:32   I have personally reviewed and evaluated these images and lab results as part of my medical decision-making.   MDM   Final diagnoses:  Bilateral flank pain  Urinary tract infection with hematuria, site unspecified    Lower abdominal pain and flank pain. Diagnostic possibility is include urinary tract infection, urolithiasis, diverticulitis, appendicitis. Urinalysis does show many bacteria, but appears to be a contaminated specimen. Repeat urinalysis is obtained via catheter and continues to show significant bacteriuria as well as some pyuria. She was sent for CT of abdomen and pelvis with renal stone protocol which was unremarkable. Urine is sent for culture and she is given a dose of ceftriaxone. She was given ketorolac for pain which did not give relief. She was given morphine which did give her pain relief but induced nausea. Ondansetron was not effective for nausea but metoclopramide was. She is discharged with prescriptions for cephalexin, metoclopramide, and hydrocodone-acetaminophen. Follow-up with PCP to repeat urinalysis when she has completed the course of antibiotics. Old records were reviewed, and she has no relevant past visits.  I personally performed the services described in this documentation, which was scribed in my presence. The recorded information has been reviewed and is accurate.      Dione Booze, MD 01/15/16 [phone]

## 2016-01-16 LAB — URINE CULTURE

## 2016-02-02 ENCOUNTER — Encounter (HOSPITAL_COMMUNITY): Payer: Self-pay | Admitting: *Deleted

## 2016-02-02 ENCOUNTER — Emergency Department (HOSPITAL_COMMUNITY)
Admission: EM | Admit: 2016-02-02 | Discharge: 2016-02-02 | Payer: Medicaid Other | Attending: Dermatology | Admitting: Dermatology

## 2016-02-02 ENCOUNTER — Encounter (HOSPITAL_COMMUNITY): Payer: Self-pay | Admitting: Emergency Medicine

## 2016-02-02 ENCOUNTER — Inpatient Hospital Stay (HOSPITAL_COMMUNITY)
Admission: AD | Admit: 2016-02-02 | Discharge: 2016-02-06 | DRG: 885 | Disposition: A | Discharge status: Home / Self Care | Payer: Medicaid Other | Attending: Psychiatry | Admitting: Psychiatry

## 2016-02-02 DIAGNOSIS — F411 Generalized anxiety disorder: Secondary | ICD-10-CM | POA: Diagnosis present

## 2016-02-02 DIAGNOSIS — Z9049 Acquired absence of other specified parts of digestive tract: Secondary | ICD-10-CM | POA: Diagnosis not present

## 2016-02-02 DIAGNOSIS — G4700 Insomnia, unspecified: Secondary | ICD-10-CM | POA: Diagnosis present

## 2016-02-02 DIAGNOSIS — Z87891 Personal history of nicotine dependence: Secondary | ICD-10-CM

## 2016-02-02 DIAGNOSIS — F329 Major depressive disorder, single episode, unspecified: Secondary | ICD-10-CM | POA: Insufficient documentation

## 2016-02-02 DIAGNOSIS — G809 Cerebral palsy, unspecified: Secondary | ICD-10-CM | POA: Diagnosis present

## 2016-02-02 DIAGNOSIS — F315 Bipolar disorder, current episode depressed, severe, with psychotic features: Principal | ICD-10-CM | POA: Diagnosis present

## 2016-02-02 DIAGNOSIS — R441 Visual hallucinations: Secondary | ICD-10-CM | POA: Insufficient documentation

## 2016-02-02 DIAGNOSIS — Z79891 Long term (current) use of opiate analgesic: Secondary | ICD-10-CM | POA: Insufficient documentation

## 2016-02-02 DIAGNOSIS — Z79899 Other long term (current) drug therapy: Secondary | ICD-10-CM | POA: Diagnosis not present

## 2016-02-02 DIAGNOSIS — R45851 Suicidal ideations: Secondary | ICD-10-CM | POA: Diagnosis present

## 2016-02-02 DIAGNOSIS — F410 Panic disorder [episodic paroxysmal anxiety] without agoraphobia: Secondary | ICD-10-CM | POA: Diagnosis present

## 2016-02-02 DIAGNOSIS — Z8659 Personal history of other mental and behavioral disorders: Secondary | ICD-10-CM

## 2016-02-02 LAB — CBC
HEMATOCRIT: 37.1 % (ref 36.0–46.0)
Hemoglobin: 12.4 g/dL (ref 12.0–15.0)
MCH: 27.6 pg (ref 26.0–34.0)
MCHC: 33.4 g/dL (ref 30.0–36.0)
MCV: 82.4 fL (ref 78.0–100.0)
Platelets: 290 10*3/uL (ref 150–400)
RBC: 4.5 MIL/uL (ref 3.87–5.11)
RDW: 14.6 % (ref 11.5–15.5)
WBC: 5.7 10*3/uL (ref 4.0–10.5)

## 2016-02-02 LAB — COMPREHENSIVE METABOLIC PANEL
ALBUMIN: 4.5 g/dL (ref 3.5–5.0)
ALK PHOS: 97 U/L (ref 38–126)
ALT: 35 U/L (ref 14–54)
AST: 26 U/L (ref 15–41)
Anion gap: 10 (ref 5–15)
BILIRUBIN TOTAL: 0.6 mg/dL (ref 0.3–1.2)
BUN: 11 mg/dL (ref 6–20)
CALCIUM: 9.2 mg/dL (ref 8.9–10.3)
CO2: 25 mmol/L (ref 22–32)
CREATININE: 0.72 mg/dL (ref 0.44–1.00)
Chloride: 103 mmol/L (ref 101–111)
GFR calc Af Amer: >60 mL/min (ref >60)
GFR calc non Af Amer: >60 mL/min (ref >60)
GLUCOSE: 94 mg/dL (ref 65–99)
Potassium: 4 mmol/L (ref 3.5–5.1)
Sodium: 138 mmol/L (ref 135–145)
TOTAL PROTEIN: 7.3 g/dL (ref 6.5–8.1)

## 2016-02-02 LAB — RAPID URINE DRUG SCREEN, HOSP PERFORMED
Amphetamines: NOT DETECTED
BARBITURATES: NOT DETECTED
Benzodiazepines: NOT DETECTED
Cocaine: NOT DETECTED
Opiates: NOT DETECTED
Tetrahydrocannabinol: NOT DETECTED

## 2016-02-02 LAB — I-STAT BETA HCG BLOOD, ED (MC, WL, AP ONLY): I-stat hCG, quantitative: <5 m[IU]/mL (ref <5)

## 2016-02-02 LAB — SALICYLATE LEVEL: Salicylate Lvl: <4 mg/dL (ref 2.8–30.0)

## 2016-02-02 LAB — ETHANOL: Alcohol, Ethyl (B): <5 mg/dL (ref <5)

## 2016-02-02 LAB — ACETAMINOPHEN LEVEL: Acetaminophen (Tylenol), Serum: <10 ug/mL — ABNORMAL LOW (ref 10–30)

## 2016-02-02 MED ORDER — ALUM & MAG HYDROXIDE-SIMETH 200-200-20 MG/5ML PO SUSP: 30.0000 mL | ORAL | Status: DC | PRN

## 2016-02-02 MED ORDER — ZOLPIDEM TARTRATE 5 MG PO TABS: 5.0000 mg | ORAL_TABLET | Freq: Every evening | ORAL | Status: DC | PRN

## 2016-02-02 MED ORDER — ACETAMINOPHEN 325 MG PO TABS
650.0000 mg | ORAL_TABLET | Freq: Four times a day (QID) | ORAL | Status: DC | PRN
  Administered 2016-02-04 – 2016-02-05 (×3): 650 mg via ORAL
  Filled 2016-02-02 (×3): qty 2

## 2016-02-02 MED ORDER — LORAZEPAM 1 MG PO TABS
1.0000 mg | ORAL_TABLET | Freq: Three times a day (TID) | ORAL | Status: DC | PRN
  Administered 2016-02-02: 1 mg via ORAL
  Filled 2016-02-02: qty 1

## 2016-02-02 MED ORDER — ARIPIPRAZOLE 2 MG PO TABS
2.0000 mg | ORAL_TABLET | Freq: Every day | ORAL | Status: DC
  Administered 2016-02-02: 2 mg via ORAL
  Filled 2016-02-02 (×4): qty 1

## 2016-02-02 MED ORDER — NICOTINE 21 MG/24HR TD PT24
21.0000 mg | MEDICATED_PATCH | Freq: Every day | TRANSDERMAL | Status: DC
  Filled 2016-02-02 (×5): qty 1

## 2016-02-02 MED ORDER — IBUPROFEN 200 MG PO TABS: 600.0000 mg | ORAL_TABLET | Freq: Three times a day (TID) | ORAL | Status: DC | PRN

## 2016-02-02 MED ORDER — ACETAMINOPHEN 325 MG PO TABS: 650.0000 mg | ORAL_TABLET | ORAL | Status: DC | PRN

## 2016-02-02 MED ORDER — LORAZEPAM 1 MG PO TABS
1.0000 mg | ORAL_TABLET | Freq: Three times a day (TID) | ORAL | Status: DC | PRN
  Administered 2016-02-02 – 2016-02-04 (×5): 1 mg via ORAL
  Filled 2016-02-02 (×6): qty 1

## 2016-02-02 MED ORDER — NICOTINE 21 MG/24HR TD PT24: 21.0000 mg | MEDICATED_PATCH | Freq: Every day | TRANSDERMAL | Status: DC

## 2016-02-02 MED ORDER — IBUPROFEN 600 MG PO TABS: 600.0000 mg | ORAL_TABLET | Freq: Three times a day (TID) | ORAL | Status: DC | PRN

## 2016-02-02 MED ORDER — MAGNESIUM HYDROXIDE 400 MG/5ML PO SUSP: 30.0000 mL | Freq: Every day | ORAL | Status: DC | PRN

## 2016-02-02 MED ORDER — ARIPIPRAZOLE 2 MG PO TABS
2.0000 mg | ORAL_TABLET | Freq: Every day | ORAL | Status: DC
  Filled 2016-02-02: qty 1

## 2016-02-02 NOTE — ED Notes (Signed)
Pt has been seen by TTS.  No bed currently available at [facility].  

## 2016-02-02 NOTE — BH Assessment (Signed)
Tele Assessment Note   Caitlin Bell is an 25 y.o. female. Pt presents voluntarily to [facility] for an evaluation. PT reports she had CPS worker drive her to [facility]. Pt says their is a CPS report open b/c she was  Recently homeless with her 3 kids (2 yr, 51yr, 5 mo). Pt says her mom had been keeping her kids but pt recently got her kids back one month ago. Pt reports hx of bipolar d/o. She says, "I am having hallucinations, and i don't feel safe." She says she has been seeing "a person standing over my bed." Pt reports insomnia and says she is only sleeping one hr a day. She reports daily panic attacks. Pt says she tries to eat but then vomits after her panic attacks. She reports she was inpatient at [facility] in 2010 for bipolar d/o. She reports she drinks approx one small bottle of vodka daily. She denies substance abuse. She reports impairment in recent memory and decreased concentration. She endorses loss of interest in usual pleasures, fatigue, guilt, worthlessness, isolating bx, irritability and tearfulness. Pt says she is trying to apply for disability. Pt again says, "I just don't feel safe." Pt reports her anxiety became worse approx one month ago when her kids began living with her again. Pt endorses SI. She says "it just would be better if I wasn't here." She reports her anxiety is affecting her ability to perform her daily functions including caring for her kids. Pt reports she goes to [facility] for med management and therapy. She says she doesn't think her psych med is working. Pt says she is compliant with her med - 2 mg abilify.   Diagnosis: Bipolar Disorder, Current Episode Depressed Unspecified Anxiety Disorder  Past Medical History:  Past Medical History  Diagnosis Date  . Cerebral palsy (HCC)     mild, speech impediment  . Anxiety   . Gestational diabetes   . Depression     Past Surgical History  Procedure Laterality Date  . Cholecystectomy  2014  . Hernia repair      Family  History: No family history on file.  Social History:  reports that she quit smoking about 4 years ago. She has never used smokeless tobacco. She reports that she drinks alcohol. She reports that she does not use illicit drugs.  Additional Social History:     CIWA: CIWA-Ar BP: 112/77 mmHg Pulse Rate: 80 COWS:    PATIENT STRENGTHS: (choose at least two) Ability for insight Average or above average intelligence Capable of independent living Communication skills  Allergies: No Known Allergies  Home Medications:  (Not in a hospital admission)  OB/GYN Status:  Patient's last menstrual period was 01/14/2016.  General Assessment Data Location of Assessment: [facility] Assessment Services TTS Assessment: In system Is this a Tele or Face-to-Face Assessment?: Face-to-Face Is this an Initial Assessment or a Re-assessment for this encounter?: Initial Assessment Marital status: Single Is patient pregnant?: Unknown Pregnancy Status: Unknown Living Arrangements: Children (3 kids - 2 yrs, 1 yr, 5 mo) Can pt return to current living arrangement?: Yes Admission Status: Voluntary Is patient capable of signing voluntary admission?: Yes Referral Source: Self/Family/Friend Insurance type: self pay     Crisis Care Plan Living Arrangements: Children (3 kids - 2 yrs, 1 yr, 5 mo) Name of Psychiatrist: at [facility] Name of Therapist: Granville Lewis  Education Status Is patient currently in school?: No Highest grade of school patient has completed: 12  Risk to self with the past  6 months Suicidal Ideation: Yes-Currently Present Has patient been a risk to self within the past 6 months prior to admission? : Yes Suicidal Intent: No Has patient had any suicidal intent within the past 6 months prior to admission? : No Is patient at risk for suicide?: Yes Suicidal Plan?: No Has patient had any suicidal plan within the past 6 months prior to admission? : No Access to Means:  (n/a) What  has been your use of drugs/alcohol within the last 12 months?: she daily drinks small bottle vodka Previous Attempts/Gestures: No How many times?: 0 Other Self Harm Risks: none Triggers for Past Attempts:  (n/a) Intentional Self Injurious Behavior: None Family Suicide History: No (maternal side hx bipolar) Recent stressful life event(s):  (depression and anxiety, got kids back 1 month ago) Persecutory voices/beliefs?: No Depression: Yes Depression Symptoms: Insomnia, Isolating, Fatigue, Guilt, Loss of interest in usual pleasures, Feeling worthless/self pity, Feeling angry/irritable Substance abuse history and/or treatment for substance abuse?: No Suicide prevention information given to non-admitted patients: Not applicable  Risk to Others within the past 6 months Homicidal Ideation: No Does patient have any lifetime risk of violence toward others beyond the six months prior to admission? : No Thoughts of Harm to Others: No Current Homicidal Intent: No Current Homicidal Plan: No Access to Homicidal Means: No Identified Victim: none History of harm to others?: No Assessment of Violence: None Noted Violent Behavior Description: pt denies hx violence Does patient have access to weapons?: No Criminal Charges Pending?: No Does patient have a court date: No Is patient on probation?: No  Psychosis Hallucinations: Visual (seeing people standing over her bed) Delusions: None noted  Mental Status Report Appearance/Hygiene: Unremarkable Eye Contact: Good Motor Activity: Freedom of movement, Unremarkable (pt's arms crossed b/c she is cold) Speech: Logical/coherent Level of Consciousness: Alert Mood: Depressed, Anxious, Anhedonia, Sad, Irritable, Guilty Affect: Appropriate to circumstance, Blunted Anxiety Level: Panic Attacks Panic attack frequency: daily Most recent panic attack: today Thought Processes: Relevant, Coherent Judgement: Unimpaired Orientation: Person, Place, Time,  Situation Obsessive Compulsive Thoughts/Behaviors: None  Cognitive Functioning Concentration: Decreased Memory: Recent Impaired, Remote Intact IQ: Average Insight: Fair Impulse Control: Fair Appetite: Poor Sleep: Decreased Total Hours of Sleep: 1 Vegetative Symptoms: None  ADLScreening [facility] Assessment Services) Patient's cognitive ability adequate to safely complete daily activities?: Yes Patient able to express need for assistance with ADLs?: Yes Independently performs ADLs?: Yes (appropriate for developmental age)  Prior Inpatient Therapy Prior Inpatient Therapy: Yes Prior Therapy Dates: 2010 [facility] Reason for Treatment: bipolar d/o  Prior Outpatient Therapy Prior Outpatient Therapy: Yes Prior Therapy Dates: currently Prior Therapy Facilty/Provider(s): [facility] Reason for Treatment: therapy, med management Does patient have an ACCT team?: No Does patient have Intensive In-House Services?  : No Does patient have [facility] services? : No Does patient have P4CC services?: Unknown  ADL Screening (condition at time of admission) Patient's cognitive ability adequate to safely complete daily activities?: Yes Patient able to express need for assistance with ADLs?: Yes Independently performs ADLs?: Yes (appropriate for developmental age)             Merchant navy officerAdvance Directives (For Healthcare) Does patient have an advance directive?: No    Additional Information 1:1 In Past 12 Months?: No CIRT Risk: No Elopement Risk: No Does patient have medical clearance?: No     Disposition:  Disposition Initial Assessment Completed for this Encounter: Yes Disposition of Patient: Inpatient treatment program Type of inpatient treatment program: Adult (laura davis np recommends  inpatient)  [facility], Cortlynn Hollinsworth P 02/02/2016 12:59 PM

## 2016-02-02 NOTE — Progress Notes (Signed)
Adult Psychoeducational Group Note  Date:  02/02/2016 Time:  9:46 PM  Group Topic/Focus:  Wrap-Up Group:   The focus of this group is to help patients review their daily goal of treatment and discuss progress on daily workbooks.  Participation Level:  Active  Participation Quality:  Appropriate  Affect:  Appropriate  Cognitive:  Alert  Insight: Appropriate  Engagement in Group:  Engaged  Modes of Intervention:  Discussion  Additional Comments:  Patient states, "my day was so-so". Patient goal for today was to get plenty of rest.  Anjel Perfetti L Estel Tonelli 02/02/2016, 9:46 PM

## 2016-02-02 NOTE — ED Provider Notes (Signed)
CSN: [id number]     Arrival date & time 02/02/16  1138 History   First MD Initiated Contact with Patient 02/02/16 1146     Chief Complaint  Patient presents with  . Hallucinations  . Depression     (Consider location/radiation/quality/duration/timing/severity/associated sxs/prior Treatment) Patient is a 25 y.o. female presenting with depression. The history is provided by the patient and medical records.  Depression   25 year old female with history of cerebral palsy with residual speech impediment, anxiety, depression, presenting to the ED from behavioral health for medical clearance and inpatient treatment.  Patient states for the past few days she has been experiencing visual hallucinations. She states she mostly sees these people at night when she is trying to sleep.  She states she feels that they are in her home to kill her which makes her very anxious.  She states these are figures but she does not necessarily recognize them as people she knows.  States they do not talk to her and she denies hearing any other voices.  She states she does drink alcohol occasionally, last drink was last night where she drank 1 smirnoff bottled drink. Denies any illicit drug use. States she has had some suicidal thoughts, however has not acted on this. She denies any homicidal ideation. Patient has required hospitalization in the past at [facility] in 2015. She is currently on Abilify, does not think that is helping her symptoms. She was evaluated by TTS counselor at behavioral health early this morning and sent here for medical clearance and holding for inpatient bed.  Past Medical History  Diagnosis Date  . Cerebral palsy (HCC)     mild, speech impediment  . Anxiety   . Gestational diabetes   . Depression    Past Surgical History  Procedure Laterality Date  . Cholecystectomy  2014  . Hernia repair     No family history on file. Social History  Substance Use Topics  . Smoking status: Former Smoker     Quit date: 10/31/2011  . Smokeless tobacco: Never Used  . Alcohol Use: Yes     Comment: ocasionally, not with the pregnancy   OB History    Gravida Para Term Preterm AB TAB SAB Ectopic Multiple Living   0 0 0 0 0 3     Review of Systems  Psychiatric/Behavioral: Positive for depression and hallucinations. The patient is nervous/anxious.   All other systems reviewed and are negative.     Allergies  Review of patient's allergies indicates no known allergies.  Home Medications   Prior to Admission medications   Medication Sig Start Date End Date Taking? Authorizing Provider  cephALEXin (KEFLEX) 500 MG capsule Take 1 capsule (500 mg total) by mouth 4 (four) times daily. 01/15/16   Dione Booze, MD  CITALOPRAM HYDROBROMIDE PO Take 1 tablet by mouth daily.    Historical Provider, MD  HYDROcodone-acetaminophen (NORCO/VICODIN) 5-325 MG tablet Take 1 tablet by mouth every 4 (four) hours as needed. 01/15/16   Dione Booze, MD  metoCLOPramide (REGLAN) 10 MG tablet Take 1 tablet (10 mg total) by mouth every 6 (six) hours as needed for nausea. 01/15/16   Dione Booze, MD   BP 112/77 mmHg  Pulse 80  Temp(Src) 98.7 F (37.1 C) (Oral)  Resp 16  SpO2 100%  LMP 01/14/2016  Breastfeeding? No   Physical Exam  Constitutional: She is oriented to person, place, and time. She appears well-developed and well-nourished. No distress.  HENT:  Head:  Normocephalic and atraumatic.  Mouth/Throat: Oropharynx is clear and moist.  Eyes: Conjunctivae and EOM are normal. Pupils are equal, round, and reactive to light.  Neck: Normal range of motion. Neck supple.  Cardiovascular: Normal rate, regular rhythm and normal heart sounds.   Pulmonary/Chest: Effort normal and breath sounds normal. No respiratory distress. She has no wheezes.  Abdominal: Soft. Bowel sounds are normal. There is no tenderness. There is no guarding.  Musculoskeletal: Normal range of motion. She exhibits no edema.  Neurological:  She is alert and oriented to person, place, and time.  Skin: Skin is warm and dry. She is not diaphoretic.  Psychiatric: Her mood appears anxious. She is actively hallucinating. She expresses suicidal ideation. She expresses no homicidal ideation. She expresses no suicidal plans and no homicidal plans.  Visual hallucinations of people Some SI noted without plan Denies HI  Nursing note and vitals reviewed.   ED Course  Procedures (including critical care time) Labs Review Labs Reviewed  ACETAMINOPHEN LEVEL - Abnormal; Notable for the following:    Acetaminophen (Tylenol), Serum <10 (*)    All other components within normal limits  COMPREHENSIVE METABOLIC PANEL  ETHANOL  SALICYLATE LEVEL  CBC  URINE RAPID DRUG SCREEN, HOSP PERFORMED  I-STAT BETA HCG BLOOD, ED ([facility], [facility], [facility] ONLY)    Imaging Review No results found. I have personally reviewed and evaluated these images and lab results as part of my medical decision-making.   EKG Interpretation None      MDM   Final diagnoses:  Visual hallucinations   25 year old female here with new visual hallucinations. Endorses some suicidal thoughts, but no active plan. Denies homicidal ideation. Admits to occasional alcohol use, denies illicit drug use. Patient without any physical complaints currently. [facility] and sent here for medical clearance and placement as they have no beds available.  She does appear somewhat anxious on exam. She does not have any tremors or seizure activity.  Labwork was obtained and is reassuring. Patient medically cleared. She is awaiting inpatient placement. Holding orders in home meds ordered.  Garlon HatchetLisa M Tatiyana Foucher, PA-C 02/02/16 1453  Cathren LaineKevin Steinl, MD 02/03/16 [phone]

## 2016-02-02 NOTE — ED Notes (Signed)
Patient here with complaints of auditory/visual hallucinations. Denies SI/HI. [facility] Takes Abilify 2mg , thinks that it is not helping.

## 2016-02-02 NOTE — ED Notes (Signed)
Pt transported to [facility] by [facility]. 

## 2016-02-02 NOTE — Plan of Care (Signed)
Problem: Alteration in mood Goal: LTG-Patient reports reduction in suicidal thoughts (Patient reports reduction in suicidal thoughts and is able to verbalize a safety plan for whenever patient is feeling suicidal)  Outcome: Progressing Pt denies SI and verbally contracts for safety.       

## 2016-02-02 NOTE — BH Assessment (Signed)
Tele Assessment Note   Caitlin Bell is an 25 y.o. female. [facility]. She reports she called her CPS worked [facility]  Pt reports she [facility] for bipolar d/o.   Diagnosis:  Bipolar I Disorder, Current Episode Depressed  Past Medical History:  Past Medical History  Diagnosis Date  . Cerebral palsy (HCC)     mild, speech impediment  . Anxiety   . Gestational diabetes   . Depression     Past Surgical History  Procedure Laterality Date  . Cholecystectomy  2014  . Hernia repair      Family History: No family history on file.  Social History:  reports that she quit smoking about 4 years ago. She has never used smokeless tobacco. She reports that she drinks alcohol. She reports that she does not use illicit drugs.  Additional Social History:  Alcohol / Drug Use Pain Medications: pt denies abuse - see PTA meds list Prescriptions: pt denies abuse - see PTA meds list Over the Counter: pt denies abuse - see PTA meds list History of alcohol / drug use?: Yes Substance #1 Name of Substance 1: alcohol 1 - Age of First Use: teenager 1 - Amount (size/oz): one small bottle vodka  1 - Frequency: daily 1 - Duration: month  CIWA:   COWS:    PATIENT STRENGTHS: (choose at least two) Average or above average intelligence Capable of independent living Communication skills General fund of knowledge Physical Health  Allergies: No Known Allergies  Home Medications:  (Not in a hospital admission)  OB/GYN Status:  Patient's last menstrual period was 01/14/2016.  General Assessment Data [facility] TTS Assessment: In system Is this a Tele or Face-to-Face Assessment?: Face-to-Face Is this an Initial Assessment or a Re-assessment for this encounter?: Initial Assessment Marital status: Single Is patient pregnant?: Unknown Pregnancy Status: Unknown Living Arrangements:  Children (3 kids - 2 yo, 1 yo, 5 mo) Can pt return to current living arrangement?: Yes Admission Status: Voluntary Is patient capable of signing voluntary admission?: Yes Referral Source: Self/Family/Friend Insurance type: self pay     Crisis Care Plan Living Arrangements: Children (3 kids - 2 yo, 1 yo, 5 mo) [facility] [place] [facility] [place]  Education Status Is patient currently in school?: No Highest grade of school patient has completed: 12  Risk to self with the past 6 months Suicidal Ideation: Yes-Currently Present Has patient been a risk to self within the past 6 months prior to admission? : Yes Suicidal Intent: No Has patient had any suicidal intent within the past 6 months prior to admission? : No Is patient at risk for suicide?: Yes Suicidal Plan?: No Has patient had any suicidal plan within the past 6 months prior to admission? : No Access to Means:  (n/a) What has been your use of drugs/alcohol within the last 12 months?: she daily drinks small bottle vodka Previous Attempts/Gestures: No How many times?: 0 Other Self Harm Risks: none Triggers for Past Attempts:  (n/a) Intentional Self Injurious Behavior: None Family Suicide History: No (depression and anxiety on maternal side) Recent stressful life event(s): Other (Comment), Financial Problems (got her 3 kids back one month ago, depression & anxiety) Persecutory voices/beliefs?: No Depression: Yes Depression Symptoms: Insomnia, Isolating, Fatigue, Guilt, Loss of interest in usual pleasures, Feeling worthless/self pity, Feeling angry/irritable Substance abuse history and/or treatment for substance abuse?:  No Suicide prevention information given to non-admitted patients: Not applicable  Risk to Others within the past 6 months Homicidal Ideation: No Does patient have any lifetime risk of violence toward others beyond the six months prior to admission? : No Thoughts  of Harm to Others: No Current Homicidal Intent: No Current Homicidal Plan: No Access to Homicidal Means: No Identified Victim: none History of harm to others?: No Assessment of Violence: None Noted Violent Behavior Description: pt denies hx violence - is calm and cooperatiev Does patient have access to weapons?: No Criminal Charges Pending?: No Does patient have a court date: No Is patient on probation?: No  Psychosis Hallucinations: Visual (seeing people standing over her bed) Delusions: None noted  Mental Status Report Appearance/Hygiene: Unremarkable Eye Contact: Good Motor Activity: Freedom of movement (pt's arm crossed b/c she says she is cold) Speech: Logical/coherent Level of Consciousness: Alert Mood: Depressed, Anxious, Anhedonia, Guilty, Sad, Irritable Affect: Appropriate to circumstance, Blunted Anxiety Level: Moderate Thought Processes: Relevant, Coherent Judgement: Unimpaired Orientation: Person, Place, Time, Situation Obsessive Compulsive Thoughts/Behaviors: None  Cognitive Functioning Concentration: Decreased Memory: Recent Impaired, Remote Intact IQ: Average Insight: Fair Impulse Control: Fair Appetite: Poor Sleep: Decreased Total Hours of Sleep: 1 Vegetative Symptoms: None  ADLScreening [facility] Assessment Services) Patient's cognitive ability adequate to safely complete daily activities?: Yes Patient able to express need for assistance with ADLs?: Yes Independently performs ADLs?: Yes (appropriate for developmental age)  Prior Inpatient Therapy Prior Inpatient Therapy: Yes Prior Therapy Dates: 2010 Prior Therapy Facilty/Provider(s): [facility] Reason for Treatment: bipolar d/o  Prior Outpatient Therapy Prior Outpatient Therapy: Yes Prior Therapy Dates: currently Prior Therapy Facilty/Provider(s): [facility] Reason for Treatment: therapy, med management Does patient have an ACCT team?: No Does patient have Intensive In-House Services?  : No Does  patient have [facility] services? : No Does patient have P4CC services?: Unknown  ADL Screening (condition at time of admission) Patient's cognitive ability adequate to safely complete daily activities?: Yes Is the patient deaf or have difficulty hearing?: No Does the patient have difficulty seeing, even when wearing glasses/contacts?: No Does the patient have difficulty concentrating, remembering, or making decisions?: Yes Patient able to express need for assistance with ADLs?: Yes Does the patient have difficulty dressing or bathing?: No Independently performs ADLs?: Yes (appropriate for developmental age) Does the patient have difficulty walking or climbing stairs?: No Weakness of Legs: None Weakness of Arms/Hands: None  Home Assistive Devices/Equipment Home Assistive Devices/Equipment: None    Abuse/Neglect Assessment (Assessment to be complete while patient is alone) Physical Abuse: Denies Verbal Abuse: Denies Sexual Abuse: Denies Exploitation of patient/patient's resources: Denies Self-Neglect: Denies     Merchant navy officer (For Healthcare) Does patient have an advance directive?: No Would patient like information on creating an advanced directive?: No - patient declined information    Additional Information 1:1 In Past 12 Months?: No CIRT Risk: No Elopement Risk: No Does patient have medical clearance?: No     Disposition:  Disposition Initial Assessment Completed for this Encounter: Yes Disposition of Patient: Inpatient treatment program Type of inpatient treatment program: Adult (laura davis np recommends inpatient treatment)  Caitlin Bell P 02/02/2016 11:35 AM

## 2016-02-02 NOTE — BH Assessment (Signed)
[facility] Assessment Progress Note  Per Nanine MeansJamison Lord, DNP, this pt requires psychiatric hospitalization at this time.  Berneice Heinrichina Tate, RN, [facility] has assigned pt to Rm 403-1.  Pt has signed Voluntary Admission and Consent for Treatment, as well as Consent to Release Information to [facility] [place], her outpatient provider, as well as her mother, and a notification call has been placed to the former.  S[facility].  Pt's nurse, Diane, has been notified, and agrees to send original paperwork along with pt via [facility] [place], and to call report to [phone].  Doylene Canninghomas Keiara Sneeringer, MA Triage Specialist [phone]

## 2016-02-02 NOTE — Progress Notes (Signed)
D: Pt has anxious affect and mood.  She reports her day has been "okay."  Pt reports her goal is to "have a good night."  Pt denies SI/HI, denies pain.  Pt reports visual hallucinations, stating "I'll see people out of the corner of my eye."  Pt has been visible in milieu interacting with peers and staff appropriately.  Pt attended evening group.   A: Introduced self to pt.  Met with pt and offered support and encouragement.  Actively listened to pt.  Medications administered per order.  PRN medication administered for anxiety. R: Pt is compliant with medications.  Pt verbally contracts for safety.  Will continue to monitor and assess.

## 2016-02-02 NOTE — Progress Notes (Signed)
Admission Note:  D-25 year old female who presents voluntary, in no acute distress, for the treatment of Depression, Anxiety, and Psychosis. Patient appears flat and depressed. Patient was anxious, fidgety, and cooperative with admission process. Patient currently denies SI/HI and contracts for safety upon admission. Patient reports VH; "I see shadows of figures and I feel like they're trying to kill me".  Patient reports that she has been having these visual hallucinations for "about a month".  Patient reports poor sleep stating "I haven't slept in 24 hours".  Patient report stressors of small children in the home, CPS recently returning children to patient due to patient being homeless, and increasing panic and anxiety attacks. A- Skin was assessed and found to be clear of any abnormal marks apart from bruise to left forearm and bruise to right leg.  Patient unable to identify source of bruises.  Patient searched and no contraband found, POC and unit policies explained and understanding verbalized. Consents obtained.   R- Patient had no additional questions or concerns.

## 2016-02-02 NOTE — ED Notes (Signed)
Pt states that she is here because she is seeing shadows. She denies SI and HI. Per pt, she has a history of depression and Bipolar. Previously she took Abilify, but it did not help her moods.

## 2016-02-02 NOTE — Tx Team (Signed)
Initial Interdisciplinary Treatment Plan   PATIENT STRESSORS: Financial difficulties Marital or family conflict Occupational concerns   PATIENT STRENGTHS: Capable of independent living Motivation for treatment/growth Physical Health Supportive family/friends   PROBLEM LIST: Problem List/Patient Goals Date to be addressed Date deferred Reason deferred Estimated date of resolution  Psychosis 02/02/2016  02/02/2016   D/C  Anxiety 02/02/2016  02/02/2016   D/C  Depression 02/02/2016  02/02/2016   D/C  "Coping Skills" 02/02/2016  02/02/2016   D/C  "Trying to deal with stress" 02/02/2016  02/02/2016   D/C                           DISCHARGE CRITERIA:  Ability to meet basic life and health needs Improved stabilization in mood, thinking, and/or behavior Motivation to continue treatment in a less acute level of care Need for constant or close observation no longer present Reduction of life-threatening or endangering symptoms to within safe limits  PRELIMINARY DISCHARGE PLAN: Outpatient therapy Return to previous living arrangement  PATIENT/FAMIILY INVOLVEMENT: This treatment plan has been presented to and reviewed with the patient, Caitlin Bell.  The patient and family have been given the opportunity to ask questions and make suggestions.  Larry SierrasMiddleton, Adam Sanjuan P 02/02/2016, 8:12 PM

## 2016-02-03 ENCOUNTER — Encounter (HOSPITAL_COMMUNITY): Payer: Self-pay | Admitting: Psychiatry

## 2016-02-03 DIAGNOSIS — F315 Bipolar disorder, current episode depressed, severe, with psychotic features: Principal | ICD-10-CM

## 2016-02-03 MED ORDER — ARIPIPRAZOLE 5 MG PO TABS
5.0000 mg | ORAL_TABLET | Freq: Every day | ORAL | Status: DC
  Administered 2016-02-03: 5 mg via ORAL
  Filled 2016-02-03 (×3): qty 1

## 2016-02-03 MED ORDER — ZOLPIDEM TARTRATE 5 MG PO TABS
5.0000 mg | ORAL_TABLET | Freq: Every evening | ORAL | Status: DC | PRN
  Administered 2016-02-03: 5 mg via ORAL
  Filled 2016-02-03: qty 1

## 2016-02-03 MED ORDER — ONDANSETRON 4 MG PO TBDP
4.0000 mg | ORAL_TABLET | Freq: Three times a day (TID) | ORAL | Status: DC | PRN
  Administered 2016-02-03 – 2016-02-05 (×4): 4 mg via ORAL
  Filled 2016-02-03 (×4): qty 1

## 2016-02-03 NOTE — Progress Notes (Signed)
Recreation Therapy Notes  Animal-Assisted Activity (AAA) Program Checklist/Progress Notes Patient Eligibility Criteria Checklist & Daily Group note for Rec Tx Intervention  Date: 05.02.2017 Time: 2:45pm [facility] [address] [facility]   AAA/T Program Assumption of Risk Form signed by Patient/ or Parent Legal Guardian Yes  Patient is free of allergies or sever asthma Yes  Patient reports no fear of animals Yes  Patient reports no history of cruelty to animals Yes  Patient understands his/her participation is voluntary Yes  Behavioral Response: Did not attend.    Marykay Lexenise L Kj Imbert, LRT/CTRS       Ernesha Ramone L 02/03/2016 3:00 PM

## 2016-02-03 NOTE — [facility] Group Notes (Signed)
[facility] Group Notes:  (Nursing/MHT/Case Management/Adjunct)  Date:  02/03/2016  Time:  6:12 PM   Type of Therapy:  Psychoeducational Skills  Participation Level:  Did Not Attend  Participation Quality:  DID NOT ATTEND  Affect:  DID NOT ATTEND  Cognitive:  DID NOT ATTEND  Insight:  None  Engagement in Group:  DID NOT ATTEND  Modes of Intervention:  DID NOT ATTEND  Summary of Progress/Problems: Pt did not attend patient self inventory group.  Bethann PunchesJane O Alexine Pilant 02/03/2016, 6:12 PM

## 2016-02-03 NOTE — H&P (Addendum)
Psychiatric Admission Assessment Adult  Patient Identification: Caitlin Bell MRN:  [id number] Date of Evaluation:  02/03/2016 Chief Complaint:   " I have been depressed, anxious " Principal Diagnosis:  MDD, Consider Bipolar Depression  Diagnosis:   Patient Active Problem List   Diagnosis Date Noted  . Bipolar affective disorder, depressed, severe, with psychotic behavior ([place]) [F31.5] 02/02/2016  . Normal labor [O80, Z37.9] 10/31/2014  . Bipolar disorder ([place]) [F31.9] 10/31/2014  . Cholestasis of pregnancy [O26.619, K83.1] 10/31/2014  . H/O anorexia nervosa [Z86.59] 10/31/2014  . H/O bulimia nervosa [Z86.59] 10/31/2014  . Gestational diabetes [O24.419] 10/31/2014  . Depression--on Latuda [F32.9] 10/31/2014  . Umbilical hernia [H08.6] 10/31/2014  . Hx of preterm delivery  [O09.219] 10/31/2014  . Preterm delivery [O60.10X0] 10/31/2014  . Generalized anxiety disorder [F41.1] 10/31/2014  . History of marijuana use [Z87.898] 10/31/2014  . Cerebral palsy ([place]) [G80.9] 10/31/2014  . [redacted] weeks gestation of pregnancy [Z3A.35]   . Hx of PTL (preterm labor), current pregnancy [O09.219] 10/30/2014  . Preterm labor [O60.10X0] 10/30/2014  . Back pain complicating pregnancy in third trimester [O26.893, M54.9] 10/14/2014   History of Present Illness:: 25 year old female. Patient states she has had recent brief hallucinations, where she sees " shadows going by". She states these are recent, and states she feels they are related to anxiety, depression . States she has been depressed over the last few weeks because she had a CPS case open and her children had been taken and given to mother, " because of concerns about my mental health". She also reports worsening anxiety and recent panic attacks . Associated Signs/Symptoms: Depression Symptoms:  depressed mood, anhedonia, insomnia, suicidal thoughts without plan, loss of energy/fatigue, decreased appetite, has lost several pounds over the  last few weeks  (Hypo) Manic Symptoms:  Describes brief episodes of feeling " happy for an hour or two", but does not endorse any hypomanic or manic symptoms at this time. Anxiety Symptoms:   Has had recent panic attacks,  Describes mild agoraphobia, and tendency to worry . Psychotic Symptoms:  Describes brief episodes where she sees " shadows". Denies auditory or tactile hallucinations  PTSD Symptoms: Denies PTSD symptoms Total Time spent with patient: 45 minutes  Past Psychiatric History:  Has had several psychiatric admissions ( most recently two years ago)  , at [facility], for depression and anxiety.   States she has had episodes of hypomania in the past and has been diagnosed with Bipolar Disorder in the past . Denies history of suicide attempts, history of self cutting , but states she stopped about one year ago. Describes, as above, panic attacks, which she states are of recent onset, x 1 month, and also recent onset psychotic symptoms.  Denies history of violence   Is the patient at risk to self? Yes.    Has the patient been a risk to self in the past 6 months? Yes.    Has the patient been a risk to self within the distant past? Yes.    Is the patient a risk to others? No.  Has the patient been a risk to others in the past 6 months? No.  Has the patient been a risk to others within the distant past? No.   Prior Inpatient Therapy: Prior Inpatient Therapy: Yes Prior Therapy Dates: 2010 Prior Therapy Facilty/Provider(s): [facility] Reason for Treatment: bipolar d/o Prior Outpatient Therapy: Prior Outpatient Therapy: Yes Prior Therapy Dates: currently Prior Therapy Facilty/Provider(s): [facility] Reason for Treatment: therapy, med management  Does patient have an ACCT team?: No Does patient have Intensive In-House Services?  : No Does patient have [facility] services? : No Does patient have P4CC services?: Unknown  Alcohol Screening: 1. How often do you have a drink containing  alcohol?: 2 to 4 times a month 2. How many drinks containing alcohol do you have on a typical day when you are drinking?: 1 or 2 3. How often do you have six or more drinks on one occasion?: Never Preliminary Score: 0 9. Have you or someone else been injured as a result of your drinking?: No 10. Has a relative or friend or a doctor or another health worker been concerned about your drinking or suggested you cut down?: No Alcohol Use Disorder Identification Test Final Score (AUDIT): 2 Brief Intervention: AUDIT score less than 7 or less-screening does not suggest unhealthy drinking-brief intervention not indicated Substance Abuse History in the last 12 months:  States she drinks once or twice a month, sometimes with purpose of getting intoxicated, up to " 6 smirnoffs at a time, but not regularly. Denies any cannabis or other drug abuse  Consequences of Substance Abuse: Denies , denies WDL seizures, denies history of blackouts  Previous Psychotropic Medications:  States she has been on Abilify x 3 weeks- states she does not feel it is helping . In the past remembers being on Depakote, Latuda. States Depakote was poorly tolerated due to nausea  Psychological Evaluations:  No  Past Medical History: denies  Past Medical History  Diagnosis Date  . Cerebral palsy (HCC)     mild, speech impediment  . Anxiety   . Gestational diabetes   . Depression     Past Surgical History  Procedure Laterality Date  . Cholecystectomy  2014  . Hernia repair     Family History: parents alive, divorced. Has a good relationship with mother, no contact with father. Has two siblings  Family Psychiatric  History: States sister has history of Bipolar , states someone in her father's family committed suicide but is not sure of who, states there is a history of substance abuse in her father's family  Tobacco Screening: smokes about 1 cigarette per day  Social History: single, lives with her children, ages 2,1,5 months-  currently children are with patient's mother. States has little support from children's father, unemployed, no source of income , no legal issues, as noted recent CPS investigation .  History  Alcohol Use  . Yes    Comment: ocasionally, not with the pregnancy     History  Drug Use No    Comment: hx of marijuana use 3 weeks ago    Additional Social History: Marital status: Single    Pain Medications: pt denies abuse - see PTA meds list Prescriptions: pt denies abuse - see PTA meds list Over the Counter: pt denies abuse - see PTA meds list History of alcohol / drug use?: Yes Name of Substance 1: alcohol 1 - Age of First Use: teenager 1 - Amount (size/oz): one small bottle vodka  1 - Frequency: daily 1 - Duration: month  Allergies:  No Known Allergies Lab Results:  Results for orders placed or performed during the hospital encounter of 02/02/16 (from the past 48 hour(s))  Comprehensive metabolic panel     Status: None   Collection Time: 02/02/16 12:03 PM  Result Value Ref Range   Sodium 138 135 - 145 mmol/L   Potassium 4.0 3.5 - 5.1 mmol/L   Chloride 103 101 -  111 mmol/L   CO2 25 22 - 32 mmol/L   Glucose, Bld 94 65 - 99 mg/dL   BUN 11 6 - 20 mg/dL   Creatinine, Ser 0.72 0.44 - 1.00 mg/dL   Calcium 9.2 8.9 - 10.3 mg/dL   Total Protein 7.3 6.5 - 8.1 g/dL   Albumin 4.5 3.5 - 5.0 g/dL   AST 26 15 - 41 U/L   ALT 35 14 - 54 U/L   Alkaline Phosphatase 97 38 - 126 U/L   Total Bilirubin 0.6 0.3 - 1.2 mg/dL   GFR calc non Af Amer >60 >60 mL/min   GFR calc Af Amer >60 >60 mL/min    Comment: (NOTE) The eGFR has been calculated using the CKD EPI equation. This calculation has not been validated in all clinical situations. eGFR's persistently <60 mL/min signify possible Chronic Kidney Disease.    Anion gap 10 5 - 15  cbc     Status: None   Collection Time: 02/02/16 12:03 PM  Result Value Ref Range   WBC 5.7 4.0 - 10.5 K/uL   RBC 4.50 3.87 - 5.11 MIL/uL   Hemoglobin 12.4 12.0 -  15.0 g/dL   HCT 37.1 36.0 - 46.0 %   MCV 82.4 78.0 - 100.0 fL   MCH 27.6 26.0 - 34.0 pg   MCHC 33.4 30.0 - 36.0 g/dL   RDW 14.6 11.5 - 15.5 %   Platelets 290 150 - 400 K/uL  Ethanol     Status: None   Collection Time: 02/02/16 12:06 PM  Result Value Ref Range   Alcohol, Ethyl (B) <5 <5 mg/dL    Comment:        LOWEST DETECTABLE LIMIT FOR SERUM ALCOHOL IS 5 mg/dL FOR MEDICAL PURPOSES ONLY   Salicylate level     Status: None   Collection Time: 02/02/16 12:06 PM  Result Value Ref Range   Salicylate Lvl <1.6 2.8 - 30.0 mg/dL  Acetaminophen level     Status: Abnormal   Collection Time: 02/02/16 12:06 PM  Result Value Ref Range   Acetaminophen (Tylenol), Serum <10 (L) 10 - 30 ug/mL    Comment:        THERAPEUTIC CONCENTRATIONS VARY SIGNIFICANTLY. A RANGE OF 10-30 ug/mL MAY BE AN EFFECTIVE CONCENTRATION FOR MANY PATIENTS. HOWEVER, SOME ARE BEST TREATED AT CONCENTRATIONS OUTSIDE THIS RANGE. ACETAMINOPHEN CONCENTRATIONS >150 ug/mL AT 4 HOURS AFTER INGESTION AND >50 ug/mL AT 12 HOURS AFTER INGESTION ARE OFTEN ASSOCIATED WITH TOXIC REACTIONS.   I-Stat beta hCG blood, ED     Status: None   Collection Time: 02/02/16 12:14 PM  Result Value Ref Range   I-stat hCG, quantitative <5.0 <5 mIU/mL   Comment 3            Comment:   GEST. AGE      CONC.  (mIU/mL)   <=1 WEEK        5 - 50     2 WEEKS       50 - 500     3 WEEKS       100 - 10,000     4 WEEKS     1,000 - 30,000        FEMALE AND NON-PREGNANT FEMALE:     LESS THAN 5 mIU/mL   Rapid urine drug screen (hospital performed)     Status: None   Collection Time: 02/02/16  1:20 PM  Result Value Ref Range   Opiates NONE DETECTED NONE DETECTED   Cocaine  NONE DETECTED NONE DETECTED   Benzodiazepines NONE DETECTED NONE DETECTED   Amphetamines NONE DETECTED NONE DETECTED   Tetrahydrocannabinol NONE DETECTED NONE DETECTED   Barbiturates NONE DETECTED NONE DETECTED    Comment:        DRUG SCREEN FOR MEDICAL PURPOSES ONLY.  IF  CONFIRMATION IS NEEDED FOR ANY PURPOSE, NOTIFY LAB WITHIN 5 DAYS.        LOWEST DETECTABLE LIMITS FOR URINE DRUG SCREEN Drug Class       Cutoff (ng/mL) Amphetamine      1000 Barbiturate      200 Benzodiazepine   294 Tricyclics       765 Opiates          300 Cocaine          300 THC              50     Blood Alcohol level:  Lab Results  Component Value Date   ETH <5 46/50/3546    Metabolic Disorder Labs:  No results found for: HGBA1C, MPG No results found for: PROLACTIN No results found for: CHOL, TRIG, HDL, CHOLHDL, VLDL, LDLCALC  Current Medications: Current Facility-Administered Medications  Medication Dose Route Frequency Provider Last Rate Last Dose  . acetaminophen (TYLENOL) tablet 650 mg  650 mg Oral Q6H PRN Patrecia Pour, NP      . alum & mag hydroxide-simeth (MAALOX/MYLANTA) 200-200-20 MG/5ML suspension 30 mL  30 mL Oral Q4H PRN Patrecia Pour, NP      . ARIPiprazole (ABILIFY) tablet 2 mg  2 mg Oral QHS Patrecia Pour, NP   2 mg at 02/02/16 2109  . ibuprofen (ADVIL,MOTRIN) tablet 600 mg  600 mg Oral Q8H PRN Patrecia Pour, NP      . LORazepam (ATIVAN) tablet 1 mg  1 mg Oral Q8H PRN Patrecia Pour, NP   1 mg at 02/03/16 1255  . magnesium hydroxide (MILK OF MAGNESIA) suspension 30 mL  30 mL Oral Daily PRN Patrecia Pour, NP      . nicotine (NICODERM CQ - dosed in mg/24 hours) patch 21 mg  21 mg Transdermal Daily Patrecia Pour, NP   21 mg at 02/03/16 5681   PTA Medications: Prescriptions prior to admission  Medication Sig Dispense Refill Last Dose  . ARIPiprazole (ABILIFY) 2 MG tablet Take 2 mg by mouth at bedtime.   Past Week at Unknown time    Musculoskeletal: Strength & Muscle Tone: within normal limits Gait & Station: normal Patient leans: N/A  Psychiatric Specialty Exam: Physical Exam  Review of Systems  Constitutional: Negative.   Eyes: Negative.   Respiratory: Negative.   Cardiovascular: Negative.   Gastrointestinal: Positive for nausea. Negative  for heartburn, vomiting, abdominal pain, diarrhea and blood in stool.  Genitourinary: Negative.   Musculoskeletal: Negative.   Skin: Negative.   Neurological: Positive for speech change and headaches. Negative for seizures.       Slight dysarthria related to cerebral palsy  Psychiatric/Behavioral: Positive for depression and suicidal ideas.  All other systems reviewed and are negative.   Blood pressure 117/67, pulse 125, temperature 97.8 F (36.6 C), temperature source Oral, resp. rate 18, height 5' 3.5" (1.613 m), weight 119 lb (53.978 kg), last menstrual period 01/14/2016, not currently breastfeeding.Body mass index is 20.75 kg/(m^2).  General Appearance: Fairly Groomed  Engineer, water::  Good  Speech:  Normal Rate- slightly dysarthric   Volume:  Decreased  Mood:  somewhat depressed, but states she is  feeling better today  Affect:  Constricted  Thought Process:  Linear  Orientation:  Other:  fully alert and attentive   Thought Content:  Rumination- states she has had occasional visual hallucinations, no delusions expressed   Suicidal Thoughts:  No denies any current suicidal ideations or self injurious ideations, is able to contract for safety on the unit   Homicidal Thoughts:  No  Memory:  recent and remote grossly intact   Judgement:  Fair  Insight:  Fair  Psychomotor Activity:  Normal  Concentration:  Good  Recall:  Good  Fund of Knowledge:Good  Language: Good  Akathisia:  Negative  Handed:  Right  AIMS (if indicated):     Assets:  Communication Skills Desire for Improvement Resilience  ADL's:  Intact  Cognition: WNL  Sleep:  Number of Hours: 6.5     Treatment Plan Summary: Daily contact with patient to assess and evaluate symptoms and progress in treatment, Medication management, Plan inpatient admission  and medications as above   Observation Level/Precautions:  15 minute checks  Laboratory:  as needed- TSH, Lipid panel, HgbA1C, Prolactin   Psychotherapy:  Milieu,  support   Medications:  We discussed options- patient states that Abilify is well tolerated, but she feels current dose ( 2 mgrs daily) insufficient to control symptoms Increase Abilify to 5 mgrs QDAY  Ambien 5 mgrs QHS for insomnia as needed - patient states she has side effects to Trazodone   Consultations:  As needed   Discharge Concerns: -    Estimated LOS: 5-6 days   Other:     I certify that inpatient services furnished can reasonably be expected to improve the patient's condition.    Neita Garnet, MD 5/2/20174:23 PM

## 2016-02-03 NOTE — Progress Notes (Signed)
DAR NOTE: Pt present with flat affect and depressed mood in the unit. Pt has been in the dayroom interacting with peers. Pt denies physical pain, took all her meds as scheduled. As per self inventory, pt had a good night sleep, poor appetite, low energy, and poor concentration. Pt rate depression at 7, hopeless ness at 7, and anxiety at 7. Pt's safety ensured with 15 minute and environmental checks. Pt currently denies SI/HI and A/V hallucinations. Pt verbally agrees to seek staff if SI/HI or A/VH occurs and to consult with staff before acting on these thoughts. Will continue POC.

## 2016-02-03 NOTE — Progress Notes (Signed)
Adult Psychoeducational Group Note  Date:  02/03/2016 Time:  9:05 PM  Group Topic/Focus:  Wrap-Up Group:   The focus of this group is to help patients review their daily goal of treatment and discuss progress on daily workbooks.  Participation Level:  Active  Participation Quality:  Appropriate  Affect:  Appropriate  Cognitive:  Alert  Insight: Appropriate  Engagement in Group:  Engaged  Modes of Intervention:  Discussion  Additional Comments:  Pt rated her day 7/10. The question that was asked to her was who she looked up to? Her response was her mom.   Kaleen OdeaCOOKE, Sholonda Jobst R 02/03/2016, 9:05 PM
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
## 2016-02-03 NOTE — [facility] Group Notes (Signed)
[facility] LCSW Group Therapy 02/03/2016  1:15 PM   Type of Therapy: Group Therapy  Participation Level: Did Not Attend. Patient invited to participate but declined.   Davion Flannery, MSW, LCSW Clinical Social Worker [facility] [phone]   

## 2016-02-04 LAB — TSH: TSH: 2.524 u[IU]/mL (ref 0.350–4.500)

## 2016-02-04 LAB — LIPID PANEL
CHOL/HDL RATIO: 2.6 ratio
CHOLESTEROL: 139 mg/dL (ref 0–200)
HDL: 53 mg/dL (ref >40)
LDL Cholesterol: 69 mg/dL (ref 0–99)
TRIGLYCERIDES: 86 mg/dL (ref <150)
VLDL: 17 mg/dL (ref 0–40)

## 2016-02-04 MED ORDER — QUETIAPINE FUMARATE 100 MG PO TABS
100.0000 mg | ORAL_TABLET | Freq: Every day | ORAL | Status: DC
  Administered 2016-02-04: 100 mg via ORAL
  Filled 2016-02-04 (×2): qty 1

## 2016-02-04 NOTE — [facility] Group Notes (Signed)
[facility] LCSW Aftercare Discharge Planning Group Note  02/04/2016  8:45 AM  Participation Quality: Did Not Attend. Patient invited to participate but declined.  Ilaria Much, MSW, LCSW Clinical Social Worker [facility] [phone]   

## 2016-02-04 NOTE — Progress Notes (Signed)
D: Pt presents anxious on approach. Pt reported depression 8/10. Anxiety 8/10. Pt endorses VH, seeing shadows. Pt verbalized having difficulty coping with VH and requested Ativan prn. Pt verbalized that the Ambien was not effective for sleep and Abilify is not effective with decreasing VH. Pt requested to be started on Seroquel at bedtime. Dr. Jama Flavorsobos made aware. A: Medications reviewed with pt. Medications administered as ordered per MD. Verbal support provided. Pt encouraged to attend groups.  R: Pt stated goal, "get meds straight". Pt receptive to tx.

## 2016-02-04 NOTE — Progress Notes (Addendum)
[facility] MD Progress Note  02/04/2016 12:09 PM Caitlin Bell  MRN:  [id number] Subjective:  Patient states she is feeling " a little better", but states she continues to feel depressed, anxious, and she does not feel current medication regimen is working well for her at this time. States she did not sleep well last night in spite of Ambien . Reports she spoke with family, and was told family members have done very well on Seroquel, so she wants to try this medication . Reports ongoing hallucinations, describes these as shadows , mostly fleeting or temporary- today describes these as occuring at night time after turns off lights, trying to sleep. Objective : I have discussed case with treatment team and have met with patient. Patient has been calm on unit, no disruptive or agitated behaviors. Group participation has been limited but improving and is currently more visible in milieu.  Denies medication side effects, no akathisia, but states she does not feel medications ( Abilify/Ambien ) working for her and today invested in trying Seroquel, which as above, she has heard from family this medication has helped other family members. At this time denies suicidal plan or intent and contracts for safety . Labs - TSH WNL, Lipid Panel unremarkable  Principal Problem: Bipolar affective disorder, depressed, severe, with psychotic behavior ([place]) Diagnosis:   Patient Active Problem List   Diagnosis Date Noted  . Bipolar affective disorder, depressed, severe, with psychotic behavior ([place]) [F31.5] 02/02/2016  . Normal labor [O80, Z37.9] 10/31/2014  . Bipolar disorder ([place]) [F31.9] 10/31/2014  . Cholestasis of pregnancy [O26.619, K83.1] 10/31/2014  . H/O anorexia nervosa [Z86.59] 10/31/2014  . H/O bulimia nervosa [Z86.59] 10/31/2014  . Gestational diabetes [O24.419] 10/31/2014  . Depression--on [place] [F32.9] 10/31/2014  . Umbilical hernia [B09.6] 10/31/2014  . Hx of preterm delivery  [O09.219] 10/31/2014   . Preterm delivery [O60.10X0] 10/31/2014  . Generalized anxiety disorder [F41.1] 10/31/2014  . History of marijuana use [Z87.898] 10/31/2014  . Cerebral palsy ([place]) [G80.9] 10/31/2014  . [redacted] weeks gestation of pregnancy [Z3A.35]   . Hx of PTL (preterm labor), current pregnancy [O09.219] 10/30/2014  . Preterm labor [O60.10X0] 10/30/2014  . Back pain complicating pregnancy in third trimester [O26.893, M54.9] 10/14/2014   Total Time spent with patient: 20 minutes    Past Medical History:  Past Medical History  Diagnosis Date  . Cerebral palsy (HCC)     mild, speech impediment  . Anxiety   . Gestational diabetes   . Depression     Past Surgical History  Procedure Laterality Date  . Cholecystectomy  2014  . Hernia repair     Family History: History reviewed. No pertinent family history.  Social History:  History  Alcohol Use  . Yes    Comment: ocasionally, not with the pregnancy     History  Drug Use No    Comment: hx of marijuana use 3 weeks ago    Social History   Social History  . Marital Status: Single    Spouse Name: N/A  . Number of Children: N/A  . Years of Education: N/A   Social History Main Topics  . Smoking status: Former Smoker    Quit date: 10/31/2011  . Smokeless tobacco: Never Used  . Alcohol Use: Yes     Comment: ocasionally, not with the pregnancy  . Drug Use: No     Comment: hx of marijuana use 3 weeks ago  . Sexual Activity: Yes    Birth Control/ Protection: None  Other Topics Concern  . None   Social History Narrative   Additional Social History:    Pain Medications: pt denies abuse - see PTA meds list Prescriptions: pt denies abuse - see PTA meds list Over the Counter: pt denies abuse - see PTA meds list History of alcohol / drug use?: Yes Name of Substance 1: alcohol 1 - Age of First Use: teenager 1 - Amount (size/oz): one small bottle vodka  1 - Frequency: daily 1 - Duration: month  Sleep: Good  Appetite:   Good  Current Medications: Current Facility-Administered Medications  Medication Dose Route Frequency Provider Last Rate Last Dose  . acetaminophen (TYLENOL) tablet 650 mg  650 mg Oral Q6H PRN Patrecia Pour, NP      . alum & mag hydroxide-simeth (MAALOX/MYLANTA) 200-200-20 MG/5ML suspension 30 mL  30 mL Oral Q4H PRN Patrecia Pour, NP      . ibuprofen (ADVIL,MOTRIN) tablet 600 mg  600 mg Oral Q8H PRN Patrecia Pour, NP      . LORazepam (ATIVAN) tablet 1 mg  1 mg Oral Q8H PRN Patrecia Pour, NP   1 mg at 02/04/16 2409  . magnesium hydroxide (MILK OF MAGNESIA) suspension 30 mL  30 mL Oral Daily PRN Patrecia Pour, NP      . nicotine (NICODERM CQ - dosed in mg/24 hours) patch 21 mg  21 mg Transdermal Daily Patrecia Pour, NP   21 mg at 02/03/16 0816  . ondansetron (ZOFRAN-ODT) disintegrating tablet 4 mg  4 mg Oral Q8H PRN Jenne Campus, MD   4 mg at 02/04/16 7353  . QUEtiapine (SEROQUEL) tablet 100 mg  100 mg Oral QHS Jenne Campus, MD        Lab Results:  Results for orders placed or performed during the hospital encounter of 02/02/16 (from the past 48 hour(s))  Lipid panel     Status: None   Collection Time: 02/04/16  6:11 AM  Result Value Ref Range   Cholesterol 139 0 - 200 mg/dL   Triglycerides 86 <150 mg/dL   HDL 53 >40 mg/dL   Total CHOL/HDL Ratio 2.6 RATIO   VLDL 17 0 - 40 mg/dL   LDL Cholesterol 69 0 - 99 mg/dL    Comment:        Total Cholesterol/HDL:CHD Risk Coronary Heart Disease Risk Table                     Men   Women  1/2 Average Risk   3.4   3.3  Average Risk       5.0   4.4  2 X Average Risk   9.6   7.1  3 X Average Risk  23.4   11.0        Use the calculated Patient Ratio above and the CHD Risk Table to determine the patient's CHD Risk.        ATP III CLASSIFICATION (LDL):  <100     mg/dL   Optimal  100-129  mg/dL   Near or Above                    Optimal  130-159  mg/dL   Borderline  160-189  mg/dL   High  >190     mg/dL   Very High Performed at  [facility]   TSH     Status: None   Collection Time: 02/04/16  6:11 AM  Result Value  Ref Range   TSH 2.524 0.350 - 4.500 uIU/mL    Comment: Performed at [facility]    Blood Alcohol level:  Lab Results  Component Value Date   [facility] <5 02/02/2016    Physical Findings: AIMS: Facial and Oral Movements Muscles of Facial Expression: None, normal Lips and Perioral Area: None, normal Jaw: None, normal Tongue: None, normal,Extremity Movements Upper (arms, wrists, hands, fingers): None, normal Lower (legs, knees, ankles, toes): None, normal, Trunk Movements Neck, shoulders, hips: None, normal, Overall Severity Severity of abnormal movements (highest score from questions above): None, normal Incapacitation due to abnormal movements: None, normal Patient's awareness of abnormal movements (rate only patient's report): No Awareness, Dental Status Current problems with teeth and/or dentures?: No Does patient usually wear dentures?: No  CIWA:    COWS:     Musculoskeletal: Strength & Muscle Tone: within normal limits Gait & Station: normal Patient leans: N/A  Psychiatric Specialty Exam: ROS no headache, no vomiting , no rash   Blood pressure 115/86, pulse 92, temperature 98 F (36.7 C), temperature source Oral, resp. rate 16, height 5' 3.5" (1.613 m), weight 119 lb (53.978 kg), last menstrual period 01/14/2016, not currently breastfeeding.Body mass index is 20.75 kg/(m^2).  General Appearance: Fairly Groomed  Engineer, water::  Good  Speech:  Normal Rate  Volume:  Decreased  Mood:  still depressed, but improving   Affect:  more reactive, smiles at times appropriately   Thought Process:  Linear  Orientation:  Other:  fully alert and attentive   Thought Content:  states " shadows" have continued to occur- describes these as human form shadows, mostly fleeting , she perceives them only at night, usually after turns off light and is trying to sleep. No delusions  expressed, does not appear internally preoccupied   Suicidal Thoughts:  No at this time denies any self injurious or suicidal ideations , plan or intention   Homicidal Thoughts:  No  Memory:  recent and remote grossly intact   Judgement:  Fair  Insight:  Fair  Psychomotor Activity:  Normal  Concentration:  Good  Recall:  Good  Fund of Knowledge:Good  Language: Good  Akathisia:  Negative  Handed:  Right  AIMS (if indicated):     Assets:  Desire for Improvement Resilience  ADL's:  Intact  Cognition: WNL  Sleep:  Number of Hours: 6.25  Assessment - patient presents with some improvement , but still depressed, constricted, with soft speech. She endorses visual hallucinations but today's description of these ( occur at night, lights out, when trying to sleep ) may suggest hypnagogic experiences. At this time not internally preoccupied or overtly psychotic . Interested in Seroquel trial, which she states has helped family members- side effects discussed  Treatment Plan Summary: Daily contact with patient to assess and evaluate symptoms and progress in treatment, Medication management, Plan inpatient treatment  and medications as below  Encourage group, milieu attendance, to work on coping skills and symptom reduction Continue Ativan 1 mgr Q 8 hours PRN for anxiety , as needed  D/C Abilify D/C Ambien Start Seroquel 100 mgrs QHS for mood disorder, psychotic symptoms- side effects, to include risk of sedation, movement disorders, TD, metabolic side effects, weight gain, discussed   Neita Garnet, MD 02/04/2016, 12:09 PM

## 2016-02-04 NOTE — Progress Notes (Signed)
Recreation Therapy Notes  Date: 05.03.2017 Time: 9:30am Location: [address] Group Room   Group Topic: Stress Management  Goal Area(s) Addresses:  Patient will actively participate in stress management techniques presented during session.   Behavioral Response: Did not attend.   Marykay Lexenise L Ricki Vanhandel, LRT/CTRS         Saragrace Selke L 02/04/2016 1:34 PM

## 2016-02-04 NOTE — Plan of Care (Signed)
Problem: Alteration in mood Goal: STG-Patient is able to discuss feelings and issues (Patient is able to discuss feelings and issues leading to depression)  Outcome: Progressing Pt expressed to writer increased depression, poor sleep and VH.

## 2016-02-04 NOTE — Progress Notes (Signed)
Adult Psychoeducational Group Note  Date:  02/04/2016 Time:  9:39 PM  Group Topic/Focus:  Wrap-Up Group:   The focus of this group is to help patients review their daily goal of treatment and discuss progress on daily workbooks.  Participation Level:  Active  Participation Quality:  Appropriate  Affect:  Appropriate  Cognitive:  Alert  Insight: Appropriate  Engagement in Group:  Engaged  Modes of Intervention:  Discussion  Additional Comments:  Pt rated her day 3/10. She stated that your medications are not working and that she did not sleep well last night. Pt also participated in a group activity.   Flonnie HailstoneCOOKE, Inaya Gillham R 02/04/2016, 9:39 PM

## 2016-02-04 NOTE — Progress Notes (Addendum)
Patient ID: Caitlin RudVictoria L Bell, female   DOB: 12/22/1990, 25 y.o.   MRN: [id number] D: Client visible on unit, in dayroom watching TV. Client reports anxiety and pain "8" of 10, depression "5" of 10. Client reports of her day "it was okay" "Just happy to see my family"  A: Writer encouraged client to use coping skills as well with anxiety i.e. Deep breathing, thinking on positive experiences etc. Medications reviewed, administered as ordered. Staff will monitor q6315min for safety. R: Client is safe on the unit, attended group.

## 2016-02-04 NOTE — Progress Notes (Signed)
Caitlin Bell states today was "ok". Her goal today was to talk to the doctor and get her medications adjusted. She feels as if her abilify hasn't been working and is anticipating some improvement of her mood with recent changes made. She states she feels like she needs a mood stabilizer and she also states the [place] has been minimally effective and would like to discuss seroquel for sleep. She is endorsing seeing shadows. Denies SI/HI/VH. Encouragement and support given. Medications administered as prescribed. Continue Q 15 minute checks for patient safety and medication effectiveness.

## 2016-02-04 NOTE — Tx Team (Addendum)
Interdisciplinary Treatment Plan Update (Adult) Date: 02/04/2016    Time Reviewed: 9:30 AM  Progress in Treatment: Attending groups: Continuing to assess, patient new to milieu Participating in groups: Continuing to assess, patient new to milieu Taking medication as prescribed: Yes Tolerating medication: Yes Family/Significant other contact made: No, CSW assessing for appropriate contacts Patient understands diagnosis: Yes Discussing patient identified problems/goals with staff: Yes Medical problems stabilized or resolved: Yes Denies suicidal/homicidal ideation: Yes Issues/concerns per patient self-inventory: Yes Other:  New problem(s) identified: N/A  Discharge Plan or Barriers: CSW continuing to assess, patient new to milieu.  Reason for Continuation of Hospitalization:  Depression Anxiety Medication Stabilization   Comments: N/A  Estimated length of stay: 3-5 days    Patient is a 25 year old female with a history of Bipolar Disorder. Pt presented to the hospital with SI, hallucinations, insomnia, panic attacks, and alcohol abuse. Pt reports primary trigger(s) for admission was CPS involvement., housing and family stressors. Patient will benefit from crisis stabilization, medication evaluation, group therapy and psycho education in addition to case management for discharge planning. At discharge, it is recommended that Pt remain compliant with established discharge plan and continued treatment.   Review of initial/current patient goals per problem list:  1. Goal(s): Patient will participate in aftercare plan   Met: No   Target date: 3-5 days post admission date   As evidenced by: Patient will participate within aftercare plan AEB aftercare provider and housing plan at discharge being identified.   5/3: Goal not met: CSW assessing for appropriate referrals for pt and will have follow up secured prior to d/c.   2. Goal (s): Patient will exhibit decreased depressive  symptoms and suicidal ideations.   Met: No   Target date: 3-5 days post admission date   As evidenced by: Patient will utilize self rating of depression at 3 or below and demonstrate decreased signs of depression or be deemed stable for discharge by MD.  5/2: Patient rates her depression at 7, denies SI.    3. Goal(s): Patient will demonstrate decreased signs and symptoms of anxiety.   Met: No   Target date: 3-5 days post admission date   As evidenced by: Patient will utilize self rating of anxiety at 3 or below and demonstrated decreased signs of anxiety, or be deemed stable for discharge by MD  5/2: Patient rates anxiety at 7.    4. Goal(s): Patient will demonstrate decreased signs of withdrawal due to substance abuse   Met: Yes   Target date: 3-5 days post admission date   As evidenced by: Patient will produce a CIWA/COWS score of 0, have stable vitals signs, and no symptoms of withdrawal   5/3: Goal met. No withdrawal symptoms reported at this time.   5. Goal(s): Patient will demonstrate decreased signs of psychosis  * Met: No  * Target date: 3-5 days post admission date  * As evidenced by: Patient will demonstrate decreased frequency of AVH or return to baseline function   5/2: Goal not met: Pt to take medication as prescribed to decrease psychosis to baseline.  Attendees: Patient:    Family:    Physician: Dr. Parke Poisson 02/04/2016 9:30 AM  Nursing: Honor Loh, Micheline Chapman., RN 02/04/2016 9:30 AM  Clinical Social Worker: Tilden Fossa, LCSW 02/04/2016 9:30 AM  Other: Maxie Better, LCSW  02/04/2016 9:30 AM  Other:  02/04/2016 9:30 AM  Other: Lars Pinks, Case Manager 02/04/2016 9:30 AM  Other: Agustina Caroli; Samuel Jester , NP  02/04/2016 9:30 AM  Other:    Other:             Scribe for Treatment Team:  Tilden Fossa, [place]

## 2016-02-05 LAB — CBC WITH DIFFERENTIAL/PLATELET
BASOS ABS: 0 10*3/uL (ref 0.0–0.1)
BASOS PCT: 0 %
EOS ABS: 0.1 10*3/uL (ref 0.0–0.7)
Eosinophils Relative: 1 %
HEMATOCRIT: 36.1 % (ref 36.0–46.0)
Hemoglobin: 11.9 g/dL — ABNORMAL LOW (ref 12.0–15.0)
Lymphocytes Relative: 14 %
Lymphs Abs: 1.1 10*3/uL (ref 0.7–4.0)
MCH: 27.4 pg (ref 26.0–34.0)
MCHC: 33 g/dL (ref 30.0–36.0)
MCV: 83 fL (ref 78.0–100.0)
MONO ABS: 1 10*3/uL (ref 0.1–1.0)
Monocytes Relative: 14 %
NEUTROS ABS: 5.4 10*3/uL (ref 1.7–7.7)
Neutrophils Relative %: 71 %
PLATELETS: 218 10*3/uL (ref 150–400)
RBC: 4.35 MIL/uL (ref 3.87–5.11)
RDW: 14.6 % (ref 11.5–15.5)
WBC: 7.5 10*3/uL (ref 4.0–10.5)

## 2016-02-05 LAB — BASIC METABOLIC PANEL
ANION GAP: 8 (ref 5–15)
BUN: 13 mg/dL (ref 6–20)
CALCIUM: 9.2 mg/dL (ref 8.9–10.3)
CO2: 25 mmol/L (ref 22–32)
CREATININE: 0.85 mg/dL (ref 0.44–1.00)
Chloride: 105 mmol/L (ref 101–111)
Glucose, Bld: 89 mg/dL (ref 65–99)
Potassium: 4.1 mmol/L (ref 3.5–5.1)
SODIUM: 138 mmol/L (ref 135–145)

## 2016-02-05 LAB — PROLACTIN: PROLACTIN: 32.8 ng/mL — AB (ref 4.8–23.3)

## 2016-02-05 LAB — HEMOGLOBIN A1C
HEMOGLOBIN A1C: 5.6 % (ref 4.8–5.6)
MEAN PLASMA GLUCOSE: 114 mg/dL

## 2016-02-05 MED ORDER — HYDROXYZINE HCL 25 MG PO TABS
25.0000 mg | ORAL_TABLET | Freq: Four times a day (QID) | ORAL | Status: DC | PRN
  Administered 2016-02-05 – 2016-02-06 (×3): 25 mg via ORAL
  Filled 2016-02-05 (×3): qty 1

## 2016-02-05 MED ORDER — QUETIAPINE FUMARATE 50 MG PO TABS
75.0000 mg | ORAL_TABLET | Freq: Every day | ORAL | Status: DC
  Administered 2016-02-05: 75 mg via ORAL
  Filled 2016-02-05 (×3): qty 1

## 2016-02-05 MED ORDER — SALINE SPRAY 0.65 % NA SOLN
1.0000 | NASAL | Status: DC | PRN
  Administered 2016-02-05 – 2016-02-06 (×2): 1 via NASAL
  Filled 2016-02-05: qty 44

## 2016-02-05 NOTE — Progress Notes (Signed)
Patient ID: Caitlin Bell, female   DOB: 08/07/1991, 25 y.o.   MRN: [id number] D: Client visible on unit, seen in dayroom watching TV. Client reports "I haven't felt well all day" depression "7" of 10. Client complains of nasal congestion. No visitors today, but talked to family on phone. A: Writer provided emotional support, called NP with client's complaint, received order for Saline drops.(see MAR). Staff will monitor q2715min for safety. R: Client is safe on the unit, attended group.

## 2016-02-05 NOTE — Progress Notes (Signed)
[facility] Group Notes:  (Nursing/MHT/Case Management/Adjunct)  Date:  02/05/2016  Time:    Type of Therapy:  Nurse Education  Participation Level:  Active  Participation Quality:  Appropriate  Affect:  Depressed  Cognitive:  Alert and Oriented  Insight:  Improving  Engagement in Group:  Developing/Improving  Modes of Intervention:  Discussion, Socialization and Support  Summary of Progress/Problems:The purpose of this group is to develop a goal for today. Pt reports that her goal is to participate in group. She wants to work on Pharmacologistcoping skills for anxiety and depression.   Beatrix ShipperWright, Amier Hoyt Martin 02/05/2016, 4:55 PM

## 2016-02-05 NOTE — [facility] Counselor (Signed)
Adult Comprehensive Assessment  Patient ID: Caitlin Bell, female   DOB: 02/18/1991, 25 y.o.   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 How long has patient lived in current situation?: 1 year, she states, although intake assessment states she was "recently homeless" What is atmosphere in current home: Comfortable, Loving, Supportive  Family History:  Marital status: Single Are you sexually active?: Yes What is your sexual orientation?: Straight Has your sexual activity been affected by drugs, alcohol, medication, or emotional stress?: None Does patient have children?: Yes How many children?: 4 How is patient's relationship with their children?: 2yo, 1yo, and 33mo - relationship is good.  Child Protective Services is involved with her because of her mental health, and they provide support.  Also has a 8yo daughter who lives with pt's mother - reports a good relationship there also.  Childhood History:  By whom was/is the patient raised?: Mother Additional childhood history information: No contact with father Description of patient's relationship with caregiver when they were a child:  Good relationship with mother as a child Patient's description of current relationship with people who raised him/her:  Pt's 8yo daughter lives with mother, so that puts a strain on their relationship.  Pt just got her younger 3 children back from mother about a month ago. How were you disciplined when you got in trouble as a child/adolescent?: Spanked, spanked Does patient have siblings?: Yes Number of Siblings: 1 Description of patient's current relationship with siblings: 1 half-sister, very good relationship Did patient suffer any verbal/emotional/physical/sexual abuse as a child?: No Did patient suffer from severe childhood neglect?: No Has patient ever been sexually abused/assaulted/raped as an adolescent or adult?: No Was the patient ever a victim of a crime or a disaster?: No Witnessed domestic violence?: No Has patient been effected by domestic violence as an adult?: No  Education:  Highest grade of school patient has completed: 12th grade Currently a student?: No Learning disability?: No  Employment/Work Situation:   Employment situation: Unemployed What is the longest time patient has a held a job?: 3 months 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 Has patient ever been in the Eli Lilly and Companymilitary?: No Are There Guns or Other Weapons in Your Home?: No  Financial Resources:   Surveyor, quantityinancial resources: Actoreceives [employer] (TANF), Medicaid, Sales executiveood stamps Does patient have a Lawyerrepresentative payee or guardian?: No  Alcohol/Substance Abuse:   What has been your use of drugs/alcohol within the last 12 months?: Small bottle of vodka daily Alcohol/Substance Abuse Treatment Hx: Denies past history Has alcohol/substance abuse ever caused legal problems?: No  Social Support System:   Forensic psychologistatient's Community Support System: Fair Museum/gallery exhibitions officerDescribe Community Support System: Mom, half-sister Type of faith/religion: None  Leisure/Recreation:   Leisure and Hobbies: Higher education careers adviserWatches TV, reads books  Strengths/Needs:   What things does the patient do well?: Likes to organize In what areas does patient struggle /  problems for patient: Mental health, Auditory/visual hallucinations, depression, anxiety  Discharge Plan:  Does patient have access to transportation?: Yes Will patient be returning to same living situation after discharge?: Yes Currently receiving community mental health services: No ([facility], medication management only) If no, would patient like referral for services when discharged?: Yes (What county?) (Would like to switch to [facility]) Does patient have financial barriers related to discharge medications?: No (Has Medicaid, needs medications to be on Medicaid-approved list)  Summary/Recommendations:   Summary and Recommendations (to be completed by the evaluator): Patient is a 25yo female admitted to the hospital with auditory/visual hallucinations, passive suicidal ideation, daily panic attacks and daily alcohol use and reports primary trigger for admission was increase in symptoms in the last month she regained custody of 3 of her children, feeling like her medications were not working.  Patient will benefit from crisis stabilization, medication evaluation, group therapy and psychoeducation, in addition to case management for discharge planning. At discharge it is recommended that Patient adhere to the established discharge plan and continue in treatment.  Sarina Ser. 02/05/2016

## 2016-02-05 NOTE — [facility] Group Notes (Signed)
[facility]  02/05/2016  8:45 AM  Participation Quality: Did Not Attend. Patient invited to participate but declined.  Samuella BruinKristin Fiza Nation, MSW, LCSW Clinical Social Worker [facility] [phone]

## 2016-02-05 NOTE — Progress Notes (Signed)
D: Pt presents with flat affect and depressed mood. Pt reported feeling dizzy, nauseous and light headed. Pt reported that the Seroquel at bedtime was effective for sleep and VH. Pt denies active VH. Pt denies suicidal thoughts. Pt have minimal interaction on the unit due to feeling tired and sluggish. Vistaril given for anxiety, per pt request. A: medications administered as ordered per MD. Verbal support provided. Pt encouraged to attend groups. 15 minute checks performed for safety.  R: Pt receptive to tx.

## 2016-02-05 NOTE — Progress Notes (Signed)
Adult Psychoeducational Group Note  Date:  02/05/2016 Time:  9:57 PM  Group Topic/Focus:  Wrap-Up Group:   The focus of this group is to help patients review their daily goal of treatment and discuss progress on daily workbooks.  Participation Level:  Active  Participation Quality:  Appropriate  Affect:  Appropriate  Cognitive:  Alert  Insight: Appropriate  Engagement in Group:  Engaged  Modes of Intervention:  Discussion  Additional Comments:  Patient goal for today was to talk with the doctor about a discharge plan. Patient states she is discharging Saturday. On a scale between 1-10, (1=worse, 10=best) patient rated her day a 5.  Destiny Hagin L Zenaida Tesar 02/05/2016, 9:57 PM

## 2016-02-05 NOTE — [facility] Group Notes (Signed)
  Late entry from 02/04/16:         [facility] 02/04/16  1:15 PM   Type of Therapy: Group Therapy  Participation Level: Did Not Attend. Patient invited to participate but declined.   Samuella BruinKristin Aadil Sur, MSW, LCSW Clinical Social Worker [facility] [phone]

## 2016-02-05 NOTE — Progress Notes (Addendum)
Patient ID: Caitlin Bell, female   DOB: [birth year], 25 y.o.   MRN: [id number] [facility] MD Progress Note  02/05/2016 3:45 PM Caitlin Bell  MRN:  [id number] Subjective:  Patient states she is feeling better, and feels she is less depressed. She states she feels medications are helping, and describes improved sleep.  She reports episode of lightheadedness, dizziness this AM, now resolved .  She states she  is hoping for discharge soon.  Objective : I have discussed case with treatment team and have met with patient. Currently patient reports improving mood, and states she feels less depressed./ She states she is happy with current medication regimen, because " I can feel it is helping and the shadows have almost completely gone away ", referring to brief visual hallucinations she had reported . She also states she slept better. As above, reports episode of dizziness this AM, but at this time gait steady and denies any lightheadedness or dizziness  No disruptive behaviors on unit, visible on unit, going to some groups, limited interaction with peers, pleasant on approach. As patient improves she is focusing more on disposition planning and is  wanting to discharge soon. Labs- TSH, Lipid Panel WNL, HgbA1C 5.6  Principal Problem: Bipolar affective disorder, depressed, severe, with psychotic behavior ([place]) Diagnosis:   Patient Active Problem List   Diagnosis Date Noted  . Bipolar affective disorder, depressed, severe, with psychotic behavior ([place]) [F31.5] 02/02/2016  . Normal labor [O80, Z37.9] 10/31/2014  . Bipolar disorder ([place]) [F31.9] 10/31/2014  . Cholestasis of pregnancy [O26.619, K83.1] 10/31/2014  . H/O anorexia nervosa [Z86.59] 10/31/2014  . H/O bulimia nervosa [Z86.59] 10/31/2014  . Gestational diabetes [O24.419] 10/31/2014  . Depression--on Latuda [F32.9] 10/31/2014  . Umbilical hernia [I71.2] 10/31/2014  . Hx of preterm delivery  [O09.219] 10/31/2014  . Preterm delivery  [O60.10X0] 10/31/2014  . Generalized anxiety disorder [F41.1] 10/31/2014  . History of marijuana use [Z87.898] 10/31/2014  . Cerebral palsy ([employer]) [G80.9] 10/31/2014  . [redacted] weeks gestation of pregnancy [Z3A.35]   . Hx of PTL (preterm labor), current pregnancy [O09.219] 10/30/2014  . Preterm labor [O60.10X0] 10/30/2014  . Back pain complicating pregnancy in third trimester [O26.893, M54.9] 10/14/2014   Total Time spent with patient: 20 minutes    Past Medical History:  Past Medical History  Diagnosis Date  . Cerebral palsy (HCC)     mild, speech impediment  . Anxiety   . Gestational diabetes   . Depression     Past Surgical History  Procedure Laterality Date  . Cholecystectomy  2014  . Hernia repair     Family History: History reviewed. No pertinent family history.  Social History:  History  Alcohol Use  . Yes    Comment: ocasionally, not with the pregnancy     History  Drug Use No    Comment: hx of marijuana use 3 weeks ago    Social History   Social History  . Marital Status: Single    Spouse Name: N/A  . Number of Children: N/A  . Years of Education: N/A   Social History Main Topics  . Smoking status: Former Smoker    Quit date: 10/31/2011  . Smokeless tobacco: Never Used  . Alcohol Use: Yes     Comment: ocasionally, not with the pregnancy  . Drug Use: No     Comment: hx of marijuana use 3 weeks ago  . Sexual Activity: Yes    Birth Control/ Protection: None   Other Topics Concern  .  None   Social History Narrative   Additional Social History:    Pain Medications: pt denies abuse - see PTA meds list Prescriptions: pt denies abuse - see PTA meds list Over the Counter: pt denies abuse - see PTA meds list History of alcohol / drug use?: Yes Name of Substance 1: alcohol 1 - Age of First Use: teenager 1 - Amount (size/oz): one small bottle vodka  1 - Frequency: daily 1 - Duration: month  Sleep: Good  Appetite:  Good  Current  Medications: Current Facility-Administered Medications  Medication Dose Route Frequency Provider Last Rate Last Dose  . acetaminophen (TYLENOL) tablet 650 mg  650 mg Oral Q6H PRN Patrecia Pour, NP   650 mg at 02/04/16 2201  . alum & mag hydroxide-simeth (MAALOX/MYLANTA) 200-200-20 MG/5ML suspension 30 mL  30 mL Oral Q4H PRN Patrecia Pour, NP      . hydrOXYzine (ATARAX/VISTARIL) tablet 25 mg  25 mg Oral Q6H PRN Jenne Campus, MD   25 mg at 02/05/16 1256  . ibuprofen (ADVIL,MOTRIN) tablet 600 mg  600 mg Oral Q8H PRN Patrecia Pour, NP      . magnesium hydroxide (MILK OF MAGNESIA) suspension 30 mL  30 mL Oral Daily PRN Patrecia Pour, NP      . nicotine (NICODERM CQ - dosed in mg/24 hours) patch 21 mg  21 mg Transdermal Daily Patrecia Pour, NP   21 mg at 02/03/16 0816  . ondansetron (ZOFRAN-ODT) disintegrating tablet 4 mg  4 mg Oral Q8H PRN Jenne Campus, MD   4 mg at 02/05/16 1130  . QUEtiapine (SEROQUEL) tablet 75 mg  75 mg Oral QHS Jenne Campus, MD        Lab Results:  Results for orders placed or performed during the hospital encounter of 02/02/16 (from the past 48 hour(s))  Hemoglobin A1c     Status: None   Collection Time: 02/04/16  6:11 AM  Result Value Ref Range   Hgb A1c MFr Bld 5.6 4.8 - 5.6 %    Comment: (NOTE)         Pre-diabetes: 5.7 - 6.4         Diabetes: >6.4         Glycemic control for adults with diabetes: <7.0    Mean Plasma Glucose 114 mg/dL    Comment: (NOTE) Performed At: [facility] [place], [place] [id number] Lindon Romp MD [id number] Performed at [facility]   Lipid panel     Status: None   Collection Time: 02/04/16  6:11 AM  Result Value Ref Range   Cholesterol 139 0 - 200 mg/dL   Triglycerides 86 <150 mg/dL   HDL 53 >40 mg/dL   Total CHOL/HDL Ratio 2.6 RATIO   VLDL 17 0 - 40 mg/dL   LDL Cholesterol 69 0 - 99 mg/dL    Comment:        Total Cholesterol/HDL:CHD Risk Coronary Heart Disease  Risk Table                     Men   Women  1/2 Average Risk   3.4   3.3  Average Risk       5.0   4.4  2 X Average Risk   9.6   7.1  3 X Average Risk  23.4   11.0        Use the calculated Patient Ratio above and the  CHD Risk Table to determine the patient's CHD Risk.        ATP III CLASSIFICATION (LDL):  <100     mg/dL   Optimal  100-129  mg/dL   Near or Above                    Optimal  130-159  mg/dL   Borderline  160-189  mg/dL   High  >190     mg/dL   Very High Performed at [facility]   TSH     Status: None   Collection Time: 02/04/16  6:11 AM  Result Value Ref Range   TSH 2.524 0.350 - 4.500 uIU/mL    Comment: Performed at [facility]  Prolactin     Status: Abnormal   Collection Time: 02/04/16  6:11 AM  Result Value Ref Range   Prolactin 32.8 (H) 4.8 - 23.3 ng/mL    Comment: (NOTE) Performed At: [facility] [place], [place] [id number] Lindon Romp MD [id number] Performed at [facility]     Blood Alcohol level:  Lab Results  Component Value Date   [facility] <5 02/02/2016    Physical Findings: AIMS: Facial and Oral Movements Muscles of Facial Expression: None, normal Lips and Perioral Area: None, normal Jaw: None, normal Tongue: None, normal,Extremity Movements Upper (arms, wrists, hands, fingers): None, normal Lower (legs, knees, ankles, toes): None, normal, Trunk Movements Neck, shoulders, hips: None, normal, Overall Severity Severity of abnormal movements (highest score from questions above): None, normal Incapacitation due to abnormal movements: None, normal Patient's awareness of abnormal movements (rate only patient's report): No Awareness, Dental Status Current problems with teeth and/or dentures?: No Does patient usually wear dentures?: No  CIWA:    COWS:     Musculoskeletal: Strength & Muscle Tone: within normal limits Gait & Station: normal Patient leans:  N/A  Psychiatric Specialty Exam: ROS no headache, no vomiting , no rash - episode of lightheadedness this AM  Blood pressure 110/70, pulse 78, temperature 98 F (36.7 C), temperature source Oral, resp. rate 16, height 5' 3.5" (1.613 m), weight 119 lb (53.978 kg), last menstrual period 01/14/2016, not currently breastfeeding.Body mass index is 20.75 kg/(m^2).  No orthostatic changes   General Appearance: Fairly Groomed  Engineer, water::  Good  Speech:  Normal Rate  Volume:  improved   Mood:  Reports she is feeling better, and presents with fuller range of affect   Affect:  more reactive, smiles at times appropriately   Thought Process:  Linear  Orientation:  Other:  fully alert and attentive   Thought Content:  Reports improvement /resolution of visual hallucinations, no delusions expressed, does not appear internally preoccupied   Suicidal Thoughts:  No at this time denies any self injurious or suicidal ideations , plan or intention   Homicidal Thoughts:  No  Memory:  recent and remote grossly intact   Judgement:  Improving   Insight:   Improving   Psychomotor Activity:  Normal  Concentration:  Good  Recall:  Good  Fund of Knowledge:Good  Language: Good  Akathisia:  Negative  Handed:  Right  AIMS (if indicated):     Assets:  Desire for Improvement Resilience  ADL's:  Intact  Cognition: WNL  Sleep:  Number of Hours: 6.25  Assessment - patient reports feeling better, states her mood is improved , and states reported visual hallucinations , which had been occuring at night time , have improved significantly.  She feels Seroquel is helping. She did report feeling dizzy this AM ( now better- BP/Pulse stable ), and this could be side effect of Seroquel , which is Bell medication for patient. We discussed, she wants to continue trial, but agrees to lower dose to minimize potential side effects.  Patient states that Ativan PRNs are helping for anxiety, but states she would rather try Vistaril,  because would prefer not to be on any addictive medication after discharge Treatment Plan Summary: Daily contact with patient to assess and evaluate symptoms and progress in treatment, Medication management, Plan inpatient treatment  and medications as below  Encourage group, milieu attendance, to work on coping skills and symptom reduction D/C  Ativan PRNs  Start Vistaril 25 mgrs Q 6 hours PRN for anxiety as needed   Decrease Seroquel  To 75  mgrs QHS for mood disorder, psychotic symptoms Treatment team working on disposition planning options   Neita Garnet, MD 02/05/2016, 3:45 PM

## 2016-02-06 MED ORDER — HYDROXYZINE HCL 25 MG PO TABS: 25.0000 mg | ORAL_TABLET | Freq: Four times a day (QID) | ORAL | Status: DC | PRN

## 2016-02-06 MED ORDER — QUETIAPINE FUMARATE 25 MG PO TABS: 75.0000 mg | ORAL_TABLET | Freq: Every day | ORAL | Status: DC

## 2016-02-06 NOTE — [facility] Suicide Risk Assessment (Signed)
[facility] INPATIENT:  Family/Significant Other Suicide Prevention Education  Suicide Prevention Education:  Education Completed; mother Majel Homeratty Ness [phone],  (name of family member/significant other) has been identified by the patient as the family member/significant other with whom the patient will be residing, and identified as the person(s) who will aid the patient in the event of a mental health crisis (suicidal ideations/suicide attempt).  With written consent from the patient, the family member/significant other has been provided the following suicide prevention education, prior to the and/or following the discharge of the patient.  The suicide prevention education provided includes the following:  Suicide risk factors  Suicide prevention and interventions  National Suicide Hotline telephone number  [facility] assessment telephone number  [facility] Assistance 911  [facility] and/or Residential Mobile Crisis Unit telephone number  Request made of family/significant other to:  Remove weapons (e.g., guns, rifles, knives), all items previously/currently identified as safety concern.    Remove drugs/medications (over-the-counter, prescriptions, illicit drugs), all items previously/currently identified as a safety concern.  The family member/significant other verbalizes understanding of the suicide prevention education information provided.  The family member/significant other agrees to remove the items of safety concern listed above.  Lacheryl Niesen, [place] CarboKristin L 02/06/2016, 12:15 PM

## 2016-02-06 NOTE — [facility] Suicide Risk Assessment (Addendum)
[facility]   Principal Problem: Bipolar affective disorder, depressed, severe, with psychotic behavior [facility](HCC) Discharge Diagnoses:  Patient Active Problem List   Diagnosis Date Noted  . Bipolar affective disorder, depressed, severe, with psychotic behavior (HCC) [F31.5] 02/02/2016  . Normal labor [O80, Z37.9] 10/31/2014  . Bipolar disorder (HCC) [F31.9] 10/31/2014  . Cholestasis of pregnancy [O26.619, K83.1] 10/31/2014  . H/O anorexia nervosa [Z86.59] 10/31/2014  . H/O bulimia nervosa [Z86.59] 10/31/2014  . Gestational diabetes [O24.419] 10/31/2014  . Depression--on Latuda [F32.9] 10/31/2014  . Umbilical hernia [K42.9] 10/31/2014  . Hx of preterm delivery  [O09.219] 10/31/2014  . Preterm delivery [O60.10X0] 10/31/2014  . Generalized anxiety disorder [F41.1] 10/31/2014  . History of marijuana use [Z87.898] 10/31/2014  . Cerebral palsy (HCC) [G80.9] 10/31/2014  . [redacted] weeks gestation of pregnancy [Z3A.35]   . Hx of PTL (preterm labor), current pregnancy [O09.219] 10/30/2014  . Preterm labor [O60.10X0] 10/30/2014  . Back pain complicating pregnancy in third trimester [O26.893, M54.9] 10/14/2014    Total Time spent with patient: 30 minutes  Musculoskeletal: Strength & Muscle Tone: within normal limits Gait & Station: normal Patient leans: N/A  Psychiatric Specialty Exam: ROS denies headache, denies chest pain, denies vomiting , reports some rhinorrhea, which she feels is related to seasonal allergies   Blood pressure 91/54, pulse 115, temperature 98.6 F (37 C), temperature source Oral, resp. rate 16, height 5' 3.5" (1.613 m), weight 119 lb (53.978 kg), last menstrual period 01/14/2016, not currently breastfeeding.Body mass index is 20.75 kg/(m^2).  General Appearance: improved grooming   Eye Contact::  Good  Speech:  Normal Rate409  Volume:  Normal  Mood:  reports she is feeling better, and currently denies feeling depressed or sad  Affect:  Appropriate  and more reactive   Thought Process:  Linear  Orientation:  Full (Time, Place, and Person)  Thought Content:  denies hallucinations, no delusions, not internally preoccupied   Suicidal Thoughts:  No denies any suicidal or self injurious ideations, denies any homicidal or violent ideations   Homicidal Thoughts:  No  Memory:  recent and remote grossly intact   Judgement:  Other:  improving   Insight:  improving   Psychomotor Activity:  Normal  Concentration:  Good  Recall:  Good  Fund of Knowledge:Good  Language: Good  Akathisia:  Negative  Handed:  Right  AIMS (if indicated):   no abnormal involuntary movements noted or reported, no akathisa  Assets:  Communication Skills Desire for Improvement Resilience  Sleep:  Number of Hours: 5.75  Cognition: WNL  ADL's:  Intact   Mental Status Per Nursing Assessment::   On Admission:  Suicidal ideation indicated by patient, Self-harm thoughts  Demographic Factors:  25 year old female, lives alone , has three  Children, who are currently with the children's grandmother  Loss Factors: Recent CPS investigation, unemployment, financial difficulties  Historical Factors: Prior psychiatric admissions, most recently two years ago, has been diagnosed with Bipolar Disorder, history of self cutting in the past, but denies actual suicide attempts   Risk Reduction Factors:   Responsible for children under 25 years of age, Sense of responsibility to family and Positive coping skills or problem solving skills  Continued Clinical Symptoms:  At this time patient presents alert, attentive,well related, pleasant, calm, describes mood as improved and denies feeling depressed at this time, affect is more reactive, no thought disorder, no suicidal ideations, no homicidal ideations, no psychotic symptoms, future oriented  At this time denies medication side  effects- did not feel dizzy or light headed this AM. Gait steady . Medication side effects have been  reviewed .  Cognitive Features That Contribute To Risk:  No gross cognitive deficits noted upon discharge. Is alert , attentive, and oriented x 3   Suicide Risk:  Mild:  Suicidal ideation of limited frequency, intensity, duration, and specificity.  There are no identifiable plans, no associated intent, mild dysphoria and related symptoms, good self-control (both objective and subjective assessment), few other risk factors, and identifiable protective factors, including available and accessible social support.  Follow-up Information    Follow up with [facility]  On 02/10/2016.   Why:  On Tuesday May 9th, walk-in clinic open between 12-3pm for assessment for therapy and medication management services. Please arrive early in order to be seen as quickly as possible. Bring your Medicaid card, ID, and social security card.    Contact information:   [address] [place], [place] [address] Phone: [phone]      Plan Of Care/Follow-up recommendations:  Activity:  as tolerated  Diet:  regular Tests:  NA Other:  see below  Patient requesting discharge and no current ground for involuntary commitment  Patient is leaving unit in good spirits  Plans to return home Follow up as above . Nehemiah Massed, MD 02/06/2016, 12:42 PM

## 2016-02-06 NOTE — Progress Notes (Signed)
Recreation Therapy Notes  Date: 05.05.2017 Time: 9:30am Location: [address] Dayroom   Group Topic: Stress Management  Goal Area(s) Addresses:  Patient will actively participate in stress management techniques presented during session.   Behavioral Response: Appropriate   Intervention: Stress management techniques  Activity :  Guided Imagery. Patient listened to audio recording of guided imagery script for stress relief and increased self-esteem.   Education:  Stress Management, Discharge Planning.   Education Outcome: Acknowledges education  Clinical Observations/Feedback: Patient was observed to engage in technique introduced, at approximately 9:40am patient exited group without explanation and did not return.   Marykay Lexenise L Roneisha Stern, LRT/CTRS         Giankarlo Leamer L 02/06/2016 10:06 AM

## 2016-02-06 NOTE — Discharge Summary (Signed)
Physician Discharge Summary Note  Patient:  Margrett RudVictoria L Foti is an 25 y.o., female MRN:  [id number] DOB:  12/19/1990 Patient phone:  [phone] (home)  Patient address:   [address] [place] [address],  Total Time spent with patient: 30 minutes  Date of Admission:  02/02/2016 Date of Discharge: 02/06/2016  Reason for Admission:    Principal Problem: Bipolar affective disorder, depressed, severe, with psychotic behavior [facility](HCC) Discharge Diagnoses: Patient Active Problem List   Diagnosis Date Noted  . Bipolar affective disorder, depressed, severe, with psychotic behavior (HCC) [F31.5] 02/02/2016  . Normal labor [O80, Z37.9] 10/31/2014  . Bipolar disorder (HCC) [F31.9] 10/31/2014  . Cholestasis of pregnancy [O26.619, K83.1] 10/31/2014  . H/O anorexia nervosa [Z86.59] 10/31/2014  . H/O bulimia nervosa [Z86.59] 10/31/2014  . Gestational diabetes [O24.419] 10/31/2014  . Depression--on Latuda [F32.9] 10/31/2014  . Umbilical hernia [K42.9] 10/31/2014  . Hx of preterm delivery  [O09.219] 10/31/2014  . Preterm delivery [O60.10X0] 10/31/2014  . Generalized anxiety disorder [F41.1] 10/31/2014  . History of marijuana use [Z87.898] 10/31/2014  . Cerebral palsy (HCC) [G80.9] 10/31/2014  . [redacted] weeks gestation of pregnancy [Z3A.35]   . Hx of PTL (preterm labor), current pregnancy [O09.219] 10/30/2014  . Preterm labor [O60.10X0] 10/30/2014  . Back pain complicating pregnancy in third trimester [O26.893, M54.9] 10/14/2014   Past Psychiatric History:  Denied  Past Medical History:  Past Medical History  Diagnosis Date  . Cerebral palsy (HCC)     mild, speech impediment  . Anxiety   . Gestational diabetes   . Depression     Past Surgical History  Procedure Laterality Date  . Cholecystectomy  2014  . Hernia repair     Family History: History reviewed. No pertinent family history. Family Psychiatric  History:  Denied Social History:  History  Alcohol Use  . Yes     Comment: ocasionally, not with the pregnancy     History  Drug Use No    Comment: hx of marijuana use 3 weeks ago    Social History   Social History  . Marital Status: Single    Spouse Name: N/A  . Number of Children: N/A  . Years of Education: N/A   Social History Main Topics  . Smoking status: Former Smoker    Quit date: 10/31/2011  . Smokeless tobacco: Never Used  . Alcohol Use: Yes     Comment: ocasionally, not with the pregnancy  . Drug Use: No     Comment: hx of marijuana use 3 weeks ago  . Sexual Activity: Yes    Birth Control/ Protection: None   Other Topics Concern  . None   Social History Narrative    Hospital Course:  Zeb ComfortVictoria Cassada, 25 year old female stated she has had recent brief hallucinations, where she saw " shadows going by".  She stated these are recent, and states she feels they are related to anxiety, depression . States she has been depressed over the last few weeks because she had a CPS case open and her children had been taken because of her "mental condition."    Margrett RudVictoria L Beals was admitted for Bipolar affective disorder, depressed, severe, with psychotic behavior (HCC) and crisis management.  She was treated with medications listed below.  Medical problems were identified and treated as needed.  Home medications were restarted as appropriate.  Improvement was monitored by observation and Margrett RudVictoria L Hodge daily report of symptom reduction.  Emotional and mental status was monitored  by daily self inventory reports completed by [name] and clinical staff.  Patient reported continued improvement, denied any new concerns.  Patient had been compliant on medications and denied side effects.  Support and encouragement was provided.    Patient did well during inpatient stay.  At time of discharge, patient rated both depression and anxiety levels to be manageable and minimal.  Patient was able to identify the triggers of emotional crises and  de-stabilizations.  Patient identified the positive things in life that would help in dealing with feelings of loss, depression and unhealthy or abusive tendencies.         [name] was evaluated by the treatment team for stability and plans for continued recovery upon discharge.  She was offered further treatment options upon discharge including Residential, Intensive Outpatient and Outpatient treatment.  She will follow up with agencies listed  for medication management and counseling.  Encouraged patient to maintain satisfactory support network and home environment.  Advised to adhere to medication compliance and outpatient treatment follow up.      [name] motivation was an integral factor for scheduling further treatment.  Employment, transportation, bed availability, health status, family support, and any pending legal issues were also considered during her hospital stay.  Upon completion of this admission the patient was both mentally and medically stable for discharge denying suicidal/homicidal ideation, auditory/visual/tactile hallucinations, delusional thoughts and paranoia.      Physical Findings: AIMS: Facial and Oral Movements Muscles of Facial Expression: None, normal Lips and Perioral Area: None, normal Jaw: None, normal Tongue: None, normal,Extremity Movements Upper (arms, wrists, hands, fingers): None, normal Lower (legs, knees, ankles, toes): None, normal, Trunk Movements Neck, shoulders, hips: None, normal, Overall Severity Severity of abnormal movements (highest score from questions above): None, normal Incapacitation due to abnormal movements: None, normal Patient's awareness of abnormal movements (rate only patient's report): No Awareness, Dental Status Current problems with teeth and/or dentures?: No Does patient usually wear dentures?: No  CIWA:    COWS:     Musculoskeletal: Strength & Muscle Tone: within normal limits Gait & Station:  normal Patient leans: N/A  Psychiatric Specialty Exam: Review of Systems  Psychiatric/Behavioral: Negative for suicidal ideas and hallucinations. Depression: improving.    Blood pressure 91/54, pulse 115, temperature 98.6 F (37 C), temperature source Oral, resp. rate 16, height 5' 3.5" (1.613 m), weight 53.978 kg (119 lb), last menstrual period 01/14/2016, not currently breastfeeding.Body mass index is 20.75 kg/(m^2).  Have you used any form of tobacco in the last 30 days? (Cigarettes, Smokeless Tobacco, Cigars, and/or Pipes): Yes  Has this patient used any form of tobacco in the last 30 days? (Cigarettes, Smokeless Tobacco, Cigars, and/or Pipes) Yes, N/A  Blood Alcohol level:  Lab Results  Component Value Date   ETH <5 02/02/2016    Metabolic Disorder Labs:  Lab Results  Component Value Date   HGBA1C 5.6 02/04/2016   MPG 114 02/04/2016   Lab Results  Component Value Date   PROLACTIN 32.8* 02/04/2016   Lab Results  Component Value Date   CHOL 139 02/04/2016   TRIG 86 02/04/2016   HDL 53 02/04/2016   CHOLHDL 2.6 02/04/2016   VLDL 17 02/04/2016   LDLCALC 69 02/04/2016    See Psychiatric Specialty Exam and Suicide Risk Assessment completed by Attending Physician prior to discharge.  Discharge destination:  Home  Is patient on multiple antipsychotic therapies at discharge:  No   Has Patient had three or more  failed trials of antipsychotic monotherapy by history:  No  Recommended Plan for Multiple Antipsychotic Therapies: NA     Medication List    STOP taking these medications        ARIPiprazole 2 MG tablet  Commonly known as:  ABILIFY      TAKE these medications      Indication   hydrOXYzine 25 MG tablet  Commonly known as:  ATARAX/VISTARIL  Take 1 tablet (25 mg total) by mouth every 6 (six) hours as needed for anxiety.   Indication:  Anxiety Neurosis     QUEtiapine 25 MG tablet  Commonly known as:  SEROQUEL  Take 3 tablets (75 mg total) by mouth at  bedtime.   Indication:  mood stabilization           Follow-up Information    Follow up with [facility]  On 02/10/2016.   Why:  On Tuesday May 9th, walk-in clinic open between 12-3pm for assessment for therapy and medication management services. Please arrive early in order to be seen as quickly as possible. Bring your Medicaid card, ID, and social security card.    Contact information:   [address] [place], [place] [address] Phone: [phone]      Follow-up recommendations:  Activity:  as tol Diet:  as tol  Comments:  1.  Take all your medications as prescribed.   2.  Report any adverse side effects to outpatient provider. 3.  Patient instructed to not use alcohol or illegal drugs while on prescription medicines. 4.  In the event of worsening symptoms, instructed patient to call 911, the crisis hotline or go to nearest emergency room for evaluation of symptoms.  Signed: Lindwood Qua, NP [facility] 02/06/2016, 3:14 PM  Patient seen, Suicide Assessment Completed.  Disposition Plan Reviewed

## 2016-02-06 NOTE — Progress Notes (Signed)
  [facility] Discharge [facility] :  Will you be returning to the same living situation after discharge:  Yes,  patient plans to return home At discharge, do you have transportation home?: Yes,  taxi voucher or friends/family Do you have the ability to pay for your medications: Yes,  patient will be provided with prescriptions at discharge  Release of information consent forms completed and in the chart;  Patient's signature needed at discharge.  Patient to Follow up at: Follow-up Information    [facility]  On 02/10/2016.   Why:  On Tuesday May 9th, walk-in clinic open between 12-3pm for assessment for therapy and medication management services. Please arrive early in order to be seen as quickly as possible. Bring your Medicaid card, ID, and social security card.    Contact information:   [address] [place], [place] [address] Phone: [phone]      Next level of care provider has access to [facility] Link:no  Safety Planning and Suicide Prevention discussed: Yes,  with patient and mother  Have you used any form of tobacco in the last 30 days? (Cigarettes, Smokeless Tobacco, Cigars, and/or Pipes): Yes  Has patient been referred to the Quitline?: Patient refused referral  Patient has been referred for addiction treatment: Yes  Merl Guardino, West CarboKristin L 02/06/2016, 12:16 PM

## 2016-02-06 NOTE — BHH Group Notes (Signed)
   [facility] Note  02/06/2016  8:45 AM   Participation Quality: Alert, Appropriate and Oriented  Mood/Affect: Blunted but improving  Depression Rating: 0  Anxiety Rating: 4  Thoughts of Suicide: Pt denies SI/HI  Will you contract for safety? Yes  Current AVH: Pt denies  Plan for Discharge/Comments: Pt attended discharge planning group and actively participated in group. CSW provided pt with today's workbook. Patient plans to return home to follow up with outpatient services.  Transportation Means: Pt reports access to transportation  Supports: No supports mentioned at this time  Samuella BruinKristin Lyniah Fujita, MSW, Johnson & JohnsonLCSW Clinical Social Worker [facility] [phone]

## 2016-02-29 ENCOUNTER — Encounter (HOSPITAL_COMMUNITY): Payer: Self-pay | Admitting: Emergency Medicine

## 2016-02-29 ENCOUNTER — Emergency Department (HOSPITAL_COMMUNITY)
Admission: EM | Admit: 2016-02-29 | Discharge: 2016-03-01 | Disposition: A | Discharge status: Other Acute Inpatient Hospital | Payer: Medicaid Other | Attending: Emergency Medicine | Admitting: Emergency Medicine

## 2016-02-29 DIAGNOSIS — G809 Cerebral palsy, unspecified: Secondary | ICD-10-CM | POA: Diagnosis not present

## 2016-02-29 DIAGNOSIS — Z87891 Personal history of nicotine dependence: Secondary | ICD-10-CM | POA: Insufficient documentation

## 2016-02-29 DIAGNOSIS — R45851 Suicidal ideations: Secondary | ICD-10-CM

## 2016-02-29 DIAGNOSIS — F329 Major depressive disorder, single episode, unspecified: Secondary | ICD-10-CM | POA: Insufficient documentation

## 2016-02-29 DIAGNOSIS — F314 Bipolar disorder, current episode depressed, severe, without psychotic features: Secondary | ICD-10-CM | POA: Diagnosis not present

## 2016-02-29 DIAGNOSIS — F3130 Bipolar disorder, current episode depressed, mild or moderate severity, unspecified: Secondary | ICD-10-CM | POA: Diagnosis present

## 2016-02-29 LAB — CBC WITH DIFFERENTIAL/PLATELET
BASOS ABS: 0 10*3/uL (ref 0.0–0.1)
BASOS PCT: 0 %
Eosinophils Absolute: 0.3 10*3/uL (ref 0.0–0.7)
Eosinophils Relative: 5 %
HEMATOCRIT: 37.6 % (ref 36.0–46.0)
HEMOGLOBIN: 12.7 g/dL (ref 12.0–15.0)
Lymphocytes Relative: 30 %
Lymphs Abs: 2 10*3/uL (ref 0.7–4.0)
MCH: 28.7 pg (ref 26.0–34.0)
MCHC: 33.8 g/dL (ref 30.0–36.0)
MCV: 85.1 fL (ref 78.0–100.0)
MONOS PCT: 9 %
Monocytes Absolute: 0.6 10*3/uL (ref 0.1–1.0)
NEUTROS ABS: 3.7 10*3/uL (ref 1.7–7.7)
NEUTROS PCT: 56 %
Platelets: 278 10*3/uL (ref 150–400)
RBC: 4.42 MIL/uL (ref 3.87–5.11)
RDW: 14.5 % (ref 11.5–15.5)
WBC: 6.7 10*3/uL (ref 4.0–10.5)

## 2016-02-29 LAB — COMPREHENSIVE METABOLIC PANEL
ALK PHOS: 102 U/L (ref 38–126)
ALT: 31 U/L (ref 14–54)
ANION GAP: 7 (ref 5–15)
AST: 40 U/L (ref 15–41)
Albumin: 4.6 g/dL (ref 3.5–5.0)
BUN: 12 mg/dL (ref 6–20)
CALCIUM: 9.2 mg/dL (ref 8.9–10.3)
CO2: 24 mmol/L (ref 22–32)
Chloride: 110 mmol/L (ref 101–111)
Creatinine, Ser: 0.87 mg/dL (ref 0.44–1.00)
GFR calc non Af Amer: >60 mL/min (ref >60)
Glucose, Bld: 83 mg/dL (ref 65–99)
POTASSIUM: 4.8 mmol/L (ref 3.5–5.1)
SODIUM: 141 mmol/L (ref 135–145)
TOTAL PROTEIN: 7.7 g/dL (ref 6.5–8.1)
Total Bilirubin: 1.3 mg/dL — ABNORMAL HIGH (ref 0.3–1.2)

## 2016-02-29 LAB — RAPID URINE DRUG SCREEN, HOSP PERFORMED
AMPHETAMINES: NOT DETECTED
Barbiturates: NOT DETECTED
Benzodiazepines: NOT DETECTED
COCAINE: NOT DETECTED
OPIATES: NOT DETECTED
TETRAHYDROCANNABINOL: NOT DETECTED

## 2016-02-29 LAB — ACETAMINOPHEN LEVEL: Acetaminophen (Tylenol), Serum: <10 ug/mL — ABNORMAL LOW (ref 10–30)

## 2016-02-29 LAB — ETHANOL: Alcohol, Ethyl (B): <5 mg/dL (ref <5)

## 2016-02-29 LAB — POC URINE PREG, ED: Preg Test, Ur: NEGATIVE

## 2016-02-29 MED ORDER — ILOPERIDONE 1 MG PO TABS: 1.0000 mg | ORAL_TABLET | Freq: Two times a day (BID) | ORAL | Status: DC

## 2016-02-29 MED ORDER — ILOPERIDONE 1 MG PO TABS: 2.0000 mg | ORAL_TABLET | Freq: Two times a day (BID) | ORAL | Status: DC

## 2016-02-29 MED ORDER — HYDROXYZINE HCL 25 MG PO TABS: 25.0000 mg | ORAL_TABLET | Freq: Four times a day (QID) | ORAL | Status: DC | PRN

## 2016-02-29 MED ORDER — ILOPERIDONE 1 MG PO TABS: 2.0000 mg | ORAL_TABLET | Freq: Every day | ORAL | Status: DC

## 2016-02-29 MED ORDER — QUETIAPINE FUMARATE 50 MG PO TABS
75.0000 mg | ORAL_TABLET | Freq: Every day | ORAL | Status: DC
  Administered 2016-02-29: 75 mg via ORAL
  Filled 2016-02-29: qty 1

## 2016-02-29 MED ORDER — ILOPERIDONE 1 MG PO TABS: 4.0000 mg | ORAL_TABLET | Freq: Every day | ORAL | Status: DC

## 2016-02-29 MED ORDER — CLONAZEPAM 0.5 MG PO TABS
0.2500 mg | ORAL_TABLET | Freq: Every evening | ORAL | Status: DC | PRN
  Administered 2016-02-29: 0.25 mg via ORAL
  Filled 2016-02-29: qty 1

## 2016-02-29 NOTE — ED Notes (Signed)
Per pt, states she is suicidal because her children got taken away

## 2016-02-29 NOTE — ED Notes (Signed)
Patient noted sleeping in room. No complaints, stable, in no acute distress. Q15 minute rounds and monitoring via Security Cameras to continue.  

## 2016-02-29 NOTE — BH Assessment (Signed)
Assessment Note  Caitlin Bell is an 25 y.o. female. Patient was brought into the ED by a friend because of suicidal thoughts with plan to overdose.  Patient reports the contributing factor to current symptoms of depress is the fact her children are now under custody of her mother.  She reports does not allow her to visit and she is not aware of the stipulations in order to regain custody.  This CPS case has been ongoing for the past year because of unstable mental health symptoms per the patient's report.  Per a previous note: PT reports she had CPS worker drive her to [facility]. Pt says their is a CPS report open b/c she was Recently homeless with her 3 kids (2 yr, 58yr, 5 mo). Pt says her mom had been keeping her kids but pt recently got her kids back one month ago.  Patient reports drinking a couple wine coolers a couple times a week and last smoking marijuana on 5/18.  Patient reports the longest period of sobriety was a couple months and almost 1 years recently. Per a previous not the patient reported a different consumption,  She reports she was inpatient at [facility] in 2010 for bipolar d/o. She reports she drinks approx one small bottle of vodka daily. She denies substance abuse. Patient reports she goes to [facility] for med management and therapy. She says she doesn't think her psych medication are working. She reports not taking her current medication because the regiment is to difficult as it it titrated up weekly.  She reports the regiment left her confused.    This Clinical research associate consulted with Caitlin Nottingham, NP it is recommended to refer for inpatient hospitalization for safety.    Diagnosis: Bipolar Disorder, depressed  Past Medical History:  Past Medical History  Diagnosis Date  . Cerebral palsy (HCC)     mild, speech impediment  . Anxiety   . Gestational diabetes   . Depression     Past Surgical History  Procedure Laterality Date  . Cholecystectomy  2014  . Hernia repair      Family  History: No family history on file.  Social History:  reports that she quit smoking about 4 years ago. She has never used smokeless tobacco. She reports that she drinks alcohol. She reports that she does not use illicit drugs.  Additional Social History:  Alcohol / Drug Use Pain Medications: see chart Prescriptions: see chart Over the Counter: see chart History of alcohol / drug use?: Yes Longest period of sobriety (when/how long): couple months Negative Consequences of Use: Personal relationships, Legal Withdrawal Symptoms:  (pt denies withdrawal ) Substance #1 Name of Substance 1: Alcohol 1 - Amount (size/oz): varies 1 - Frequency: couple times a week 1 - Duration: ongoing 1 - Last Use / Amount: 5/27 Substance #2 Name of Substance 2: Marijuana 2 - Amount (size/oz): varies 2 - Frequency: not since 5/18 2 - Duration: ongoing 2 - Last Use / Amount: 5/18  CIWA: CIWA-Ar BP: 115/83 mmHg Pulse Rate: 106 Nausea and Vomiting:  (denies ) Tactile Disturbances:  (denies ) Tremor:  (denies ) Auditory Disturbances:  (denies ) Paroxysmal Sweats:  (denies ) Visual Disturbances:  (denies ) Anxiety:  (denies ) Headache, Fullness in Head:  (denies ) Agitation:  (denies ) Orientation and Clouding of Sensorium:  (denies ) COWS:    Allergies: No Known Allergies  Home Medications:  (Not in a hospital admission)  OB/GYN Status:  Patient's last menstrual period was 02/29/2016.  General Assessment Data Location of Assessment: [facility] TTS Assessment: In system Is this a Tele or Face-to-Face Assessment?: Face-to-Face Is this an Initial Assessment or a Re-assessment for this encounter?: Initial Assessment Marital status: Single Is patient pregnant?: No Pregnancy Status: No Living Arrangements: Alone Can pt return to current living arrangement?: Yes Admission Status: Voluntary Is patient capable of signing voluntary admission?: Yes Referral Source: Self/Family/Friend Insurance type:   (MCD)  Medical Screening Exam [facility] Walk-in ONLY) Medical Exam completed: Yes  Crisis Care Plan Living Arrangements: Alone Name of Psychiatrist: at [facility] Name of Therapist: [facility]  Education Status Is patient currently in school?: No Highest grade of school patient has completed: 12  Risk to self with the past 6 months Suicidal Ideation: Yes-Currently Present Has patient been a risk to self within the past 6 months prior to admission? : Yes Suicidal Intent: Yes-Currently Present Has patient had any suicidal intent within the past 6 months prior to admission? : No Is patient at risk for suicide?: Yes Suicidal Plan?: Yes-Currently Present Specify Current Suicidal Plan: overdose on visteril Access to Means: Yes Specify Access to Suicidal Means: recent prescription What has been your use of drugs/alcohol within the last 12 months?: THC, Alcohol Previous Attempts/Gestures: Yes How many times?: 1 Other Self Harm Risks: na Triggers for Past Attempts: Family contact Intentional Self Injurious Behavior: None Family Suicide History: No Recent stressful life event(s): Conflict (Comment), Loss (Comment), Other (Comment) (recently loss custody of all 3 children) Persecutory voices/beliefs?: No Depression: Yes Depression Symptoms: Loss of interest in usual pleasures, Guilt, Feeling worthless/self pity, Feeling angry/irritable (hopelessness) Substance abuse history and/or treatment for substance abuse?: Yes  Risk to Others within the past 6 months Homicidal Ideation: No-Not Currently/Within Last 6 Months Does patient have any lifetime risk of violence toward others beyond the six months prior to admission? : No Thoughts of Harm to Others: No-Not Currently Present/Within Last 6 Months Current Homicidal Intent: No-Not Currently/Within Last 6 Months Current Homicidal Plan: No-Not Currently/Within Last 6 Months Access to Homicidal Means: No History of harm to others?:  No Assessment of Violence: None Noted Does patient have access to weapons?: No Criminal Charges Pending?: Yes Describe Pending Criminal Charges: contributing to the delinqecy of a minor Does patient have a court date:  (unknown) Is patient on probation?: No  Psychosis Hallucinations: None noted Delusions: None noted  Mental Status Report Appearance/Hygiene: Unremarkable Eye Contact: Fair Motor Activity: Freedom of movement Speech: Logical/coherent, Slow Level of Consciousness: Alert Mood: Depressed, Anxious Affect: Anxious, Depressed Anxiety Level: Minimal Panic attack frequency: daily Most recent panic attack: 5/27 Thought Processes: Relevant Judgement: Impaired Orientation: Person, Place, Time, Situation Obsessive Compulsive Thoughts/Behaviors: None  Cognitive Functioning Concentration: Poor Memory: Recent Intact, Remote Intact IQ: Average Insight: Poor Impulse Control: Poor Appetite: Poor Weight Loss: 0 Weight Gain: 0 Sleep: Decreased Total Hours of Sleep: 3 Vegetative Symptoms: None  ADLScreening [facility] Assessment Services) Patient's cognitive ability adequate to safely complete daily activities?: Yes Patient able to express need for assistance with ADLs?: Yes Independently performs ADLs?: Yes (appropriate for developmental age)  Prior Inpatient Therapy Prior Inpatient Therapy: Yes Prior Therapy Dates: 2010 and 2017 [facility] Reason for Treatment: SI, Depression  Prior Outpatient Therapy Prior Outpatient Therapy: Yes Prior Therapy Dates: currently Prior Therapy Facilty/Provider(s): [facility] Reason for Treatment: therapy, med management Does patient have an ACCT team?: No Does patient have Intensive In-House Services?  : No Does patient have [facility] services? : No Does  patient have P4CC services?: No  ADL Screening (condition at time of admission) Patient's cognitive ability adequate to safely complete daily  activities?: Yes Patient able to express need for assistance with ADLs?: Yes Independently performs ADLs?: Yes (appropriate for developmental age)       Abuse/Neglect Assessment (Assessment to be complete while patient is alone) Physical Abuse: Denies Verbal Abuse: Denies Sexual Abuse: Denies Exploitation of patient/patient's resources: Denies Self-Neglect: Denies Values / Beliefs Cultural Requests During Hospitalization: None Spiritual Requests During Hospitalization: None Consults Spiritual Care Consult Needed: No Social Work Consult Needed: No Merchant navy officerAdvance Directives (For Healthcare) Does patient have an advance directive?: No Would patient like information on creating an advanced directive?: No - patient declined information    Additional Information 1:1 In Past 12 Months?: No CIRT Risk: No Elopement Risk: No Does patient have medical clearance?: Yes     Disposition:  Disposition Initial Assessment Completed for this Encounter: Yes Disposition of Patient: Inpatient treatment program Type of inpatient treatment program: Adult  On Site Evaluation by:   Reviewed with Physician:    Maryelizabeth Rowanorbett, Goro Wenrick A 02/29/2016 6:40 PM

## 2016-02-29 NOTE — ED Notes (Signed)
Patient noted in rest room. No complaints, stable, in no acute distress. Q15 minute rounds and monitoring via Security Cameras to continue.  

## 2016-02-29 NOTE — ED Notes (Signed)
Patient noted in room. No complaints, stable, in no acute distress. Q15 minute rounds and monitoring via Security Cameras to continue.  

## 2016-02-29 NOTE — ED Notes (Signed)
Pt. Transferred to [facility] from ED to room 36. Pt. Oriented to unit including Q15 minute rounds as well as the security cameras for their protection. Patient is alert and oriented, warm and dry in no acute distress. Patient denies SI, HI, and AVH. Pt. Encouraged to let me know if needs arise.

## 2016-02-29 NOTE — ED Provider Notes (Signed)
CSN: [id number]     Arrival date & time 02/29/16  1642 History   First MD Initiated Contact with Patient 02/29/16 1714     Chief Complaint  Patient presents with  . Suicidal     The history is provided by the patient. No language interpreter was used.   Caitlin Bell is a 25 y.o. female who presents to the Emergency Department complaining of SI.  She brings herself to the ED today for SI.  A week ago her kids were taken away from her (four children) to live with her mother.  She has experienced increased depression since that time.  Today when she woke up she walked to the bathroom and when she saw her children's belongings she experienced worsening SI. She opened a bottle of pills and thought about overdosing but she was able to stop herself.  She came in seeking help.  She currently lives at home alone.  She was recently seen by Psychiatry and started on medications but does not understand how to take the prescription so she is not always compliant.  Sxs are moderate, waxing and waning, worsening.    Past Medical History  Diagnosis Date  . Cerebral palsy (HCC)     mild, speech impediment  . Anxiety   . Gestational diabetes   . Depression    Past Surgical History  Procedure Laterality Date  . Cholecystectomy  2014  . Hernia repair     No family history on file. Social History  Substance Use Topics  . Smoking status: Former Smoker    Quit date: 10/31/2011  . Smokeless tobacco: Never Used  . Alcohol Use: Yes     Comment: ocasionally, not with the pregnancy   OB History    Gravida Para Term Preterm AB TAB SAB Ectopic Multiple Living   4 3 1 2  0 0 0 0 0 3     Review of Systems  All other systems reviewed and are negative.     Allergies  Review of patient's allergies indicates no known allergies.  Home Medications   Prior to Admission medications   Medication Sig Start Date End Date Taking? Authorizing Provider  clonazePAM (KLONOPIN) 0.5 MG tablet Take 0.25-0.5 mg  by mouth at bedtime as needed for anxiety.   Yes Historical Provider, MD  diphenhydrAMINE (SOMINEX) 25 MG tablet Take 25 mg by mouth at bedtime as needed for allergies.   Yes Historical Provider, MD  hydrOXYzine (ATARAX/VISTARIL) 25 MG tablet Take 1 tablet (25 mg total) by mouth every 6 (six) hours as needed for anxiety. 02/06/16  Yes Adonis BrookSheila Agustin, NP  Iloperidone (FANAPT) 1 MG TABS Take 1-4 mg by mouth See admin instructions. Take 1mg  twice a day for 4 days;  then take 2mg  twice a day for 4 days;  then take 2mg  in the morning and 4mg  every evening for 6 days.   Yes Historical Provider, MD  QUEtiapine (SEROQUEL) 25 MG tablet Take 3 tablets (75 mg total) by mouth at bedtime. 02/06/16  Yes Adonis BrookSheila Agustin, NP   BP 115/83 mmHg  Pulse 106  Temp(Src) 98.3 F (36.8 C) (Oral)  Resp 16  Ht 5\' 4"  (1.626 m)  Wt 125 lb (56.7 kg)  BMI 21.45 kg/m2  SpO2 100%  LMP 02/29/2016 Physical Exam  Constitutional: She is oriented to person, place, and time. She appears well-developed and well-nourished.  HENT:  Head: Normocephalic and atraumatic.  Cardiovascular: Regular rhythm.   No murmur heard. tachycardic  Pulmonary/Chest: Effort normal  and breath sounds normal. No respiratory distress.  Abdominal: Soft. There is no tenderness. There is no rebound and no guarding.  Musculoskeletal: She exhibits no edema or tenderness.  Neurological: She is alert and oriented to person, place, and time.  Skin: Skin is warm and dry.  Psychiatric:  Flat mood and affect  Nursing note and vitals reviewed.   ED Course  Procedures (including critical care time) Labs Review Labs Reviewed  COMPREHENSIVE METABOLIC PANEL  CBC WITH DIFFERENTIAL/PLATELET  ACETAMINOPHEN LEVEL  URINE RAPID DRUG SCREEN, HOSP PERFORMED  ETHANOL  POC URINE PREG, ED    Imaging Review No results found. I have personally reviewed and evaluated these images and lab results as part of my medical decision-making.   EKG Interpretation None       MDM   Final diagnoses:  None    Patient here with SI, hx/o prior suicide attempt.  She is calm and cooperative in the ED. Plan to check screening labs, consult TTS once medically cleared.      Tilden Fossa, MD 02/29/16 [phone]

## 2016-03-01 ENCOUNTER — Encounter (HOSPITAL_COMMUNITY): Payer: Self-pay | Admitting: *Deleted

## 2016-03-01 ENCOUNTER — Inpatient Hospital Stay (HOSPITAL_COMMUNITY)
Admission: AD | Admit: 2016-03-01 | Discharge: 2016-03-08 | DRG: 885 | Disposition: A | Discharge status: Home / Self Care | Payer: Medicaid Other | Source: Intra-hospital | Attending: Psychiatry | Admitting: Psychiatry

## 2016-03-01 DIAGNOSIS — F314 Bipolar disorder, current episode depressed, severe, without psychotic features: Secondary | ICD-10-CM

## 2016-03-01 DIAGNOSIS — F319 Bipolar disorder, unspecified: Secondary | ICD-10-CM | POA: Diagnosis present

## 2016-03-01 DIAGNOSIS — R45851 Suicidal ideations: Secondary | ICD-10-CM

## 2016-03-01 DIAGNOSIS — Z915 Personal history of self-harm: Secondary | ICD-10-CM | POA: Diagnosis not present

## 2016-03-01 DIAGNOSIS — G809 Cerebral palsy, unspecified: Secondary | ICD-10-CM | POA: Diagnosis present

## 2016-03-01 DIAGNOSIS — G4700 Insomnia, unspecified: Secondary | ICD-10-CM | POA: Diagnosis present

## 2016-03-01 DIAGNOSIS — F315 Bipolar disorder, current episode depressed, severe, with psychotic features: Principal | ICD-10-CM | POA: Diagnosis present

## 2016-03-01 DIAGNOSIS — F3113 Bipolar disorder, current episode manic without psychotic features, severe: Secondary | ICD-10-CM | POA: Diagnosis not present

## 2016-03-01 DIAGNOSIS — F3130 Bipolar disorder, current episode depressed, mild or moderate severity, unspecified: Secondary | ICD-10-CM | POA: Diagnosis present

## 2016-03-01 DIAGNOSIS — Z87891 Personal history of nicotine dependence: Secondary | ICD-10-CM | POA: Diagnosis not present

## 2016-03-01 HISTORY — DX: Bipolar disorder, unspecified: F31.9

## 2016-03-01 MED ORDER — TRAZODONE HCL 100 MG PO TABS
100.0000 mg | ORAL_TABLET | Freq: Every day | ORAL | Status: DC
  Filled 2016-03-01 (×3): qty 1

## 2016-03-01 MED ORDER — HYDROXYZINE HCL 25 MG PO TABS
25.0000 mg | ORAL_TABLET | Freq: Four times a day (QID) | ORAL | Status: DC | PRN
  Administered 2016-03-01 (×2): 25 mg via ORAL
  Filled 2016-03-01 (×2): qty 1

## 2016-03-01 MED ORDER — ALUM & MAG HYDROXIDE-SIMETH 200-200-20 MG/5ML PO SUSP: 30.0000 mL | ORAL | Status: DC | PRN

## 2016-03-01 MED ORDER — ILOPERIDONE 4 MG PO TABS
2.0000 mg | ORAL_TABLET | Freq: Two times a day (BID) | ORAL | Status: DC
  Administered 2016-03-01: 2 mg via ORAL
  Filled 2016-03-01 (×2): qty 1

## 2016-03-01 MED ORDER — MAGNESIUM HYDROXIDE 400 MG/5ML PO SUSP: 30.0000 mL | Freq: Every day | ORAL | Status: DC | PRN

## 2016-03-01 MED ORDER — ILOPERIDONE 4 MG PO TABS
2.0000 mg | ORAL_TABLET | Freq: Two times a day (BID) | ORAL | Status: DC
  Administered 2016-03-01 – 2016-03-02 (×2): 2 mg via ORAL
  Filled 2016-03-01 (×7): qty 1

## 2016-03-01 MED ORDER — ACETAMINOPHEN 325 MG PO TABS
650.0000 mg | ORAL_TABLET | Freq: Four times a day (QID) | ORAL | Status: DC | PRN
  Administered 2016-03-07: 650 mg via ORAL
  Filled 2016-03-01: qty 2

## 2016-03-01 MED ORDER — TRAZODONE HCL 100 MG PO TABS: 100.0000 mg | ORAL_TABLET | Freq: Every day | ORAL | Status: DC

## 2016-03-01 MED ORDER — HYDROXYZINE HCL 50 MG PO TABS
50.0000 mg | ORAL_TABLET | Freq: Every evening | ORAL | Status: DC | PRN
  Administered 2016-03-01: 50 mg via ORAL
  Filled 2016-03-01 (×7): qty 1

## 2016-03-01 MED ORDER — LURASIDONE HCL 40 MG PO TABS
40.0000 mg | ORAL_TABLET | Freq: Every day | ORAL | Status: DC
  Filled 2016-03-01: qty 1

## 2016-03-01 MED ORDER — LURASIDONE HCL 40 MG PO TABS: 40.0000 mg | ORAL_TABLET | Freq: Every day | ORAL | Status: DC

## 2016-03-01 MED ORDER — LURASIDONE HCL 40 MG PO TABS
40.0000 mg | ORAL_TABLET | Freq: Every day | ORAL | Status: DC
  Administered 2016-03-01: 40 mg via ORAL
  Filled 2016-03-01 (×3): qty 1
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
## 2016-03-01 NOTE — ED Notes (Signed)
Patient noted sleeping in room. No complaints, stable, in no acute distress. Q15 minute rounds and monitoring via Security Cameras to continue.  

## 2016-03-01 NOTE — BH Assessment (Signed)
[facility] Assessment Progress Note  Per Thedore MinsMojeed Akintayo, MD, this pt requires psychiatric hospitalization at this time.  Berneice Heinrichina Tate, RN, [id number]; they will be ready to receive pt at 11:00.  Pt has signed Voluntary Admission and Consent for Treatment, as well as Consent to Release Information to Pauletta BrownsBobby Bowman at [facility], her outpatient provider, and signed forms [facility]  Pt's nurse, Kendal Hymendie, has been notified, and agrees to send original paperwork along with pt via [facility], and to call report to [phone].  Doylene Canninghomas Bryley Kovacevic, MA Triage Specialist [phone]

## 2016-03-01 NOTE — Tx Team (Signed)
Initial Interdisciplinary Treatment Plan   PATIENT STRESSORS: Financial difficulties Legal issue Loss of custody of children Marital or family conflict Substance abuse   PATIENT STRENGTHS: Barrister's clerkCommunication skills Motivation for treatment/growth Physical Health Supportive family/friends   PROBLEM LIST: Problem List/Patient Goals Date to be addressed Date deferred Reason deferred Estimated date of resolution  At risk for suicide 03/01/2016    D/C  Depression 03/01/2016    D/C  Anxiety 03/01/2016    D/C  "Learnig more coping skills" 03/01/2016    D/C  "Learn how to calm myself down" 03/01/2016    D/C                           DISCHARGE CRITERIA:  Ability to meet basic life and health needs Adequate post-discharge living arrangements Improved stabilization in mood, thinking, and/or behavior Motivation to continue treatment in a less acute level of care Need for constant or close observation no longer present Reduction of life-threatening or endangering symptoms to within safe limits Verbal commitment to aftercare and medication compliance  PRELIMINARY DISCHARGE PLAN: Outpatient therapy Return to previous living arrangement  PATIENT/FAMIILY INVOLVEMENT: This treatment plan has been presented to and reviewed with the patient, Caitlin Bell.  The patient and family have been given the opportunity to ask questions and make suggestions.  Larry SierrasMiddleton, Derrel Moore P 03/01/2016, 12:59 PM
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
## 2016-03-01 NOTE — Progress Notes (Signed)
Adult Psychoeducational Group Note  Date:  03/01/2016 Time:  8:20 PM  Group Topic/Focus:  Wrap-Up Group:   The focus of this group is to help patients review their daily goal of treatment and discuss progress on daily workbooks.  Participation Level:  Active  Participation Quality:  Appropriate  Affect:  Appropriate  Cognitive:  Appropriate  Insight: Appropriate  Engagement in Group:  Engaged  Modes of Intervention:  Discussion  Additional Comments:  Pt rated his overall day a 5 out of 10 and stated that her day was "okay". Pt reported that she did not have a goal for the day as it was her first day here.   Cleotilde NeerJasmine S Inez Rosato 03/01/2016, 8:57 PM

## 2016-03-01 NOTE — ED Notes (Signed)
Pt discharged ambulatory with [place] driver.  All belongings were sent including cash from security and medications from pharmacy.

## 2016-03-02 ENCOUNTER — Encounter (HOSPITAL_COMMUNITY): Payer: Self-pay | Admitting: Psychiatry

## 2016-03-02 DIAGNOSIS — F314 Bipolar disorder, current episode depressed, severe, without psychotic features: Secondary | ICD-10-CM

## 2016-03-02 MED ORDER — ILOPERIDONE 4 MG PO TABS
2.0000 mg | ORAL_TABLET | Freq: Two times a day (BID) | ORAL | Status: DC
  Administered 2016-03-02 – 2016-03-08 (×12): 2 mg via ORAL
  Filled 2016-03-02 (×16): qty 1

## 2016-03-02 MED ORDER — CLONAZEPAM 0.5 MG PO TABS: 0.5000 mg | ORAL_TABLET | Freq: Three times a day (TID) | ORAL | Status: DC | PRN

## 2016-03-02 MED ORDER — CLONAZEPAM 0.5 MG PO TABS
0.5000 mg | ORAL_TABLET | Freq: Three times a day (TID) | ORAL | Status: DC | PRN
Start: 2016-03-02 — End: 2016-03-09
  Administered 2016-03-02 – 2016-03-08 (×12): 0.5 mg via ORAL
  Filled 2016-03-02 (×12): qty 1

## 2016-03-02 NOTE — [facility] Counselor (Signed)
Adult Comprehensive Assessment  Patient ID: Caitlin Bell, female DOB: 05/09/1991, 25 y.o. 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 Family Relationships: Denies Chief Technology Officerstressors Financial / Lack of resources (include bankruptcy): Denies stressors Housing / Lack of housing: Recently lost housing when she lost custody of her children; she is looking for a place to stay Physical health (include injuries & life threatening diseases): Denies stressors Social relationships: Denies stressors Substance abuse: Daily alcohol use Bereavement / Loss: Denies stressors  Living/Environment/Situation:  Living Arrangements: Alone Living conditions (as described by patient or guardian): recently lost housing How long has patient lived in current situation?: "recently homeless" What is atmosphere in current home: Transient, Chaotic  Family History:  Marital status: Single Are you sexually active?: Yes What is your sexual orientation?: Straight Has your sexual activity been affected by drugs, alcohol, medication, or emotional stress?: None Does patient have children?: Yes How many children?: 4 How is patient's relationship with their children?: 2yo, 1yo, and 57mo - relationship is good. Child Protective Services is involved with her because of her mental health, and they provide support. Also has a 8yo daughter who lives with pt's mother - reports a good relationship there also.  Childhood History:  By whom was/is the patient raised?: Mother Additional childhood history information: No contact with father Description of patient's relationship with caregiver when they were a child: Good relationship with mother as a child Patient's description of current relationship with people who raised him/her: Pt's 8yo daughter lives with mother, so that puts a  strain on their relationship. Pt just got her younger 3 children back from mother about a month ago. How were you disciplined when you got in trouble as a child/adolescent?: Spanked, spanked Does patient have siblings?: Yes Number of Siblings: 1 Description of patient's current relationship with siblings: 1 half-sister, very good relationship Did patient suffer any verbal/emotional/physical/sexual abuse as a child?: No Did patient suffer from severe childhood neglect?: No Has patient ever been sexually abused/assaulted/raped as an adolescent or adult?: No Was the patient ever a victim of a crime or a disaster?: No Witnessed domestic violence?: No Has patient been effected by domestic violence as an adult?: No  Education:  Highest grade of school patient has completed: 12th grade Currently a student?: No Learning disability?: No  Employment/Work Situation:  Employment situation: Unemployed What is the longest time patient has a held a job?: 3 months 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 Has patient ever been in the Eli Lilly and Companymilitary?: No Are There Guns or Other Weapons in Your Home?: No  Financial Resources:  Surveyor, quantityinancial resources: Actoreceives [employer] (TANF), Medicaid, Sales executiveood stamps Does patient have a Lawyerrepresentative payee or guardian?: No  Alcohol/Substance Abuse:  What has been your use of drugs/alcohol within the last 12 months?: Small bottle of Smirnoff daily Alcohol/Substance Abuse Treatment Hx: Denies past history Has alcohol/substance abuse ever caused legal problems?: No  Social Support System:  Forensic psychologistatient's Community Support System: Fair Museum/gallery exhibitions officerDescribe Community Support System: Mom, half-sister Type of faith/religion: None  Leisure/Recreation:  Leisure and Hobbies: Higher education careers adviserWatches TV, reads books  Strengths/Needs:  What things does the patient do well?: Likes to organize In what areas does patient struggle / problems for patient: Mental health,  Auditory/visual hallucinations, depression, anxiety  Discharge Plan:  Does patient have access to transportation?: Yes Will patient be returning to same living situation after discharge?: Yes  Currently receiving community mental health services: Yes- [facility] If no, would patient like referral for services when discharged?: No Does patient have financial barriers related to discharge medications?:Yes, losing her Medicaid  Summary/Recommendations:  Patient is a 25 year old female with a diagnosis of Bipolar Affective Disorder, depressed, severe. Pt presented to the hospital with thoughts of suicide. Pt reports primary trigger(s) for admission was recently losing custody of children and losing her housing. Patient will benefit from crisis stabilization, medication evaluation, group therapy and psycho education in addition to case management for discharge planning. At discharge it is recommended that Pt remain compliant with established discharge plan and continued treatment.  Chad Cordial, LCSWA Clinical Social Work [phone]

## 2016-03-02 NOTE — [facility] Group Notes (Signed)
[facility] LCSW Group Therapy 03/02/2016 1:15 PM  Type of Therapy: Group Therapy- Feelings about Diagnosis  Participation Level: Active   Participation Quality:  Appropriate  Affect:  Appropriate  Cognitive: Alert and Oriented   Insight:  Developing   Engagement in Therapy: Developing/Improving and Engaged   Modes of Intervention: Clarification, Confrontation, Discussion, Education, Exploration, Limit-setting, Orientation, Problem-solving, Rapport Building, Dance movement psychotherapisteality Testing, Socialization and Support  Description of Group:   This group will allow patients to explore their thoughts and feelings about diagnoses they have received. Patients will be guided to explore their level of understanding and acceptance of these diagnoses. Facilitator will encourage patients to process their thoughts and feelings about the reactions of others to their diagnosis, and will guide patients in identifying ways to discuss their diagnosis with significant others in their lives. This group will be process-oriented, with patients participating in exploration of their own experiences as well as giving and receiving support and challenge from other group members.  Summary of Progress/Problems:  Pt discussed how she feels that her depression is minimized by others as she often hears, "Well, everyone gets depressed sometimes." Pt also expressed her frustration that people view her as less capable, especially as a mother, due to her mental health conditions. Pt demonstrates developing insight AEB her ability to identify pride as something that keeps her from reaching out for help; she discusses not wanting to admit to herself that she is not strong enough to do it on her own.  Therapeutic Modalities:   Cognitive Behavioral Therapy Solution Focused Therapy Motivational Interviewing Relapse Prevention Therapy  Chad CordialLauren Carter, LCSWA 03/02/2016 3:52 PM

## 2016-03-02 NOTE — Progress Notes (Signed)
[facility] Group Notes:  (Nursing/MHT/Case Management/Adjunct)  Date:  03/02/2016  Time: 2100 Type of Therapy:  wrap up group  Participation Level:  Active  Participation Quality:  Appropriate, Attentive, Sharing and Supportive  Affect:  Depressed and Flat  Cognitive:  Lacking  Insight:  Lacking  Engagement in Group:  Engaged  Modes of Intervention:  Clarification, Education and Support  Summary of Progress/Problems:  Pt shared that she experiences panic when she talks or thinks about her kids whom she lost to CPS. Pt is encouraged to reach out to staff when she starts to feel panic. Pt shared that she needs to remember to take her medicine but reports having no supports to help her do so.  Pt did enjoy playing cards with peer patients today.   Marcille BuffyMcNeil, Tandy Lewin S 03/02/2016, 9:53 PM

## 2016-03-02 NOTE — H&P (Signed)
Psychiatric Admission Assessment Adult  Patient Identification: Caitlin Bell MRN:  [id number] Date of Evaluation:  03/02/2016 Chief Complaint:   " I have been having suicidal thoughts " Principal Diagnosis:  Bipolar Disorder, Depressed  Diagnosis:   Patient Active Problem List   Diagnosis Date Noted  . Bipolar affective disorder, depressed, severe ([place]) [F31.4] 03/01/2016  . Bipolar affective disorder, current episode severe ([place]) [F31.9] 03/01/2016  . Bipolar affective disorder, depressed, severe, with psychotic behavior ([place]) [F31.5] 02/02/2016  . Normal labor [O80, Z37.9] 10/31/2014  . Bipolar disorder ([place]) [F31.9] 10/31/2014  . Cholestasis of pregnancy [O26.619, K83.1] 10/31/2014  . H/O anorexia nervosa [Z86.59] 10/31/2014  . H/O bulimia nervosa [Z86.59] 10/31/2014  . Gestational diabetes [O24.419] 10/31/2014  . Depression--on Latuda [F32.9] 10/31/2014  . Umbilical hernia [E70.3] 10/31/2014  . Hx of preterm delivery  [O09.219] 10/31/2014  . Preterm delivery [O60.10X0] 10/31/2014  . Generalized anxiety disorder [F41.1] 10/31/2014  . History of marijuana use [Z87.898] 10/31/2014  . Cerebral palsy ([place]) [G80.9] 10/31/2014  . [redacted] weeks gestation of pregnancy [Z3A.35]   . Hx of PTL (preterm labor), current pregnancy [O09.219] 10/30/2014  . Preterm labor [O60.10X0] 10/30/2014  . Back pain complicating pregnancy in third trimester [O26.893, M54.9] 10/14/2014   History of Present Illness: 25 year old female, known to our unit from prior admission. She reports worsening depression and suicidal ideations, with a thought of overdosing on prescribed medications . States " I actually had the pills in my hand ", but decided to call a friend, who brought her to the hospital. She has been facing severe psycho-social stressors, including losing custody of her three children ( the three of them are now with patient's mother ) . Patient reports she has a poor relationship with her mother, and  so fears she may not be able to see her children often. Patient also states that since she no longer has her children, she may be losing her home, which was through a governmental program " since I don't have the kids with me, I may lose the benefit "   Associated Signs/Symptoms: Depression Symptoms:  depressed mood, anhedonia, insomnia, feelings of worthlessness/guilt, suicidal thoughts with specific plan, anxiety, (Hypo) Manic Symptoms:   Denies  Anxiety Symptoms:  States she has been ruminating about her children, describes anxiety and worrying, occasional panic attacks  Psychotic Symptoms:   Denies - states she has had some fleeting visual hallucinations in the past, but not at this time PTSD Symptoms:  Total Time spent with patient: 45 minutes  Past Psychiatric History: Has had several psychiatric admissions for mood disorder and anxiety b ( most recently admitted 02/03/16. To [facility] , for similar symptoms- depression ).  States she has been diagnosed with Bipolar Disorder, and describes episodes of hypomania  . Denies history of suicide attempts. (+)  history of self cutting , but  stopped about one year ago.  Describes history of panic attacks and excessive worrying, anxiety, mainly related to her children. Denies history of violence   Is the patient at risk to self? Yes.    Has the patient been a risk to self in the past 6 months? Yes.    Has the patient been a risk to self within the distant past? Yes.    Is the patient a risk to others? No.  Has the patient been a risk to others in the past 6 months? No.  Has the patient been a risk to others within the distant past? No.  Prior Inpatient Therapy:  as above  Prior Outpatient Therapy:  Follows up at [facility], in [place]  Alcohol Screening: 1. How often do you have a drink containing alcohol?: 2 to 3 times a week 2. How many drinks containing alcohol do you have on a typical day when you are drinking?: 1 or 2 3. How  often do you have six or more drinks on one occasion?: Never Preliminary Score: 0 9. Have you or someone else been injured as a result of your drinking?: No 10. Has a relative or friend or a doctor or another health worker been concerned about your drinking or suggested you cut down?: No Alcohol Use Disorder Identification Test Final Score (AUDIT): 3 Brief Intervention: AUDIT score less than 7 or less-screening does not suggest unhealthy drinking-brief intervention not indicated Substance Abuse History in the last 12 months:  Smokes cannabis, which she states is occasional , denies alcohol abuse , denies any history of IVDA  Consequences of Substance Abuse: Denies  Previous Psychotropic Medications:  Had been prescribed Seroquel , but states that more recently she was switched to [place], but describes sub optimal compliance . Still, feels this combination has been helpful, no side effects  Psychological Evaluations:  No  Past Medical History:  Past Medical History  Diagnosis Date  . Cerebral palsy (HCC)     mild, speech impediment  . Anxiety   . Gestational diabetes   . Depression   . Bipolar disorder [facility])     Past Surgical History  Procedure Laterality Date  . Cholecystectomy  2014  . Hernia repair    Family History: parents alive, divorced. Has a good relationship with mother, no contact with father. Has two siblings  Family Psychiatric History: Sister has history of Bipolar Disorder  , states someone in her father's family committed suicide but is not sure of who, states there is a history of substance abuse in her father's extended family  Tobacco Screening:  Does not smoke  Social History:  Single, has three children , currently in the custody of her mother, lives alone at this time , has upcoming charge related to cannabis use, no SO, no source of income at this time  History  Alcohol Use  . Yes    Comment: ocasionally, not with the pregnancy     History   Drug Use No    Comment: hx of marijuana use 3 weeks ago    Additional Social History:   Allergies:  No Known Allergies Lab Results:  Results for orders placed or performed during the hospital encounter of 02/29/16 (from the past 48 hour(s))  Comprehensive metabolic panel     Status: Abnormal   Collection Time: 02/29/16  5:49 PM  Result Value Ref Range   Sodium 141 135 - 145 mmol/L   Potassium 4.8 3.5 - 5.1 mmol/L   Chloride 110 101 - 111 mmol/L   CO2 24 22 - 32 mmol/L   Glucose, Bld 83 65 - 99 mg/dL   BUN 12 6 - 20 mg/dL   Creatinine, Ser 0.87 0.44 - 1.00 mg/dL   Calcium 9.2 8.9 - 10.3 mg/dL   Total Protein 7.7 6.5 - 8.1 g/dL   Albumin 4.6 3.5 - 5.0 g/dL   AST 40 15 - 41 U/L   ALT 31 14 - 54 U/L   Alkaline Phosphatase 102 38 - 126 U/L   Total Bilirubin 1.3 (H) 0.3 - 1.2 mg/dL   GFR calc non Af Amer >  60 >60 mL/min   GFR calc Af Amer >60 >60 mL/min    Comment: (NOTE) The eGFR has been calculated using the CKD EPI equation. This calculation has not been validated in all clinical situations. eGFR's persistently <60 mL/min signify possible Chronic Kidney Disease.    Anion gap 7 5 - 15  CBC with Differential     Status: None   Collection Time: 02/29/16  5:49 PM  Result Value Ref Range   WBC 6.7 4.0 - 10.5 K/uL   RBC 4.42 3.87 - 5.11 MIL/uL   Hemoglobin 12.7 12.0 - 15.0 g/dL   HCT 37.6 36.0 - 46.0 %   MCV 85.1 78.0 - 100.0 fL   MCH 28.7 26.0 - 34.0 pg   MCHC 33.8 30.0 - 36.0 g/dL   RDW 14.5 11.5 - 15.5 %   Platelets 278 150 - 400 K/uL   Neutrophils Relative % 56 %   Neutro Abs 3.7 1.7 - 7.7 K/uL   Lymphocytes Relative 30 %   Lymphs Abs 2.0 0.7 - 4.0 K/uL   Monocytes Relative 9 %   Monocytes Absolute 0.6 0.1 - 1.0 K/uL   Eosinophils Relative 5 %   Eosinophils Absolute 0.3 0.0 - 0.7 K/uL   Basophils Relative 0 %   Basophils Absolute 0.0 0.0 - 0.1 K/uL  Acetaminophen level     Status: Abnormal   Collection Time: 02/29/16  5:49 PM  Result Value Ref Range    Acetaminophen (Tylenol), Serum <10 (L) 10 - 30 ug/mL    Comment:        THERAPEUTIC CONCENTRATIONS VARY SIGNIFICANTLY. A RANGE OF 10-30 ug/mL MAY BE AN EFFECTIVE CONCENTRATION FOR MANY PATIENTS. HOWEVER, SOME ARE BEST TREATED AT CONCENTRATIONS OUTSIDE THIS RANGE. ACETAMINOPHEN CONCENTRATIONS >150 ug/mL AT 4 HOURS AFTER INGESTION AND >50 ug/mL AT 12 HOURS AFTER INGESTION ARE OFTEN ASSOCIATED WITH TOXIC REACTIONS.   Ethanol     Status: None   Collection Time: 02/29/16  5:49 PM  Result Value Ref Range   Alcohol, Ethyl (B) <5 <5 mg/dL    Comment:        LOWEST DETECTABLE LIMIT FOR SERUM ALCOHOL IS 5 mg/dL FOR MEDICAL PURPOSES ONLY   Urine rapid drug screen (hosp performed)     Status: None   Collection Time: 02/29/16  7:15 PM  Result Value Ref Range   Opiates NONE DETECTED NONE DETECTED   Cocaine NONE DETECTED NONE DETECTED   Benzodiazepines NONE DETECTED NONE DETECTED   Amphetamines NONE DETECTED NONE DETECTED   Tetrahydrocannabinol NONE DETECTED NONE DETECTED   Barbiturates NONE DETECTED NONE DETECTED    Comment:        DRUG SCREEN FOR MEDICAL PURPOSES ONLY.  IF CONFIRMATION IS NEEDED FOR ANY PURPOSE, NOTIFY LAB WITHIN 5 DAYS.        LOWEST DETECTABLE LIMITS FOR URINE DRUG SCREEN Drug Class       Cutoff (ng/mL) Amphetamine      1000 Barbiturate      200 Benzodiazepine   697 Tricyclics       948 Opiates          300 Cocaine          300 THC              50   POC urine preg, ED     Status: None   Collection Time: 02/29/16  7:25 PM  Result Value Ref Range   Preg Test, Ur NEGATIVE NEGATIVE    Comment:  THE SENSITIVITY OF THIS METHODOLOGY IS >24 mIU/mL     Blood Alcohol level:  Lab Results  Component Value Date   [facility] <5 02/29/2016   ETH <5 57/32/2025    Metabolic Disorder Labs:  Lab Results  Component Value Date   HGBA1C 5.6 02/04/2016   MPG 114 02/04/2016   Lab Results  Component Value Date   PROLACTIN 32.8* 02/04/2016   Lab Results   Component Value Date   CHOL 139 02/04/2016   TRIG 86 02/04/2016   HDL 53 02/04/2016   CHOLHDL 2.6 02/04/2016   VLDL 17 02/04/2016   LDLCALC 69 02/04/2016    Current Medications: Current Facility-Administered Medications  Medication Dose Route Frequency Provider Last Rate Last Dose  . acetaminophen (TYLENOL) tablet 650 mg  650 mg Oral Q6H PRN Patrecia Pour, NP      . alum & mag hydroxide-simeth (MAALOX/MYLANTA) 200-200-20 MG/5ML suspension 30 mL  30 mL Oral Q4H PRN Patrecia Pour, NP      . hydrOXYzine (ATARAX/VISTARIL) tablet 25 mg  25 mg Oral Q6H PRN Patrecia Pour, NP   25 mg at 03/01/16 2051  . hydrOXYzine (ATARAX/VISTARIL) tablet 50 mg  50 mg Oral QHS,MR X 1 Laverle Hobby, PA-C   50 mg at 03/01/16 2153  . iloperidone (FANAPT) tablet 2 mg  2 mg Oral BID Patrecia Pour, NP   2 mg at 03/02/16 0731  . lurasidone (LATUDA) tablet 40 mg  40 mg Oral QHS Patrecia Pour, NP   40 mg at 03/01/16 2125  . magnesium hydroxide (MILK OF MAGNESIA) suspension 30 mL  30 mL Oral Daily PRN Patrecia Pour, NP      . traZODone (DESYREL) tablet 100 mg  100 mg Oral QHS Patrecia Pour, NP   100 mg at 03/01/16 2159   PTA Medications: Prescriptions prior to admission  Medication Sig Dispense Refill Last Dose  . hydrOXYzine (ATARAX/VISTARIL) 25 MG tablet Take 1 tablet (25 mg total) by mouth every 6 (six) hours as needed for anxiety. 30 tablet 0 02/27/2016 at Unknown time  . Iloperidone (FANAPT) 1 MG TABS Take 1-4 mg by mouth See admin instructions. Take 41m twice a day for 4 days;  then take 237mtwice a day for 4 days;  then take 77m77mn the morning and 4mg40mery evening for 6 days.   unknown    Musculoskeletal: Strength & Muscle Tone: within normal limits Gait & Station: normal Patient leans: N/A  Psychiatric Specialty Exam: Physical Exam  Review of Systems  Constitutional: Negative.   HENT: Negative.   Eyes: Negative.   Respiratory: Negative.   Cardiovascular: Negative.   Gastrointestinal:  Negative.   Genitourinary: Negative.   Musculoskeletal: Negative.   Skin: Negative.   Neurological: Negative for seizures.  Endo/Heme/Allergies: Negative.   Psychiatric/Behavioral: Positive for depression and suicidal ideas.  All other systems reviewed and are negative.   Blood pressure 101/62, pulse 113, temperature 97.7 F (36.5 C), temperature source Oral, resp. rate 18, height 5' 4"  (1.626 m), weight 128 lb (58.06 kg), last menstrual period 02/29/2016, SpO2 100 %, not currently breastfeeding.Body mass index is 21.96 kg/(m^2).  General Appearance: Fairly Groomed  Eye Contact:  Good  Speech:  Normal Rate  Volume:  Decreased  Mood:  Depressed  Affect:  Constricted  Thought Process:  Linear  Orientation:  Other:  fully alert and attentive   Thought Content:  no hallucinations , no delusions at this time, currently not internally preoccupied  Suicidal Thoughts:  No denies current plan or intention of hurting self or of suicide, contracts for safety on the unit   Homicidal Thoughts:  No  Memory:  recent and remote grossly intact   Judgement:  Fair  Insight:  Fair  Psychomotor Activity:  Normal  Concentration:  Concentration: Good and Attention Span: Good  Recall:  Good  Fund of Knowledge:  Good  Language:  Good  Akathisia:  Negative  Handed:  Right  AIMS (if indicated):     Assets:  Desire for Improvement Resilience  ADL's:  Intact  Cognition:  WNL  Sleep:  Number of Hours: 6.25       Treatment Plan Summary: Daily contact with patient to assess and evaluate symptoms and progress in treatment, Medication management, Plan inpatient admission and medications as below   Observation Level/Precautions:  15 minute checks  Laboratory: as needed   Psychotherapy:  Milieu, support   Medications:  For  Now , will continue Fanapt 2 mgrs BID, and gradually titrate, as noted, she states this medication is helpful, and denies side effects. Reports Trazodone causes side effects, so  will discontinue  Continue Klonopin 0.5 mgrs TID PRN for anxiety   Consultations:  As needed   Discharge Concerns:  -  Estimated LOS:5-6 days   Other:     I certify that inpatient services furnished can reasonably be expected to improve the patient's condition.    Neita Garnet, MD 5/30/201711:48 AM
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
## 2016-03-02 NOTE — [facility] Suicide Risk Assessment (Signed)
[facility] INPATIENT:  Family/Significant Other Suicide Prevention Education  Suicide Prevention Education:  Patient Refusal for Family/Significant Other Suicide Prevention Education: The patient Caitlin Bell has refused to provide written consent for family/significant other to be provided Family/Significant Other Suicide Prevention Education during admission and/or prior to discharge.  Physician notified.  Elaina Hoopsarter, Shirlie Enck M 03/02/2016, 11:43 AM

## 2016-03-02 NOTE — Progress Notes (Signed)
D: Pt was in the day room upon initial approach.  Pt has anxious affect and mood.  She reports "I'm okay."  Her goal is to "learn more coping skills and try not to have as many panic attacks."  Pt reports the best part of her day was "I got to eat some good food."  Pt denies SI/HI, denies hallucinations, denies pain.  Pt has been visible in milieu interacting with peers and staff appropriately.  Pt attended evening group.   A: Introduced self to pt.  Actively listened to pt and offered support and encouragement.  Medication education provided.  Pt refused scheduled Trazodone because "it makes me hallucinate like really bad."  On-site provider notified that pt refused Trazodone and still wants something for sleep.  New order obtained.  PRN medication administered for sleep and anxiety. R: Pt is compliant with medications.  Pt verbally contracts for safety.  Will continue to monitor and assess.

## 2016-03-02 NOTE — Tx Team (Signed)
Interdisciplinary Treatment Plan Update (Adult) Date: 03/02/2016   Date: 03/02/2016 11:40 AM  Progress in Treatment:  Attending groups: Pt is new to milieu, continuing to assess   Participating in groups: Pt is new to milieu, continuing to assess  Taking medication as prescribed: Yes  Tolerating medication: Yes  Family/Significant othe contact made: No, Pt declines Patient understands diagnosis: Yes AEB seeking help for depression Discussing patient identified problems/goals with staff: Yes  Medical problems stabilized or resolved: Yes  Denies suicidal/homicidal ideation: No, recently admitted with sI Patient has not harmed self or Others: Yes   New problem(s) identified: None identified at this time.   Discharge Plan or Barriers: CSW will assess for appropriate discharge plan and relevant barriers.   Additional comments:  Patient and CSW reviewed pt's identified goals and treatment plan. Patient verbalized understanding and agreed to treatment plan. CSW reviewed [facility] "Discharge Process and Patient Involvement" Form. Pt verbalized understanding of information provided and signed form.   Reason for Continuation of Hospitalization:  Anxiety Depression Medication stabilization Suicidal ideation  Estimated length of stay: 3-5 days  Review of initial/current patient goals per problem list:   1.  Goal(s): Patient will participate in aftercare plan  Met:  No  Target date: 3-5 days from date of admission   As evidenced by: Patient will participate within aftercare plan AEB aftercare provider and housing plan at discharge being identified.  03/02/16: CSW to work with Pt to assess for appropriate discharge plan and faciliate appointments and referrals as needed prior to d/c.  2.  Goal (s): Patient will exhibit decreased depressive symptoms and suicidal ideations.  Met:  No  Target date: 3-5 days from date of admission   As evidenced by: Patient will utilize self rating of depression  at 3 or below and demonstrate decreased signs of depression or be deemed stable for discharge by MD. 03/02/16: Pt was admitted with symptoms of depression, rating 10/10. Pt continues to present with flat affect and depressive symptoms.  Pt will demonstrate decreased symptoms of depression and rate depression at 3/10 or lower prior to discharge.  3.  Goal(s): Patient will demonstrate decreased signs and symptoms of anxiety.  Met:  No  Target date: 3-5 days from date of admission   As evidenced by: Patient will utilize self rating of anxiety at 3 or below and demonstrated decreased signs of anxiety, or be deemed stable for discharge by MD 03/02/16: Pt was admitted with increased levels of anxiety and is currently rating those symptoms highly. Pt will demonstrated decreased symptoms of anxiety and rate it at 3/10 prior to d/c.   Attendees:  Patient:    Family:    Physician: Dr. Parke Poisson, MD  03/02/2016 11:40 AM  Nursing: Darrol Angel, RN; Idell Pickles, RN 03/02/2016 11:40 AM  Clinical Social Worker Peri Maris, [place] 03/02/2016 11:40 AM  Other: Tilden Fossa, [place] 03/02/2016 11:40 AM  Clinical:   03/02/2016 11:40 AM  Other: , RN Charge Nurse 03/02/2016 11:40 AM  Other:     Peri Maris, [place] Social Work [phone]

## 2016-03-03 DIAGNOSIS — F319 Bipolar disorder, unspecified: Secondary | ICD-10-CM | POA: Insufficient documentation

## 2016-03-03 NOTE — Progress Notes (Signed)
D:Pt rates depression and anxiety as a 2 on 0-10 scale with 10 being the most. Pt c/o of nauseas this morning and was given Gingerale. Pt's affect is flat with poor eye contact. Pt plans to attend groups today and reports that she slept well. A:Offered support, encouragement and 15 minute checks. R:Pt denies si and hi. Safety maintained on the unit.

## 2016-03-03 NOTE — Progress Notes (Signed)
Recreation Therapy Notes  Date: 05.31.2017 Time: 9:30am Location: [address] Group Room   Group Topic: Stress Management  Goal Area(s) Addresses:  Patient will actively participate in stress management techniques presented during session.   Behavioral Response: Did not attend.   Pratyush Ammon L Denis Carreon, LRT/CTRS        Reyonna Haack L 03/03/2016 2:09 PM 

## 2016-03-03 NOTE — Plan of Care (Signed)
Problem: Medication: Goal: Compliance with prescribed medication regimen will improve Outcome: Progressing Pt has been compliant with medications tonight.    

## 2016-03-03 NOTE — Progress Notes (Addendum)
[facility] MD Progress Note  03/03/2016 6:58 PM SHAYLINN HLADIK  MRN:  [id number] Subjective:  Patient reports improved mood and today minimizes depression, although she does continue to present with a subdued affect. Denies any medication side effects. She is future oriented and focused on her children- states her mother has her children now, and she is unsure how often she will be able to see them . Objective : I have discussed case with treatment team and have met with patient . Patient presents mildly depressed, with constricted but reactive affect. She does state she is feeling better, and denies suicidal ideations. As above, she is future oriented, and is hoping to be able to find out  How often she will be able to see her children. At this time does not endorse any ongoing psychotic symptoms, and does not appear internally preoccupied, or paranoid . Denies medication side effects. Visible in milieu, some group participation, but limited interactions with peers No disruptive or agitated behaviors on unit  Principal Problem: Bipolar affective disorder, current episode severe ([place]) Diagnosis:   Patient Active Problem List   Diagnosis Date Noted  . Bipolar affective disorder, depressed, severe ([place]) [F31.4] 03/01/2016  . Bipolar affective disorder, current episode severe ([place]) [F31.9] 03/01/2016  . Bipolar affective disorder, depressed, severe, with psychotic behavior ([place]) [F31.5] 02/02/2016  . Normal labor [O80, Z37.9] 10/31/2014  . Bipolar disorder ([place]) [F31.9] 10/31/2014  . Cholestasis of pregnancy [O26.619, K83.1] 10/31/2014  . H/O anorexia nervosa [Z86.59] 10/31/2014  . H/O bulimia nervosa [Z86.59] 10/31/2014  . Gestational diabetes [O24.419] 10/31/2014  . Depression--on Latuda [F32.9] 10/31/2014  . Umbilical hernia [J50.0] 10/31/2014  . Hx of preterm delivery  [O09.219] 10/31/2014  . Preterm delivery [O60.10X0] 10/31/2014  . Generalized anxiety disorder [F41.1] 10/31/2014  .  History of marijuana use [Z87.898] 10/31/2014  . Cerebral palsy ([place]) [G80.9] 10/31/2014  . [redacted] weeks gestation of pregnancy [Z3A.35]   . Hx of PTL (preterm labor), current pregnancy [O09.219] 10/30/2014  . Preterm labor [O60.10X0] 10/30/2014  . Back pain complicating pregnancy in third trimester [O26.893, M54.9] 10/14/2014   Total Time spent with patient: 20 minutes    Past Medical History:  Past Medical History  Diagnosis Date  . Cerebral palsy (HCC)     mild, speech impediment  . Anxiety   . Gestational diabetes   . Depression   . Bipolar disorder [facility])     Past Surgical History  Procedure Laterality Date  . Cholecystectomy  2014  . Hernia repair     Family History: History reviewed. No pertinent family history.  Social History:  History  Alcohol Use  . Yes    Comment: ocasionally, not with the pregnancy     History  Drug Use No    Comment: hx of marijuana use 3 weeks ago    Social History   Social History  . Marital Status: Single    Spouse Name: N/A  . Number of Children: N/A  . Years of Education: N/A   Social History Main Topics  . Smoking status: Former Smoker    Quit date: 10/31/2011  . Smokeless tobacco: Never Used  . Alcohol Use: Yes     Comment: ocasionally, not with the pregnancy  . Drug Use: No     Comment: hx of marijuana use 3 weeks ago  . Sexual Activity: Yes    Birth Control/ Protection: None   Other Topics Concern  . None   Social History Narrative   Additional Social History:  Sleep: Good  Appetite:  Good  Current Medications: Current Facility-Administered Medications  Medication Dose Route Frequency Provider Last Rate Last Dose  . acetaminophen (TYLENOL) tablet 650 mg  650 mg Oral Q6H PRN Patrecia Pour, NP      . alum & mag hydroxide-simeth (MAALOX/MYLANTA) 200-200-20 MG/5ML suspension 30 mL  30 mL Oral Q4H PRN Patrecia Pour, NP      . clonazePAM Bobbye [place]) tablet 0.5 mg  0.5 mg Oral TID PRN Jenne Campus, MD   0.5 mg  at 03/02/16 2129  . iloperidone (FANAPT) tablet 2 mg  2 mg Oral BID Jenne Campus, MD   2 mg at 03/03/16 0810  . magnesium hydroxide (MILK OF MAGNESIA) suspension 30 mL  30 mL Oral Daily PRN Patrecia Pour, NP        Lab Results: No results found for this or any previous visit (from the past 48 hour(s)).  Blood Alcohol level:  Lab Results  Component Value Date   ETH <5 02/29/2016   ETH <5 02/02/2016    Physical Findings: AIMS: Facial and Oral Movements Muscles of Facial Expression: None, normal Lips and Perioral Area: None, normal Jaw: None, normal Tongue: None, normal,Extremity Movements Upper (arms, wrists, hands, fingers): None, normal Lower (legs, knees, ankles, toes): None, normal, Trunk Movements Neck, shoulders, hips: None, normal, Overall Severity Severity of abnormal movements (highest score from questions above): None, normal Incapacitation due to abnormal movements: None, normal Patient's awareness of abnormal movements (rate only patient's report): No Awareness, Dental Status Current problems with teeth and/or dentures?: No Does patient usually wear dentures?: No  CIWA:    COWS:     Musculoskeletal: Strength & Muscle Tone: within normal limits Gait & Station: normal Patient leans: N/A  Psychiatric Specialty Exam: Physical Exam  ROS denies headache, denies chest pain, no shortness of breath, no nausea, no vomiting   Blood pressure 87/64, pulse 82, temperature 98.1 F (36.7 C), temperature source Oral, resp. rate 16, height _0  (1.626 m), weight 128 lb (58.06 kg), last menstrual period 02/29/2016, SpO2 100 %, not currently breastfeeding.Body mass index is 21.96 kg/(m^2).  General Appearance: improved grooming   Eye Contact:  Good  Speech:  Normal Rate  Volume:  Normal  Mood:  improved mood, states she feels " better"  Affect:  Appropriate and more reactive   Thought Process:  Linear  Orientation:  Full (Time, Place, and Person)  Thought Content:  no  psychotic symptoms  Suicidal Thoughts:  No- no suicidal ideations, no self injurious ideations, no homicidal or violent ideations   Homicidal Thoughts:  No  Memory:  recent And remote grossly intact   Judgement:  Fair  Insight:  Fair  Psychomotor Activity:  Normal  Concentration:  Concentration: Good and Attention Span: Good  Recall:  Good  Fund of Knowledge:  Good  Language:  Good  Akathisia:  Negative  Handed:  Right  AIMS (if indicated):   no akathisia noted   Assets:  Communication Skills Desire for Improvement Resilience  ADL's:  Intact  Cognition:  WNL  Sleep:  Number of Hours: 4.25  Assessment-  Less depressed, reporting improved mood overall, but still presents with a constricted affect. Tolerating medications well , denies side effects. States " I think Fanapt has been very helpful" No SI at this time, future oriented, ruminative about her children, who are now in custody of her mother. Interested in family meeting with mother, although not sure mother would come.  Treatment Plan Summary: Daily contact with patient to assess and evaluate symptoms and progress in treatment, Medication management, Plan inpatient treatment  and medications as below  Encourage increased group, milieu participation to work on coping skills and symptom reduction Continue Fanapt 2 mgrs BID for mood disorder/psychosis Continue Klonopin 0.5 mgrs TID PRN for anxiety   Neita Garnet, MD 03/03/2016, 6:58 PM

## 2016-03-03 NOTE — Progress Notes (Signed)
D: Pt was in hallway upon initial approach.  Pt presents with depressed, anxious affect and mood.  She reports her day was "okay."  Pt reports her goal today was "to attend group, I made it to some of them."  Pt reports nausea.  Pt reports she did not sleep well last night.  Pt denies SI/HI, denies hallucinations, denies pain.    A: Actively listened to pt and offered support and encouragement.  Ginger ale provided for nausea.  Medication administered per order.  PRN medication administered for anxiety.  PO fluids encouraged and provided.    R: Pt is compliant with medications.  She verbally contracts for safety.  Will continue to monitor and assess.

## 2016-03-03 NOTE — [facility] Group Notes (Signed)
[facility] LCSW Group Therapy 03/03/2016 1:15 PM  Type of Therapy: Group Therapy- Emotion Regulation  Pt did not attend, declined invitation.   Chad CordialLauren Carter, LCSWA 03/03/2016 4:14 PM

## 2016-03-03 NOTE — Progress Notes (Signed)
Adult Psychoeducational Group Note  Date:  03/03/2016 Time:  10:07 PM  Group Topic/Focus:  Wrap-Up Group:   The focus of this group is to help patients review their daily goal of treatment and discuss progress on daily workbooks.  Participation Level:  Active  Participation Quality:  Appropriate  Affect:  Appropriate  Cognitive:  Alert  Insight: Appropriate  Engagement in Group:  Engaged  Modes of Intervention:  Discussion  Additional Comments:  Patient states, "my day was okay". Patient states something positive that happened today was receiving a phone call. Patient goal for today was to talk with doctor about discharge plan.  Liddy Deam L Dhaval Woo 03/03/2016, 10:07 PM

## 2016-03-03 NOTE — [facility] Group Notes (Signed)
[facility] LCSW Aftercare Discharge Planning Group Note  03/03/2016 8:45 AM  Pt did not attend, declined invitation.   [place]iver Ambrosio Carter, LCSWA 03/03/2016 11:03 AM  

## 2016-03-04 NOTE — Progress Notes (Signed)
Patient ID: Caitlin Bell, female   DOB: Jul 10, 1991, 25 y.o.   MRN: [id number] [facility] MD Progress Note  03/04/2016 7:31 PM DRUANNE BOSQUES  MRN:  [id number] Subjective:  Patient reports improving mood and states she feels less depressed. She is future oriented and is wanting to consider going to [facility] on discharge. She denies medication side effects at this time. She seems less ruminative about loss of custody of her children, which she states occurred due to DSS involvement because of children using cannabis . States she would like to have a meeting with mother prior to discharge- mother has custody of children at this time. . Objective : I have discussed case with treatment team and have met with patient . Patient presents with improving mood and improving range of affect. At this time denies suicidal ideations . She is , as noted above, more future oriented, and focused on disposition options, such as going to [facility]. No disruptive or agitated behaviors on unit, going to some groups, pleasant on approach.  Going to some groups  Principal Problem: Bipolar affective disorder, current episode severe ([place]) Diagnosis:   Patient Active Problem List   Diagnosis Date Noted  . Affective psychosis, bipolar ([place]) [F31.9]   . Bipolar affective disorder, depressed, severe ([place]) [F31.4] 03/01/2016  . Bipolar affective disorder, current episode severe ([place]) [F31.9] 03/01/2016  . Bipolar affective disorder, depressed, severe, with psychotic behavior ([place]) [F31.5] 02/02/2016  . Normal labor [O80, Z37.9] 10/31/2014  . Bipolar disorder ([place]) [F31.9] 10/31/2014  . Cholestasis of pregnancy [O26.619, K83.1] 10/31/2014  . H/O anorexia nervosa [Z86.59] 10/31/2014  . H/O bulimia nervosa [Z86.59] 10/31/2014  . Gestational diabetes [O24.419] 10/31/2014  . Depression--on Latuda [F32.9] 10/31/2014  . Umbilical hernia [G28.3] 10/31/2014  . Hx of preterm delivery  [O09.219] 10/31/2014  . Preterm  delivery [O60.10X0] 10/31/2014  . Generalized anxiety disorder [F41.1] 10/31/2014  . History of marijuana use [Z87.898] 10/31/2014  . Cerebral palsy ([place]) [G80.9] 10/31/2014  . [redacted] weeks gestation of pregnancy [Z3A.35]   . Hx of PTL (preterm labor), current pregnancy [O09.219] 10/30/2014  . Preterm labor [O60.10X0] 10/30/2014  . Back pain complicating pregnancy in third trimester [O26.893, M54.9] 10/14/2014   Total Time spent with patient: 20 minutes    Past Medical History:  Past Medical History  Diagnosis Date  . Cerebral palsy (HCC)     mild, speech impediment  . Anxiety   . Gestational diabetes   . Depression   . Bipolar disorder [facility])     Past Surgical History  Procedure Laterality Date  . Cholecystectomy  2014  . Hernia repair     Family History: History reviewed. No pertinent family history.  Social History:  History  Alcohol Use  . Yes    Comment: ocasionally, not with the pregnancy     History  Drug Use No    Comment: hx of marijuana use 3 weeks ago    Social History   Social History  . Marital Status: Single    Spouse Name: N/A  . Number of Children: N/A  . Years of Education: N/A   Social History Main Topics  . Smoking status: Former Smoker    Quit date: 10/31/2011  . Smokeless tobacco: Never Used  . Alcohol Use: Yes     Comment: ocasionally, not with the pregnancy  . Drug Use: No     Comment: hx of marijuana use 3 weeks ago  . Sexual Activity: Yes    Birth Control/  Protection: None   Other Topics Concern  . None   Social History Narrative   Additional Social History:   Sleep:  Fair   Appetite:  Good  Current Medications: Current Facility-Administered Medications  Medication Dose Route Frequency Provider Last Rate Last Dose  . acetaminophen (TYLENOL) tablet 650 mg  650 mg Oral Q6H PRN Patrecia Pour, NP      . alum & mag hydroxide-simeth (MAALOX/MYLANTA) 200-200-20 MG/5ML suspension 30 mL  30 mL Oral Q4H PRN Patrecia Pour, NP       . clonazePAM Bobbye [place]) tablet 0.5 mg  0.5 mg Oral TID PRN Jenne Campus, MD   0.5 mg at 03/04/16 0843  . iloperidone (FANAPT) tablet 2 mg  2 mg Oral BID Jenne Campus, MD   2 mg at 03/04/16 0842  . magnesium hydroxide (MILK OF MAGNESIA) suspension 30 mL  30 mL Oral Daily PRN Patrecia Pour, NP        Lab Results: No results found for this or any previous visit (from the past 48 hour(s)).  Blood Alcohol level:  Lab Results  Component Value Date   ETH <5 02/29/2016   ETH <5 02/02/2016    Physical Findings: AIMS: Facial and Oral Movements Muscles of Facial Expression: None, normal Lips and Perioral Area: None, normal Jaw: None, normal Tongue: None, normal,Extremity Movements Upper (arms, wrists, hands, fingers): None, normal Lower (legs, knees, ankles, toes): None, normal, Trunk Movements Neck, shoulders, hips: None, normal, Overall Severity Severity of abnormal movements (highest score from questions above): None, normal Incapacitation due to abnormal movements: None, normal Patient's awareness of abnormal movements (rate only patient's report): No Awareness, Dental Status Current problems with teeth and/or dentures?: No Does patient usually wear dentures?: No  CIWA:    COWS:     Musculoskeletal: Strength & Muscle Tone: within normal limits Gait & Station: normal Patient leans: N/A  Psychiatric Specialty Exam: Physical Exam  ROS denies headache, denies chest pain, no shortness of breath, no nausea, no vomiting   Blood pressure 94/65, pulse 140, temperature 97.8 F (36.6 C), temperature source Oral, resp. rate 18, height _0  (1.626 m), weight 128 lb (58.06 kg), last menstrual period 02/29/2016, SpO2 100 %, not currently breastfeeding.Body mass index is 21.96 kg/(m^2).  General Appearance: improved grooming   Eye Contact:  Good  Speech:  Normal Rate  Volume:  Normal  Mood: partially improved,less depressed   Affect:  Less constricted, more reactive   Thought  Process:  Linear  Orientation:  Full (Time, Place, and Person)  Thought Content:  no psychotic symptoms  Suicidal Thoughts:  No- no suicidal ideations, no self injurious ideations, no homicidal or violent ideations   Homicidal Thoughts:  No  Memory:  recent And remote grossly intact   Judgement:  Improving   Insight:  Fair  Psychomotor Activity:  Normal  Concentration:  Concentration: Good and Attention Span: Good  Recall:  Good  Fund of Knowledge:  Good  Language:  Good  Akathisia:  Negative  Handed:  Right  AIMS (if indicated):   no akathisia noted   Assets:  Communication Skills Desire for Improvement Resilience  ADL's:  Intact  Cognition:  WNL  Sleep:  Number of Hours: 4.25  Assessment-  Patient is partially improved compared to admission presentation, reports improving mood, and is less ruminative about stressors, more future oriented, wanting to go to [facility] on discharge. At this time tolerating medications well .  Interested in family meeting with mother.  Treatment Plan Summary: Daily contact with patient to assess and evaluate symptoms and progress in treatment, Medication management, Plan inpatient treatment  and medications as below  Encourage increased group, milieu participation to work on coping skills and symptom reduction Continue Fanapt 2 mgrs BID for mood disorder/psychosis Continue Klonopin 0.5 mgrs TID PRN for anxiety  Treatment team working on disposition planning  CSW will contact mother,if patient provides consent, to try to arrange family meeting   Neita Garnet, MD 03/04/2016, 7:31 PM

## 2016-03-04 NOTE — Progress Notes (Signed)
D   Pt is depressed and anxious   She did attend karaoke group and has been interacting appropriately with her peers   She denies suicidal and homicidal ideation    She is compliant with treatment A   Verbal support given   Medications administered and effectiveness monitored     Q 15 min checks R    Pt is safe at present

## 2016-03-04 NOTE — Progress Notes (Signed)
D: Pt has anxious affect and mood.  Pt reports her goal today was "try to go to some groups but I wasn't feeling well."  Reports she was nauseous earlier today.  Pt denies SI/HI, denies hallucinations, reports pain from abdominal cramps of 6/10.  Pt has been visible in milieu interacting with peers and staff appropriately.  Pt attended evening group.  Pt and MHT reported there was a large spider in pt's room tonight.  Pt reported "I'm not going back in there."    A: Actively listened to pt and offered support and encouragement.   Medication administered per order.  PRN medication administered for sleep.  Pt was allowed to sleep in the quiet room tonight.   R: Pt is compliant with medications.  Pt verbally contracts for safety.  Will continue to monitor and assess.

## 2016-03-04 NOTE — [facility] Group Notes (Signed)
[facility] 03/04/2016 1:15pm  Type of Therapy: Mental Health Association Presentation  Pt did not attend, declined invitation.   Chad CordialLauren Carter, LCSWA 03/04/2016 2:09 PM

## 2016-03-04 NOTE — Progress Notes (Signed)
D:Pt  Presents with flat affect and depressed mood. Pt rates depression 8/10. Anxiety 7/10. Pt reports feeling anxious this morning due to a lack of sleep last night. Pt reports that she was unable to sleep after finding a spider in her room. Pt requested Klonopin for anxiety. Pt appears disheveled and have been isolative to her room this morning. Pt denies SI/HI. A: Medications administered as ordered per MD. Verbal support provided. Pt encouraged to attend groups. 15 minute checks performed for safety. R: Pt receptive to tx.

## 2016-03-05 DIAGNOSIS — F3113 Bipolar disorder, current episode manic without psychotic features, severe: Secondary | ICD-10-CM

## 2016-03-05 MED ORDER — TRAZODONE HCL 100 MG PO TABS
100.0000 mg | ORAL_TABLET | Freq: Every evening | ORAL | Status: DC | PRN
  Administered 2016-03-05 (×2): 100 mg via ORAL
  Filled 2016-03-05 (×2): qty 1

## 2016-03-05 MED ORDER — GABAPENTIN 100 MG PO CAPS
100.0000 mg | ORAL_CAPSULE | Freq: Three times a day (TID) | ORAL | Status: DC
  Administered 2016-03-05 – 2016-03-06 (×3): 100 mg via ORAL
  Filled 2016-03-05 (×9): qty 1

## 2016-03-05 NOTE — Progress Notes (Signed)
D: Patient denies SI/HI and A/V hallucinations  A: Monitored q 15 minutes; patient encouraged to attend groups; patient educated about medications; patient given medications per physician orders; patient encouraged to express feelings and/or concerns  R: Patient is cooperative and pleasant; patient is appropriate to circumstances; -patient's interaction with staff and peers is assertive; patient was able to set goal to talk with staff 1:1 when having feelings of SI; patient is taking medications as prescribed and tolerating medications; patient is attending all groups

## 2016-03-05 NOTE — Progress Notes (Signed)
Patient ID: Caitlin Bell, female   DOB: 1991-06-06, 25 y.o.   MRN: [id number] [facility] MD Progress Note  03/05/2016 11:36 AM Caitlin Bell  MRN:  [id number] Subjective:  Patient reports improving mood and less depressed.  She was seen feeling out forms to get into [place].  She states that although she is depressed about losing custody of her children, she knew it was for the best.   She denies medication side effects at this time.  She is wanting to be on another anxiolytic as Klonopin is not allowed at the [facility].    Objective : I have discussed case with treatment team and have met with patient . Patient presents with improving mood and improving range of affect. At this time denies suicidal ideations.  She is goal oriented.   She is more future oriented, and focused on disposition options, such as going to [facility] or [facility] She is pleasant and per nursing No disruptive or agitated behaviors on unit.  She is not as isolative and attending groups.    Attending groups.   Principal Problem: Bipolar affective disorder, current episode severe ([place]) Diagnosis:   Patient Active Problem List   Diagnosis Date Noted  . Affective psychosis, bipolar ([place]) [F31.9]   . Bipolar affective disorder, depressed, severe ([place]) [F31.4] 03/01/2016  . Bipolar affective disorder, current episode severe ([place]) [F31.9] 03/01/2016  . Bipolar affective disorder, depressed, severe, with psychotic behavior ([place]) [F31.5] 02/02/2016  . Normal labor [O80, Z37.9] 10/31/2014  . Bipolar disorder ([place]) [F31.9] 10/31/2014  . Cholestasis of pregnancy [O26.619, K83.1] 10/31/2014  . H/O anorexia nervosa [Z86.59] 10/31/2014  . H/O bulimia nervosa [Z86.59] 10/31/2014  . Gestational diabetes [O24.419] 10/31/2014  . Depression--on Latuda [F32.9] 10/31/2014  . Umbilical hernia [A30.9] 10/31/2014  . Hx of preterm delivery  [O09.219] 10/31/2014  . Preterm delivery [O60.10X0] 10/31/2014  . Generalized anxiety  disorder [F41.1] 10/31/2014  . History of marijuana use [Z87.898] 10/31/2014  . Cerebral palsy ([place]) [G80.9] 10/31/2014  . [redacted] weeks gestation of pregnancy [Z3A.35]   . Hx of PTL (preterm labor), current pregnancy [O09.219] 10/30/2014  . Preterm labor [O60.10X0] 10/30/2014  . Back pain complicating pregnancy in third trimester [O26.893, M54.9] 10/14/2014   Total Time spent with patient: 20 minutes  Past Medical History:  Past Medical History  Diagnosis Date  . Cerebral palsy (HCC)     mild, speech impediment  . Anxiety   . Gestational diabetes   . Depression   . Bipolar disorder [facility])     Past Surgical History  Procedure Laterality Date  . Cholecystectomy  2014  . Hernia repair     Family History: History reviewed. No pertinent family history.  Social History:  History  Alcohol Use  . Yes    Comment: ocasionally, not with the pregnancy     History  Drug Use No    Comment: hx of marijuana use 3 weeks ago    Social History   Social History  . Marital Status: Single    Spouse Name: N/A  . Number of Children: N/A  . Years of Education: N/A   Social History Main Topics  . Smoking status: Former Smoker    Quit date: 10/31/2011  . Smokeless tobacco: Never Used  . Alcohol Use: Yes     Comment: ocasionally, not with the pregnancy  . Drug Use: No     Comment: hx of marijuana use 3 weeks ago  . Sexual Activity: Yes  Birth Control/ Protection: None   Other Topics Concern  . None   Social History Narrative   Additional Social History:   Sleep:  Fair   Appetite:  Good  Current Medications: Current Facility-Administered Medications  Medication Dose Route Frequency Provider Last Rate Last Dose  . acetaminophen (TYLENOL) tablet 650 mg  650 mg Oral Q6H PRN Patrecia Pour, NP      . alum & mag hydroxide-simeth (MAALOX/MYLANTA) 200-200-20 MG/5ML suspension 30 mL  30 mL Oral Q4H PRN Patrecia Pour, NP      . clonazePAM Bobbye [place]) tablet 0.5 mg  0.5 mg Oral TID PRN  Jenne Campus, MD   0.5 mg at 03/04/16 2216  . gabapentin (NEURONTIN) capsule 100 mg  100 mg Oral TID Kerrie Buffalo, NP      . iloperidone (FANAPT) tablet 2 mg  2 mg Oral BID Jenne Campus, MD   2 mg at 03/05/16 6759  . magnesium hydroxide (MILK OF MAGNESIA) suspension 30 mL  30 mL Oral Daily PRN Patrecia Pour, NP      . traZODone (DESYREL) tablet 100 mg  100 mg Oral QHS PRN Lurena Nida, NP   100 mg at 03/05/16 0036    Lab Results: No results found for this or any previous visit (from the past 43 hour(s)).  Blood Alcohol level:  Lab Results  Component Value Date   ETH <5 02/29/2016   ETH <5 02/02/2016    Physical Findings: AIMS: Facial and Oral Movements Muscles of Facial Expression: None, normal Lips and Perioral Area: None, normal Jaw: None, normal Tongue: None, normal,Extremity Movements Upper (arms, wrists, hands, fingers): None, normal Lower (legs, knees, ankles, toes): None, normal, Trunk Movements Neck, shoulders, hips: None, normal, Overall Severity Severity of abnormal movements (highest score from questions above): None, normal Incapacitation due to abnormal movements: None, normal Patient's awareness of abnormal movements (rate only patient's report): No Awareness, Dental Status Current problems with teeth and/or dentures?: No Does patient usually wear dentures?: No  CIWA:    COWS:     Musculoskeletal: Strength & Muscle Tone: within normal limits Gait & Station: normal Patient leans: N/A  Psychiatric Specialty Exam: Physical Exam  Psychiatric: Her mood appears anxious. Thought content is not paranoid. Depressed: improving. She expresses no homicidal and no suicidal ideation.    Review of Systems  Psychiatric/Behavioral: Negative for suicidal ideas and hallucinations. The patient is nervous/anxious.   All other systems reviewed and are negative.  denies headache, denies chest pain, no shortness of breath, no nausea, no vomiting   Blood pressure  125/83, pulse 88, temperature 97.8 F (36.6 C), temperature source Oral, resp. rate 18, height 5' 4"  (1.626 m), weight 58.06 kg (128 lb), last menstrual period 02/29/2016, SpO2 100 %, not currently breastfeeding.Body mass index is 21.96 kg/(m^2).  General Appearance: improved grooming   Eye Contact:  Good  Speech:  Normal Rate  Volume:  Normal  Mood: partially improved,less depressed   Affect:  Less constricted, more reactive, expressive  Thought Process:  Linear  Orientation:  Full (Time, Place, and Person)  Thought Content:  no psychotic symptoms  Suicidal Thoughts:  No- no suicidal ideations  Homicidal Thoughts:  No  Memory:  recent And remote grossly intact   Judgement:  Improving   Insight:  Fair  Psychomotor Activity:  Normal  Concentration:  Concentration: Good and Attention Span: Good  Recall:  Good  Fund of Knowledge:  Good  Language:  Good  Akathisia:  Negative  Handed:  Right  AIMS (if indicated):   no akathisia noted   Assets:  Communication Skills Desire for Improvement Resilience  ADL's:  Intact  Cognition:  WNL  Sleep:  Number of Hours: 4.5  Assessment-  Patient is partially improved compared to admission presentation, reports improving mood, and is less ruminative about stressors, more future oriented, wanting to go to [facility] or [facility] upon discharge. At this time tolerating medications well.  Treatment Plan Summary: Daily contact with patient to assess and evaluate symptoms and progress in treatment, Medication management, Plan inpatient treatment  and medications as below  Encourage increased group, milieu participation to work on coping skills and symptom reduction Continue Fanapt 2 mgrs BID for mood disorder/psychosis Continue Klonopin 0.5 mgrs TID PRN for anxiety  Added Gabapentin 100 mg TID anxiety Treatment team working on disposition planning  CSW will contact mother,if patient provides consent, to try to arrange family meeting   Akina Maish May  Linnette Panella, NP [facility] 03/05/2016, 11:36 AM

## 2016-03-05 NOTE — Tx Team (Signed)
Interdisciplinary Treatment Plan Update (Adult) Date: 03/05/2016   Date: 03/05/2016 4:01 PM  Progress in Treatment:  Attending groups: Yes   Participating in groups: Yes Taking medication as prescribed: Yes  Tolerating medication: Yes  Family/Significant othe contact made: No, Pt declines Patient understands diagnosis: Yes AEB seeking help for depression Discussing patient identified problems/goals with staff: Yes  Medical problems stabilized or resolved: Yes  Denies suicidal/homicidal ideation: Yes Patient has not harmed self or Others: Yes   New problem(s) identified: None identified at this time.   Discharge Plan or Barriers: Pt will discharge home and follow-up with outpatient resources.   Additional comments:  Patient and CSW reviewed pt's identified goals and treatment plan. Patient verbalized understanding and agreed to treatment plan. CSW reviewed [facility] "Discharge Process and Patient Involvement" Form. Pt verbalized understanding of information provided and signed form.   Reason for Continuation of Hospitalization:  Anxiety Depression Medication stabilization Suicidal ideation  Estimated length of stay: 1 day  Review of initial/current patient goals per problem list:   1.  Goal(s): Patient will participate in aftercare plan  Met:  Yes  Target date: 3-5 days from date of admission   As evidenced by: Patient will participate within aftercare plan AEB aftercare provider and housing plan at discharge being identified.  03/02/16: CSW to work with Pt to assess for appropriate discharge plan and faciliate appointments and referrals as needed prior to d/c. 03/05/16: Pt will discharge home and follow-up with outpatient resources.  2.  Goal (s): Patient will exhibit decreased depressive symptoms and suicidal ideations.  Met:  Yes  Target date: 3-5 days from date of admission   As evidenced by: Patient will utilize self rating of depression at 3 or below and demonstrate decreased  signs of depression or be deemed stable for discharge by MD. 03/02/16: Pt was admitted with symptoms of depression, rating 10/10. Pt continues to present with flat affect and depressive symptoms.  Pt will demonstrate decreased symptoms of depression and rate depression at 3/10 or lower prior to discharge. 03/05/2016: Pt rates depression at 0/10; denies SI  3.  Goal(s): Patient will demonstrate decreased signs and symptoms of anxiety.  Met:  Adequate for DC  Target date: 3-5 days from date of admission   As evidenced by: Patient will utilize self rating of anxiety at 3 or below and demonstrated decreased signs of anxiety, or be deemed stable for discharge by MD 03/02/16: Pt was admitted with increased levels of anxiety and is currently rating those symptoms highly. Pt will demonstrated decreased symptoms of anxiety and rate it at 3/10 prior to d/c. 03/05/2016: MD feels that Pt's symptoms have decreased to the point that they can be managed in an outpatient setting.   Attendees:  Patient:    Family:    Physician: Dr. Shea Evans, MD  03/05/2016 4:01 PM  Nursing: Sandre Kitty, RN; Marilynne Halsted, RN 03/05/2016 4:01 PM  Clinical Social Worker Peri Maris, [place] 03/05/2016 4:01 PM  Other: Tilden Fossa, [place] 03/05/2016 4:01 PM  Clinical:   03/05/2016 4:01 PM  Other: , RN Charge Nurse 03/05/2016 4:01 PM  Other:     Peri Maris, [place] Social Work [phone]

## 2016-03-05 NOTE — Progress Notes (Signed)
Adult Psychoeducational Group Note  Date:  03/05/2016 Time:  11:47 PM  Group Topic/Focus:  Wrap-Up Group:   The focus of this group is to help patients review their daily goal of treatment and discuss progress on daily workbooks.  Participation Level:  Active  Participation Quality:  Appropriate  Affect:  Appropriate  Cognitive:  Alert  Insight: Appropriate  Engagement in Group:  Engaged  Modes of Intervention:  Activity  Additional Comments:  Patient rated her day a 6. Was ready to go home but having high anxiety and says she wants to stay a few more days.  Natasha MeadKiara M Laronda Lisby 03/05/2016, 11:47 PM

## 2016-03-05 NOTE — Progress Notes (Signed)
Patient ID: Margrett RudVictoria L Senat, female   DOB: 01/18/1991, 25 y.o.   MRN: [id number] D: Client visible on the unit, seen in dayroom interacting with peers. Client reports "I want to stay til Monday, since I been put on that new medicine, want to see how it's going to work" Client denies depression, but asking for anxiety medication. "I been using coping skills, like deep breathing, coloring, and talking to people" A: Writer provided emotional support, reviewed medication, administered as ordered. Client encouraged to review medication with physician and report any side effects. Staff will monitor q3615min for safety. R: Client is safe on the unit, attended group.

## 2016-03-05 NOTE — [facility] Group Notes (Signed)
[facility] LCSW Group Therapy 03/05/2016 1:15pm  Type of Therapy: Group Therapy- Feelings Around Relapse and Recovery  Participation Level: Active   Participation Quality:  Appropriate  Affect:  Appropriate  Cognitive: Alert and Oriented   Insight:  Developing   Engagement in Therapy: Developing/Improving and Engaged   Modes of Intervention: Clarification, Confrontation, Discussion, Education, Exploration, Limit-setting, Orientation, Problem-solving, Rapport Building, Dance movement psychotherapisteality Testing, Socialization and Support  Summary of Progress/Problems: The topic for today was feelings about relapse. The group discussed what relapse prevention is to them and identified triggers that they are on the path to relapse. Members also processed their feeling towards relapse and were able to relate to common experiences. Group also discussed coping skills that can be used for relapse prevention.  Pt was able to identify the loss of custody of her children as the trigger for the most recent relapse. Pt expressed that she feels that this relapse has been more severe but being in the hospital has allowed her to accept some of her circumstances and problem solve regarding her next steps.    Therapeutic Modalities:   Cognitive Behavioral Therapy Solution-Focused Therapy Assertiveness Training Relapse Prevention Therapy    Caitlin SprinklesLauren Bell, LCSWA [phone] 03/05/2016 4:13 PM

## 2016-03-06 ENCOUNTER — Encounter (HOSPITAL_COMMUNITY): Payer: Self-pay | Admitting: Registered Nurse

## 2016-03-06 MED ORDER — TRAZODONE HCL 100 MG PO TABS: 100.0000 mg | ORAL_TABLET | Freq: Every evening | ORAL | Status: DC | PRN

## 2016-03-06 MED ORDER — ILOPERIDONE 4 MG PO TABS: 2.0000 mg | ORAL_TABLET | Freq: Two times a day (BID) | ORAL | Status: DC

## 2016-03-06 MED ORDER — CLONAZEPAM 0.5 MG PO TABS: 0.5000 mg | ORAL_TABLET | Freq: Three times a day (TID) | ORAL | Status: DC | PRN

## 2016-03-06 MED ORDER — GABAPENTIN 100 MG PO CAPS: 100.0000 mg | ORAL_CAPSULE | Freq: Three times a day (TID) | ORAL | Status: DC

## 2016-03-06 MED ORDER — GABAPENTIN 100 MG PO CAPS
200.0000 mg | ORAL_CAPSULE | Freq: Three times a day (TID) | ORAL | Status: DC
  Administered 2016-03-07 – 2016-03-08 (×5): 200 mg via ORAL
  Filled 2016-03-06 (×11): qty 2

## 2016-03-06 MED ORDER — TRAZODONE HCL 50 MG PO TABS: 50.0000 mg | ORAL_TABLET | Freq: Every evening | ORAL | Status: DC | PRN

## 2016-03-06 NOTE — Progress Notes (Addendum)
D: Patient denies SI/HI and A/V hallucinations; patient reports that she is  Not ready to go home today; patient reporting some lightheadedness and dizziness; patient also reporting anxiety due to thinking about what she is going to do when she goes home  A: Monitored q 15 minutes; patient encouraged to attend groups; patient educated about medications; patient given medications per physician orders; patient encouraged to express feelings and/or concerns; vital signs assessed ( see flowsheet) and reported to NP S. Rankin; fluids given and educated patient on deep breathing exercises to help reduce anxiety  R: Patient reports relief of anxiety and dizziness;vital signs improved ( see flowsheet) patient educated about changing positions slowly and it was also demonstrated for the patient; patient's interaction with staff and peers is is appropriate; patient was able to set goal to talk with staff 1:1 when having feelings of SI; patient is taking medications as prescribed and tolerating medications; patient is attending most groups

## 2016-03-06 NOTE — Progress Notes (Signed)
[facility] MD Progress Note  03/06/2016 11:00 AM Caitlin Bell  MRN:  [id number] Subjective:  I don't think I'm ready for discharge.  I still feel very anxious, sad, hopeless and helpless.  I think my medicine needs to be increased. . Objective : Patient seen chart reviewed.  Patient supposed to be discharged today but she is feeling very anxious depressed and sad.  She feels hopeless and helpless.  She does not feel safe to be discharged.  She continues to have racing thoughts, ruminations and worthless.  She was started, pending yesterday.  She is tolerating other than she has some time nausea.  She like to increase her gabapentin .  She admitted poor sleep.  She is very isolated withdrawn but there has been no disruptive for agitation.  She has no tremors or shakes.  The patient denies any active suicidal thoughts but continues to have fleeting and passive suicidal thoughts with no plan.   Principal Problem: Bipolar affective disorder, current episode severe (HCC) Diagnosis:   Patient Active Problem List   Diagnosis Date Noted  . Affective psychosis, bipolar (HCC) [F31.9]   . Bipolar affective disorder, depressed, severe (HCC) [F31.4] 03/01/2016  . Bipolar affective disorder, current episode severe (HCC) [F31.9] 03/01/2016  . Bipolar affective disorder, depressed, severe, with psychotic behavior (HCC) [F31.5] 02/02/2016  . Normal labor [O80, Z37.9] 10/31/2014  . Bipolar disorder (HCC) [F31.9] 10/31/2014  . Cholestasis of pregnancy [O26.619, K83.1] 10/31/2014  . H/O anorexia nervosa [Z86.59] 10/31/2014  . H/O bulimia nervosa [Z86.59] 10/31/2014  . Gestational diabetes [O24.419] 10/31/2014  . Depression--on Latuda [F32.9] 10/31/2014  . Umbilical hernia [K42.9] 10/31/2014  . Hx of preterm delivery  [O09.219] 10/31/2014  . Preterm delivery [O60.10X0] 10/31/2014  . Generalized anxiety disorder [F41.1] 10/31/2014  . History of marijuana use [Z87.898] 10/31/2014  . Cerebral palsy (HCC) [G80.9]  10/31/2014  . [redacted] weeks gestation of pregnancy [Z3A.35]   . Hx of PTL (preterm labor), current pregnancy [O09.219] 10/30/2014  . Preterm labor [O60.10X0] 10/30/2014  . Back pain complicating pregnancy in third trimester [O26.893, M54.9] 10/14/2014   Total Time spent with patient: 20 minutes    Past Medical History:  Past Medical History  Diagnosis Date  . Cerebral palsy (HCC)     mild, speech impediment  . Anxiety   . Gestational diabetes   . Depression   . Bipolar disorder [facility])     Past Surgical History  Procedure Laterality Date  . Cholecystectomy  2014  . Hernia repair     Family History: History reviewed. No pertinent family history.  Social History:  History  Alcohol Use  . Yes    Comment: ocasionally, not with the pregnancy     History  Drug Use No    Comment: hx of marijuana use 3 weeks ago    Social History   Social History  . Marital Status: Single    Spouse Name: N/A  . Number of Children: N/A  . Years of Education: N/A   Social History Main Topics  . Smoking status: Former Smoker    Quit date: 10/31/2011  . Smokeless tobacco: Never Used  . Alcohol Use: Yes     Comment: ocasionally, not with the pregnancy  . Drug Use: No     Comment: hx of marijuana use 3 weeks ago  . Sexual Activity: Yes    Birth Control/ Protection: None   Other Topics Concern  . None   Social History Narrative   Additional Social History:  Sleep:  Fair   Appetite:  Good  Current Medications: Current Facility-Administered Medications  Medication Dose Route Frequency Provider Last Rate Last Dose  . acetaminophen (TYLENOL) tablet 650 mg  650 mg Oral Q6H PRN Charm Rings, NP      . alum & mag hydroxide-simeth (MAALOX/MYLANTA) 200-200-20 MG/5ML suspension 30 mL  30 mL Oral Q4H PRN Charm Rings, NP      . clonazePAM [name]) tablet 0.5 mg  0.5 mg Oral TID PRN Craige Cotta, MD   0.5 mg at 03/05/16 2143  . gabapentin (NEURONTIN) capsule 100 mg  100 mg Oral TID  Adonis Brook, NP   100 mg at 03/06/16 0752  . iloperidone (FANAPT) tablet 2 mg  2 mg Oral BID Craige Cotta, MD   2 mg at 03/06/16 0752  . magnesium hydroxide (MILK OF MAGNESIA) suspension 30 mL  30 mL Oral Daily PRN Charm Rings, NP      . traZODone (DESYREL) tablet 100 mg  100 mg Oral QHS PRN Kristeen Mans, NP   100 mg at 03/05/16 2143    Lab Results: No results found for this or any previous visit (from the past 48 hour(s)).  Blood Alcohol level:  Lab Results  Component Value Date   ETH <5 02/29/2016   ETH <5 02/02/2016    Physical Findings: AIMS: Facial and Oral Movements Muscles of Facial Expression: None, normal Lips and Perioral Area: None, normal Jaw: None, normal Tongue: None, normal,Extremity Movements Upper (arms, wrists, hands, fingers): None, normal Lower (legs, knees, ankles, toes): None, normal, Trunk Movements Neck, shoulders, hips: None, normal, Overall Severity Severity of abnormal movements (highest score from questions above): None, normal Incapacitation due to abnormal movements: None, normal Patient's awareness of abnormal movements (rate only patient's report): No Awareness, Dental Status Current problems with teeth and/or dentures?: No Does patient usually wear dentures?: No  CIWA:    COWS:     Musculoskeletal: Strength & Muscle Tone: within normal limits Gait & Station: normal Patient leans: N/A  Psychiatric Specialty Exam: Physical Exam  ROS denies headache, denies chest pain, no shortness of breath, no nausea, no vomiting   Blood pressure 125/83, pulse 88, temperature 97.8 F (36.6 C), temperature source Oral, resp. rate 18, height 5\' 4"  (1.626 m), weight 58.06 kg (128 lb), last menstrual period 02/29/2016, SpO2 100 %, not currently breastfeeding.Body mass index is 21.96 kg/(m^2).  General Appearance: Disheveled  Eye Contact:  Fair  Speech:  Slow  Volume:  Decreased  Mood: Sad and depressed   Affect: Constricted   Thought Process:   Linear  Orientation:  Full (Time, Place, and Person)  Thought Content:  no psychotic symptoms  Suicidal Thoughts:  No active suicidal thoughts but continues to have fleeting and passive suicidal thoughts.      Homicidal Thoughts:  No  Memory:  recent And remote grossly intact   Judgement:  Fair   Insight:  Fair  Psychomotor Activity:  Normal  Concentration:  Concentration: Fair and Attention Span: Fair  Recall:  Good  Fund of Knowledge:  Good  Language:  Good  Akathisia:  Negative  Handed:  Right  AIMS (if indicated):   no akathisia noted   Assets:  Communication Skills Desire for Improvement Resilience  ADL's:  Intact  Cognition:  WNL  Sleep:  Number of Hours: 6.75  Assessment-  Patient Shows symptoms of depression, anxiety and feeling of hopelessness and worthlessness.  She like to increase gabapentin.  She continues  to have ruminations and depressive thoughts.     Treatment Plan Summary: Daily contact with patient to assess and evaluate symptoms and progress in treatment, Medication management, Plan inpatient treatment  and medications as below  We will for discharge as patient is not ready at this time.  Increase gabapentin 200 mg 3 times a day, discussed medication side effects especially sedation nausea and drowsiness. Encourage increased group, milieu participation to work on coping skills and symptom reduction Blood work, her dependency test and UDS is negative, her basic chemistry is normal .   Continue Fanapt 2 mgrs BID for mood disorder/psychosis Continue Klonopin 0.5 mgrs TID PRN for anxiety  Treatment team working on disposition planning  CSW will contact mother,if patient provides consent, to try to arrange family meeting   Joanmarie Tsang T., MD 03/06/2016, 11:00 AM

## 2016-03-06 NOTE — Progress Notes (Signed)
Patient  Refused morning vital signs

## 2016-03-06 NOTE — Progress Notes (Signed)
Patient observed up in the dayroom laughing and talking with select peers. Writer spoke with her 1:1 and she reports that her bp was very low this morning so she has been making sure she keeps herself hydrated today. She reports that she thought she was ready for discharge but after the issue with her bp she felt better if she stayed to make sure it was ok. [facility] [place] [facility] [place]. She denies si/hi/a/ hallucinations. Support given and safety maintained on unit with 15 min checks.

## 2016-03-06 NOTE — Discharge Summary (Signed)
Physician Discharge Summary Note  Patient:  Caitlin Bell is an 25 y.o., female MRN:  [id number] DOB:  12/01/1990 Patient phone:  [phone] (home)  Patient address:   [address] [place] [address],  Total Time spent with patient: 30 minutes  Date of Admission:  03/01/2016 Date of Discharge: 03/06/16  Reason for Admission:  Worsening depression and suicidal thoughts with plan of overdosing on prescribed medications  Principal Problem: Bipolar affective disorder, current episode severe [facility](HCC) Discharge Diagnoses: Patient Active Problem List   Diagnosis Date Noted  . Affective psychosis, bipolar (HCC) [F31.9]   . Bipolar affective disorder, depressed, severe (HCC) [F31.4] 03/01/2016  . Bipolar affective disorder, current episode severe (HCC) [F31.9] 03/01/2016  . Bipolar affective disorder, depressed, severe, with psychotic behavior (HCC) [F31.5] 02/02/2016  . Normal labor [O80, Z37.9] 10/31/2014  . Bipolar disorder (HCC) [F31.9] 10/31/2014  . Cholestasis of pregnancy [O26.619, K83.1] 10/31/2014  . H/O anorexia nervosa [Z86.59] 10/31/2014  . H/O bulimia nervosa [Z86.59] 10/31/2014  . Gestational diabetes [O24.419] 10/31/2014  . Depression--on Latuda [F32.9] 10/31/2014  . Umbilical hernia [K42.9] 10/31/2014  . Hx of preterm delivery  [O09.219] 10/31/2014  . Preterm delivery [O60.10X0] 10/31/2014  . Generalized anxiety disorder [F41.1] 10/31/2014  . History of marijuana use [Z87.898] 10/31/2014  . Cerebral palsy (HCC) [G80.9] 10/31/2014  . [redacted] weeks gestation of pregnancy [Z3A.35]   . Hx of PTL (preterm labor), current pregnancy [O09.219] 10/30/2014  . Preterm labor [O60.10X0] 10/30/2014  . Back pain complicating pregnancy in third trimester [O26.893, M54.9] 10/14/2014    Past Psychiatric History:  Several psychiatric admissions for mood disorder and anxiety b ( [facility] for similar symptoms- depression ).  Reports history of Bipolar Disorder  with episodes of hypomania . History of self cutting , but stopped about one year ago.  Also reported history of panic attacks and excessive worrying, anxiety, mainly related to her children.   Past Medical History:  Past Medical History  Diagnosis Date  . Cerebral palsy (HCC)     mild, speech impediment  . Anxiety   . Gestational diabetes   . Depression   . Bipolar disorder [facility](HCC)     Past Surgical History  Procedure Laterality Date  . Cholecystectomy  2014  . Hernia repair     Family History: History reviewed. No pertinent family history. Family Psychiatric  History: Sister has history of Bipolar Disorder , states someone in her father's family committed suicide but is not sure of who, states there is a history of substance abuse in her father's extended  Social History:  History  Alcohol Use  . Yes    Comment: ocasionally, not with the pregnancy     History  Drug Use No    Comment: hx of marijuana use 3 weeks ago    Social History   Social History  . Marital Status: Single    Spouse Name: N/A  . Number of Children: N/A  . Years of Education: N/A   Social History Main Topics  . Smoking status: Former Smoker    Quit date: 10/31/2011  . Smokeless tobacco: Never Used  . Alcohol Use: Yes     Comment: ocasionally, not with the pregnancy  . Drug Use: No     Comment: hx of marijuana use 3 weeks ago  . Sexual Activity: Yes    Birth Control/ Protection: None   Other Topics Concern  . None   [facility]    Hospital  Course:  Caitlin Bell was admitted for Bipolar affective disorder, current episode severe (HCC) and crisis management.  She was treated with the following medications Klonopin for anxiety/insomnia; Neurontin for agitation; Fanapt for psychosis; and Trazodone for insomnia.  Caitlin Bell was discharged with current medication and was instructed on how to take medications as prescribed; (details listed below under Medication List).   Medical problems were identified and treated as needed.  Home medications were restarted as appropriate.  Improvement was monitored by observation and Caitlin Bell daily report of symptom reduction.  Emotional and mental status was monitored by daily self-inventory reports completed by Caitlin Bell and clinical staff.         Caitlin Bell was evaluated by the treatment team for stability and plans for continued recovery upon discharge.  Caitlin Bell motivation was an integral factor for scheduling further treatment.  Employment, transportation, bed availability, health status, family support, and any pending legal issues were also considered during her hospital stay.  She was offered further treatment options upon discharge including but not limited to Residential, Intensive Outpatient, and Outpatient treatment.  Caitlin Bell will follow up with the services as listed below under Follow Up Information.     Upon completion of this admission the Caitlin Bell was both mentally and medically stable for discharge denying suicidal/homicidal ideation, auditory/visual/tactile hallucinations, delusional thoughts and paranoia.     Physical Findings: AIMS: Facial and Oral Movements Muscles of Facial Expression: None, normal Lips and Perioral Area: None, normal Jaw: None, normal Tongue: None, normal,Extremity Movements Upper (arms, wrists, hands, fingers): None, normal Lower (legs, knees, ankles, toes): None, normal, Trunk Movements Neck, shoulders, hips: None, normal, Overall Severity Severity of abnormal movements (highest score from questions above): None, normal Incapacitation due to abnormal movements: None, normal Patient's awareness of abnormal movements (rate only patient's report): No Awareness, Dental Status Current problems with teeth and/or dentures?: No Does patient usually wear dentures?: No  CIWA:    COWS:     Musculoskeletal: Strength & Muscle Tone:  within normal limits Gait & Station: normal Patient leans: N/A  Psychiatric Specialty Exam: See Suicide Risk Assessment Physical Exam  Nursing note and vitals reviewed. Psychiatric: Her mood appears anxious. Thought content is not paranoid and not delusional. She does not exhibit a depressed mood. She expresses no homicidal and no suicidal ideation.    Review of Systems  Psychiatric/Behavioral: Negative for suicidal ideas and hallucinations (Stable). Depression: Stable. Nervous/anxious: Stable.   All other systems reviewed and are negative.   Blood pressure 99/62, pulse 130, temperature 98.3 F (36.8 C), temperature source Oral, resp. rate 16, height 5\' 4"  (1.626 m), weight 58.06 kg (128 lb), last menstrual period 02/29/2016, SpO2 100 %, not currently breastfeeding.Body mass index is 21.96 kg/(m^2).    Have you used any form of tobacco in the last 30 days? (Cigarettes, Smokeless Tobacco, Cigars, and/or Pipes): Yes  Has this patient used any form of tobacco in the last 30 days? (Cigarettes, Smokeless Tobacco, Cigars, and/or Pipes) Yes, No  Blood Alcohol level:  Lab Results  Component Value Date   [facility] <5 02/29/2016   ETH <5 02/02/2016    Metabolic Disorder Labs:  Lab Results  Component Value Date   HGBA1C 5.6 02/04/2016   MPG 114 02/04/2016   Lab Results  Component Value Date   PROLACTIN 32.8* 02/04/2016   Lab Results  Component Value Date   CHOL 139 02/04/2016   TRIG 86 02/04/2016  HDL 53 02/04/2016   CHOLHDL 2.6 02/04/2016   VLDL 17 02/04/2016   LDLCALC 69 02/04/2016    See Psychiatric Specialty Exam and Suicide Risk Assessment completed by Attending Physician prior to discharge.  Discharge destination:  Home  Is patient on multiple antipsychotic therapies at discharge:  No   Has Patient had three or more failed trials of antipsychotic monotherapy by history:  No  Recommended Plan for Multiple Antipsychotic Therapies: NA      Discharge Instructions     Activity as tolerated - No restrictions    Complete by:  As directed      Diet general    Complete by:  As directed      Discharge instructions    Complete by:  As directed   Take all of you medications as prescribed by your mental healthcare provider.  Report any adverse effects and reactions from your medications to your outpatient provider promptly.  Do not engage in alcohol and or illegal drug use while on prescription medicines. Keep all scheduled appointments. This is to ensure that you are getting refills on time and to avoid any interruption in your medication.  If you are unable to keep an appointment call to reschedule.  Be sure to follow up with resources and follow ups given. In the event of worsening symptoms call the crisis hotline, 911, and or go to the nearest emergency department for appropriate evaluation and treatment of symptoms. Follow-up with your primary care provider for your medical issues, concerns and or health care needs.            Medication List    STOP taking these medications        hydrOXYzine 25 MG tablet  Commonly known as:  ATARAX/VISTARIL      TAKE these medications      Indication   clonazePAM 0.5 MG tablet  Commonly known as:  KLONOPIN  Take 1 tablet (0.5 mg total) by mouth 3 (three) times daily as needed (anxiety/ insomnia).   Indication:  Anxiety     gabapentin 100 MG capsule  Commonly known as:  NEURONTIN  Take 2 capsules (200 mg total) by mouth 3 (three) times daily.   Indication:  Agitation, anxiety     iloperidone 4 MG Tabs tablet  Commonly known as:  FANAPT  Take 0.5 tablets (2 mg total) by mouth 2 (two) times daily.   Indication:  Schizophrenia     traZODone 100 MG tablet  Commonly known as:  DESYREL  Take 1 tablet (100 mg total) by mouth at bedtime as needed for sleep.   Indication:  Trouble Sleeping       Follow-up Information    Follow up with [facility].   Why:  Please walk-in between 8am-12pm  Monday-Friday to be established for services if [facility] is unable to schedule.   Contact information:   [address] [place], [place] [address] Phone: [phone] Fax: [phone]      Follow up with [facility].   Why:  CSW was unable to make appointment with staff. Please call on Monday to schedule your next appointment.   Contact information:   [address] [place] [address] [phone]       Follow-up recommendations:  Activity:  As tolerated Diet:  As tolerated  Comments:  Caitlin Bell has been instructed to take medications as prescribed; and report adverse effects to outpatient provider.  Follow up with primary doctor for any medical issues and If  symptoms recur report to nearest emergency or crisis hot line.    Signed: Velna Hatchet May Agustin AGNP-BC 03/08/2016 10:28 am Patient seen, Suicide Assessment Completed.  Disposition Plan Reviewed

## 2016-03-06 NOTE — Progress Notes (Addendum)
  [facility] Discharge [facility] :  Will you be returning to the same living situation after discharge:  Yes,  Pt returning home At discharge, do you have transportation home?: Yes, friend to pick up Do you have the ability to pay for your medications: Yes,  Pt provided with samples and prescriptions  Release of information consent forms completed and in the chart;  Patient's signature needed at discharge.  Patient to Follow up at: Follow-up Information    Follow [facility] with [facility].   Why:  Please walk-in between 8am-12pm Monday-Friday to be established for services if [facility] unable to schedule.   Contact information:   [address] [place], [place] [address] Phone: [phone] Fax: [phone]      Follow up with [facility].   Why:  [facility] 6[facility] 3:00pm [facility]. Medication management appointment on 6/27 at 1:00pm with Mallie Snooksarol Rogers, NP.     Contact information:   [address] [place] [address] [phone]       Next level of care provider has [facility] to [facility] Link:no  Safety Planning and Suicide Prevention discussed: Yes,  with Pt; declines family contact  Have you used any form of tobacco in the last 30 days? (Cigarettes, Smokeless Tobacco, Cigars, and/or Pipes): Yes  Has patient been referred to the Quitline?: Patient refused referral  Patient has been referred for addiction treatment: Yes  Sarina SerGrossman-Orr, Mareida Jo 03/06/2016, 8:14 AM

## 2016-03-06 NOTE — [facility] Group Notes (Signed)
[facility] LCSW Group Therapy  03/06/2016 10:00 AM  Type of Therapy:  Group Therapy  Participation Level:  Minimal  Participation Quality:  Appropriate and Attentive  Affect:  Appropriate  Cognitive:  Appropriate  Insight:  Limited  Engagement in Therapy:  Limited  Modes of Intervention:  Discussion  Summary of Progress/Problems: Group today was about "Show and Tell." Participants were able to discuss and develop process to engage several parts of their identity for self-actualization and goal planning. Patients were able to discuss how they identify themselves, how their past has defined them and how others perceive them. Patients engaged in identifying goals to reflect how they can use the concepts discussed in creating appropriate plans to achieve goals. Patient arrived during the last 15 minutes of group. Patient was able to share that she struggles with the expectations of others as she pursues her own personal goals and identity.  Beverly SessionsLINDSEY, Caitlin Hapke J 03/06/2016, 3:42 PM

## 2016-03-07 NOTE — [facility] Group Notes (Signed)
[facility] Group Notes: (Clinical Social Work)   03/07/2016      Type of Therapy:  Group Therapy   Participation Level:  Did Not Attend despite MHT prompting   Ambrose MantleMareida Grossman-Orr, LCSW 03/07/2016, 4:26 PM

## 2016-03-07 NOTE — [facility] Group Notes (Signed)
[facility] Group Notes:  (Nursing/MHT/Case Management/Adjunct)  Date:  03/07/2016  Time:  10:55 AM Type of Therapy:  Psychoeducational Skills  Participation Level:  Did Not Attend  Participation Quality:  DID NOT ATTEND  Affect:  DID NOT ATTEND  Cognitive:  DID NOT ATTEND  Insight:  None  Engagement in Group:  DID NOT ATTEND  Modes of Intervention:  DID NOT ATTEND  Summary of Progress/Problems: Pt did not attend patient self inventory group.   Bethann PunchesJane O Sharell Hilmer 03/07/2016, 10:55 AM

## 2016-03-07 NOTE — Progress Notes (Signed)
[facility] Group Notes:  (Nursing/MHT/Case Management/Adjunct)  Date:  03/07/2016  Time:  12:31 AM  Type of Therapy:  Psychoeducational Skills  Participation Level:  Active  Participation Quality:  Appropriate  Affect:  Excited  Cognitive:  Appropriate  Insight:  Appropriate  Engagement in Group:  Engaged  Modes of Intervention:  Education  Summary of Progress/Problems: The patient described her day as having been "hectic" since she had blood pressure issues. She states that she felt lightheaded. As for the theme of the day, her coping skill will be to color.   Hazle CocaGOODMAN, Hiawatha Dressel S 03/07/2016, 12:31 AM

## 2016-03-07 NOTE — Progress Notes (Signed)
[facility] MD Progress Note  03/07/2016 1:58 PM Caitlin Bell  MRN:  [id number] Subjective: Patient reports ' I am getting better. I should be ready by tomorrow." I need a form to show to CPS that I don't have a substance abuse problem." . Objective : [name] is awake, alert and oriented X4. Seen sitting in dayroom interacting with peers. Denies suicidal or homicidal ideation. Denies auditory or visual hallucination and does not appear to be responding to internal stimuli. Patient reports interacts well with staff and others. Patient reports she is medication compliant without mediation side effects. Patient reports " I know I have to get my act together for my kid." Reports her depression 4/10. Reports good appetite and resting well. Patient report she is excited regarding discharge. Support, encouragement and reassurance was provided.    Principal Problem: Bipolar affective disorder, current episode severe (HCC) Diagnosis:   Patient Active Problem List   Diagnosis Date Noted  . Affective psychosis, bipolar (HCC) [F31.9]   . Bipolar affective disorder, depressed, severe (HCC) [F31.4] 03/01/2016  . Bipolar affective disorder, current episode severe (HCC) [F31.9] 03/01/2016  . Bipolar affective disorder, depressed, severe, with psychotic behavior (HCC) [F31.5] 02/02/2016  . Normal labor [O80, Z37.9] 10/31/2014  . Bipolar disorder (HCC) [F31.9] 10/31/2014  . Cholestasis of pregnancy [O26.619, K83.1] 10/31/2014  . H/O anorexia nervosa [Z86.59] 10/31/2014  . H/O bulimia nervosa [Z86.59] 10/31/2014  . Gestational diabetes [O24.419] 10/31/2014  . Depression--on Latuda [F32.9] 10/31/2014  . Umbilical hernia [K42.9] 10/31/2014  . Hx of preterm delivery  [O09.219] 10/31/2014  . Preterm delivery [O60.10X0] 10/31/2014  . Generalized anxiety disorder [F41.1] 10/31/2014  . History of marijuana use [Z87.898] 10/31/2014  . Cerebral palsy (HCC) [G80.9] 10/31/2014  . [redacted] weeks gestation of  pregnancy [Z3A.35]   . Hx of PTL (preterm labor), current pregnancy [O09.219] 10/30/2014  . Preterm labor [O60.10X0] 10/30/2014  . Back pain complicating pregnancy in third trimester [O26.893, M54.9] 10/14/2014   Total Time spent with patient: 20 minutes    Past Medical History:  Past Medical History  Diagnosis Date  . Cerebral palsy (HCC)     mild, speech impediment  . Anxiety   . Gestational diabetes   . Depression   . Bipolar disorder [facility])     Past Surgical History  Procedure Laterality Date  . Cholecystectomy  2014  . Hernia repair     Family History: History reviewed. No pertinent family history.  Social History:  History  Alcohol Use  . Yes    Comment: ocasionally, not with the pregnancy     History  Drug Use No    Comment: hx of marijuana use 3 weeks ago    Social History   Social History  . Marital Status: Single    Spouse Name: N/A  . Number of Children: N/A  . Years of Education: N/A   Social History Main Topics  . Smoking status: Former Smoker    Quit date: 10/31/2011  . Smokeless tobacco: Never Used  . Alcohol Use: Yes     Comment: ocasionally, not with the pregnancy  . Drug Use: No     Comment: hx of marijuana use 3 weeks ago  . Sexual Activity: Yes    Birth Control/ Protection: None   Other Topics Concern  . None   Social History Narrative   Additional Social History:   Sleep:  Fair   Appetite:  Good  Current Medications: Current Facility-Administered Medications  Medication Dose Route Frequency Provider  Last Rate Last Dose  . acetaminophen (TYLENOL) tablet 650 mg  650 mg Oral Q6H PRN Charm RingsJamison Y Lord, NP      . alum & mag hydroxide-simeth (MAALOX/MYLANTA) 200-200-20 MG/5ML suspension 30 mL  30 mL Oral Q4H PRN Charm RingsJamison Y Lord, NP      . clonazePAM [name](KLONOPIN) tablet 0.5 mg  0.5 mg Oral TID PRN Craige CottaFernando A Cobos, MD   0.5 mg at 03/07/16 1140  . gabapentin (NEURONTIN) capsule 200 mg  200 mg Oral TID Cleotis NipperSyed T Jahnessa Vanduyn, MD   200 mg at 03/07/16  1139  . iloperidone (FANAPT) tablet 2 mg  2 mg Oral BID Craige CottaFernando A Cobos, MD   2 mg at 03/07/16 0827  . magnesium hydroxide (MILK OF MAGNESIA) suspension 30 mL  30 mL Oral Daily PRN Charm RingsJamison Y Lord, NP      . traZODone (DESYREL) tablet 50 mg  50 mg Oral QHS PRN Shuvon B Rankin, NP        Lab Results: No results found for this or any previous visit (from the past 48 hour(s)).  Blood Alcohol level:  Lab Results  Component Value Date   ETH <5 02/29/2016   ETH <5 02/02/2016    Physical Findings: AIMS: Facial and Oral Movements Muscles of Facial Expression: None, normal Lips and Perioral Area: None, normal Jaw: None, normal Tongue: None, normal,Extremity Movements Upper (arms, wrists, hands, fingers): None, normal Lower (legs, knees, ankles, toes): None, normal, Trunk Movements Neck, shoulders, hips: None, normal, Overall Severity Severity of abnormal movements (highest score from questions above): None, normal Incapacitation due to abnormal movements: None, normal Patient's awareness of abnormal movements (rate only patient's report): No Awareness, Dental Status Current problems with teeth and/or dentures?: No Does patient usually wear dentures?: No  CIWA:    COWS:     Musculoskeletal: Strength & Muscle Tone: within normal limits Gait & Station: normal Patient leans: N/A  Psychiatric Specialty Exam: Physical Exam  Nursing note and vitals reviewed. Psychiatric: She has a normal mood and affect. Her behavior is normal. Thought content normal.    Review of Systems  Psychiatric/Behavioral: Positive for depression. Negative for suicidal ideas and hallucinations. The patient is nervous/anxious and has insomnia.   All other systems reviewed and are negative.  denies headache, denies chest pain, no shortness of breath, no nausea, no vomiting   Blood pressure 118/62, pulse 61, temperature 98.3 F (36.8 C), temperature source Oral, resp. rate 16, height 5\' 4"  (1.626 m), weight 58.06  kg (128 lb), last menstrual period 02/29/2016, SpO2 100 %, not currently breastfeeding.Body mass index is 21.96 kg/(m^2).  General Appearance: Casual  Eye Contact:  Fair  Speech:  Slow  Volume:  Decreased  Mood: Sad and depressed   Affect: Constricted   Thought Process:  Linear  Orientation:  Full (Time, Place, and Person)  Thought Content:  no psychotic symptoms  Suicidal Thoughts:  No active suicidal thoughts but continues to have fleeting and passive suicidal thoughts.      Homicidal Thoughts:  No  Memory:  recent And remote grossly intact   Judgement:  Fair   Insight:  Fair  Psychomotor Activity:  Normal  Concentration:  Concentration: Fair and Attention Span: Fair  Recall:  Good  Fund of Knowledge:  Good  Language:  Good  Akathisia:  Negative  Handed:  Right  AIMS (if indicated):   no akathisia   Assets:  Communication Skills Desire for Improvement Resilience  ADL's:  Intact  Cognition:  WNL  Sleep:  Number of Hours: 5.75    I agree with current treatment plan on 03/07/2016, Patient seen face-to-face for psychiatric evaluation follow-up, chart reviewed. Reviewed the information documented and agree with the treatment plan.  Treatment Plan Summary: Daily contact with patient to assess and evaluate symptoms and progress in treatment, Medication management, Plan inpatient treatment  and medications as below   Orders placed for EKG.-baseline,-check QT interval - Pending results Continue gabapentin 200 mg 3 times a day, discussed medication side effects especially sedation nausea and drowsiness.   Continue Fanapt 2 mgrs BID for mood disorder/psychosis Continue Klonopin 0.5 mgrs TID PRN for anxiety  Encourage increased group, milieu participation to work on coping skills and symptom reduction Blood work, her dependency test and UDS is negative, her basic chemistry is normal . Treatment team working on disposition planning  CSW will contact mother,if patient provides consent,  to try to arrange family meeting   Oneta Rack, NP 03/07/2016, 1:58 PM   I reviewed chart and agreed with the findings and treatment Plan.  Kathryne Sharper, MD

## 2016-03-07 NOTE — Progress Notes (Signed)
Writer observed patient up in the dayroom coloring while others patients were playing cards close by. Writer spoke to her 1:1 about the incident that took place prior to Clinical research associatewriter coming on shift. Writer praised her for not reacting in a negative way but instead sought out staff to talk to. She reports that she may discharge on tomorrow and feels that she is ready. She denies si/hi/a/v hallucinations. Support given and safety maintained on unit with 15 min checks.

## 2016-03-07 NOTE — Progress Notes (Signed)
D: Pt continues to be very flat and depressed on the unit today. Pt also continues to be very isolative, stayed in the room most of the time. Pt complained of being weak and tired, pt encouraged to drink a lot of fluid. Pt provided with medications per providers orders. Pt's labs and vitals were monitored throughout the day. Pt supported emotionally and encouraged to express concerns and questions. Pt educated on medications. Pt reported that his depression was a 0, his hopelessness was a 0, and that his anxiety was a 0. Pt reported being negative SI/HI, no AH/VH noted. A: 15 min checks continued for patient safety. R: Pts safety maintained.
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
## 2016-03-08 MED ORDER — GABAPENTIN 100 MG PO CAPS: 200.0000 mg | ORAL_CAPSULE | Freq: Three times a day (TID) | ORAL | Status: DC

## 2016-03-08 NOTE — [facility] Suicide Risk Assessment (Addendum)
[facility]   Principal Problem: Bipolar affective disorder, current episode severe [facility](HCC) Discharge Diagnoses:  Patient Active Problem List   Diagnosis Date Noted  . Affective psychosis, bipolar (HCC) [F31.9]   . Bipolar affective disorder, depressed, severe (HCC) [F31.4] 03/01/2016  . Bipolar affective disorder, current episode severe (HCC) [F31.9] 03/01/2016  . Bipolar affective disorder, depressed, severe, with psychotic behavior (HCC) [F31.5] 02/02/2016  . Normal labor [O80, Z37.9] 10/31/2014  . Bipolar disorder (HCC) [F31.9] 10/31/2014  . Cholestasis of pregnancy [O26.619, K83.1] 10/31/2014  . H/O anorexia nervosa [Z86.59] 10/31/2014  . H/O bulimia nervosa [Z86.59] 10/31/2014  . Gestational diabetes [O24.419] 10/31/2014  . Depression--on Latuda [F32.9] 10/31/2014  . Umbilical hernia [K42.9] 10/31/2014  . Hx of preterm delivery  [O09.219] 10/31/2014  . Preterm delivery [O60.10X0] 10/31/2014  . Generalized anxiety disorder [F41.1] 10/31/2014  . History of marijuana use [Z87.898] 10/31/2014  . Cerebral palsy (HCC) [G80.9] 10/31/2014  . [redacted] weeks gestation of pregnancy [Z3A.35]   . Hx of PTL (preterm labor), current pregnancy [O09.219] 10/30/2014  . Preterm labor [O60.10X0] 10/30/2014  . Back pain complicating pregnancy in third trimester [O26.893, M54.9] 10/14/2014    Total Time spent with patient: 30 minutes  Musculoskeletal: Strength & Muscle Tone: within normal limits Gait & Station: normal Patient leans: N/A  Psychiatric Specialty Exam: ROS  Blood pressure 99/62, pulse 130, temperature 98.3 F (36.8 C), temperature source Oral, resp. rate 16, height 5\' 4"  (1.626 m), weight 128 lb (58.06 kg), last menstrual period 02/29/2016, SpO2 100 %, not currently breastfeeding.Body mass index is 21.96 kg/(m^2).  General Appearance: improved grooming   Eye Contact::  Good  Speech:  Normal Rate409  Volume:  Normal  Mood:  improved and at this time  minimizes depression   Affect:  Appropriate and fuller in range, smiles at times appropriately   Thought Process:  Linear  Orientation:  Full (Time, Place, and Person)  Thought Content:  denies hallucinations, no delusions, not internally preoccupied   Suicidal Thoughts:  No she denies any suicidal or self injurious ideations   Homicidal Thoughts:  No she denies any homicidal or violent ideations   Memory:  recent and remote grossly intact   Judgement:  Good  Insight:  Good  Psychomotor Activity:  Normal  Concentration:  Good  Recall:  Good  Fund of Knowledge:Good  Language: Good  Akathisia:  Negative  Handed:  Right  AIMS (if indicated):     Assets:  Resilience   Sleep:  Number of Hours: 3.75  Cognition: WNL  ADL's:  Intact   Mental Status Per Nursing Assessment::   On Admission:  Suicidal ideation indicated by patient, Self-harm thoughts, Self-harm behaviors, Intention to act on suicide plan, Belief that plan would result in death  Demographic Factors:  25 year old single female, 4 children, currently in custody of patient's mother   Loss Factors: DSS involvement, lost custody of children  Historical Factors: History of mood disorder , has been diagnosed with bipolar disorder in the past, and has history of prior psychiatric admissions   Risk Reduction Factors:   Responsible for children under 25 years of age, Sense of responsibility to family and Positive coping skills or problem solving skills  Continued Clinical Symptoms:  At this time patient is improved compared to her admission presentation- presents alert, attentive, calm, pleasant on approach,  mood is improved and currently denies depression , affect is fuller in range, no thought disorder, no SI, no HI, no psychotic symptoms.  Denies medication side effects at present .   Cognitive Features That Contribute To Risk:  No gross cognitive deficits noted upon discharge. Is alert , attentive, and oriented x 3    Suicide Risk:  Mild:  Suicidal ideation of limited frequency, intensity, duration, and specificity.  There are no identifiable plans, no associated intent, mild dysphoria and related symptoms, good self-control (both objective and subjective assessment), few other risk factors, and identifiable protective factors, including available and accessible social support.  Follow-up Information    Follow up with [facility].   Why:  Please walk-in between 8am-12pm Monday-Friday to be established for services if [facility] is unable to schedule.   Contact information:   [address] [place], [place] [address] Phone: [phone] Fax: [phone]      Follow up with [facility].   Contact information:   [address] [place] [address] [phone]       Plan Of Care/Follow-up recommendations:  Activity:  as tolerated  Diet:  Regular  Tests:  NA Other:  see below  Patient is leaving unit in good spirits  Follow up as above  Nehemiah Massed, MD 03/08/2016, 11:33 AM
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
## 2016-03-08 NOTE — Progress Notes (Signed)
Pt discharged home with the sitter. Pt was ambulatory, stable and appreciative at that time. All papers and prescriptions were given and valuables returned. Verbal understanding expressed. Denies SI/HI and A/VH. Pt given opportunity to express concerns and ask questions.

## 2016-05-12 ENCOUNTER — Ambulatory Visit (HOSPITAL_COMMUNITY)
Admission: RE | Admit: 2016-05-12 | Discharge: 2016-05-12 | Disposition: A | Discharge status: Home / Self Care | Payer: Medicaid Other | Attending: Psychiatry | Admitting: Psychiatry

## 2016-05-12 DIAGNOSIS — F315 Bipolar disorder, current episode depressed, severe, with psychotic features: Secondary | ICD-10-CM | POA: Diagnosis not present

## 2016-05-12 DIAGNOSIS — F419 Anxiety disorder, unspecified: Secondary | ICD-10-CM | POA: Diagnosis not present

## 2016-05-12 DIAGNOSIS — G808 Other cerebral palsy: Secondary | ICD-10-CM | POA: Insufficient documentation

## 2016-05-12 DIAGNOSIS — F329 Major depressive disorder, single episode, unspecified: Secondary | ICD-10-CM | POA: Diagnosis not present

## 2016-05-13 ENCOUNTER — Encounter (HOSPITAL_COMMUNITY): Payer: Self-pay | Admitting: Behavioral Health

## 2016-05-13 ENCOUNTER — Inpatient Hospital Stay (HOSPITAL_COMMUNITY)
Admission: EM | Admit: 2016-05-13 | Discharge: 2016-05-21 | DRG: 885 | Disposition: A | Discharge status: Home / Self Care | Payer: Medicaid Other | Source: Intra-hospital | Attending: Addiction Medicine | Admitting: Addiction Medicine

## 2016-05-13 ENCOUNTER — Encounter (HOSPITAL_COMMUNITY): Payer: Self-pay | Admitting: Emergency Medicine

## 2016-05-13 ENCOUNTER — Emergency Department (HOSPITAL_COMMUNITY)
Admission: EM | Admit: 2016-05-13 | Discharge: 2016-05-13 | Disposition: A | Discharge status: Other Acute Inpatient Hospital | Payer: Medicaid Other | Attending: Emergency Medicine | Admitting: Emergency Medicine

## 2016-05-13 DIAGNOSIS — F315 Bipolar disorder, current episode depressed, severe, with psychotic features: Secondary | ICD-10-CM | POA: Diagnosis present

## 2016-05-13 DIAGNOSIS — Z87891 Personal history of nicotine dependence: Secondary | ICD-10-CM

## 2016-05-13 DIAGNOSIS — Z79899 Other long term (current) drug therapy: Secondary | ICD-10-CM | POA: Diagnosis not present

## 2016-05-13 DIAGNOSIS — Z5181 Encounter for therapeutic drug level monitoring: Secondary | ICD-10-CM | POA: Diagnosis not present

## 2016-05-13 DIAGNOSIS — F319 Bipolar disorder, unspecified: Secondary | ICD-10-CM | POA: Diagnosis present

## 2016-05-13 DIAGNOSIS — R45851 Suicidal ideations: Secondary | ICD-10-CM | POA: Diagnosis present

## 2016-05-13 DIAGNOSIS — G809 Cerebral palsy, unspecified: Secondary | ICD-10-CM | POA: Diagnosis present

## 2016-05-13 LAB — I-STAT BETA HCG BLOOD, ED (MC, WL, AP ONLY): I-stat hCG, quantitative: <5 m[IU]/mL (ref <5)

## 2016-05-13 LAB — COMPREHENSIVE METABOLIC PANEL
ALT: 23 U/L (ref 14–54)
AST: 19 U/L (ref 15–41)
Albumin: 5.1 g/dL — ABNORMAL HIGH (ref 3.5–5.0)
Alkaline Phosphatase: 98 U/L (ref 38–126)
Anion gap: 8 (ref 5–15)
BUN: 11 mg/dL (ref 6–20)
CO2: 25 mmol/L (ref 22–32)
Calcium: 9.6 mg/dL (ref 8.9–10.3)
Chloride: 105 mmol/L (ref 101–111)
Creatinine, Ser: 0.77 mg/dL (ref 0.44–1.00)
GFR calc Af Amer: >60 mL/min (ref >60)
GFR calc non Af Amer: >60 mL/min (ref >60)
Glucose, Bld: 95 mg/dL (ref 65–99)
Potassium: 3.2 mmol/L — ABNORMAL LOW (ref 3.5–5.1)
Sodium: 138 mmol/L (ref 135–145)
Total Bilirubin: 0.4 mg/dL (ref 0.3–1.2)
Total Protein: 8 g/dL (ref 6.5–8.1)

## 2016-05-13 LAB — ETHANOL: Alcohol, Ethyl (B): <5 mg/dL (ref <5)

## 2016-05-13 LAB — CBC
HEMATOCRIT: 39.6 % (ref 36.0–46.0)
Hemoglobin: 13.8 g/dL (ref 12.0–15.0)
MCH: 28.5 pg (ref 26.0–34.0)
MCHC: 34.8 g/dL (ref 30.0–36.0)
MCV: 81.6 fL (ref 78.0–100.0)
PLATELETS: 275 10*3/uL (ref 150–400)
RBC: 4.85 MIL/uL (ref 3.87–5.11)
RDW: 13.1 % (ref 11.5–15.5)
WBC: 8.3 10*3/uL (ref 4.0–10.5)

## 2016-05-13 LAB — RAPID URINE DRUG SCREEN, HOSP PERFORMED
AMPHETAMINES: NOT DETECTED
BENZODIAZEPINES: NOT DETECTED
Barbiturates: NOT DETECTED
COCAINE: NOT DETECTED
Opiates: NOT DETECTED
Tetrahydrocannabinol: NOT DETECTED

## 2016-05-13 LAB — SALICYLATE LEVEL: Salicylate Lvl: <4 mg/dL (ref 2.8–30.0)

## 2016-05-13 LAB — ACETAMINOPHEN LEVEL: Acetaminophen (Tylenol), Serum: <10 ug/mL — ABNORMAL LOW (ref 10–30)

## 2016-05-13 MED ORDER — QUETIAPINE FUMARATE 50 MG PO TABS
150.0000 mg | ORAL_TABLET | Freq: Every day | ORAL | Status: DC
  Administered 2016-05-13 – 2016-05-15 (×3): 150 mg via ORAL
  Filled 2016-05-13 (×6): qty 1

## 2016-05-13 MED ORDER — GABAPENTIN 100 MG PO CAPS
200.0000 mg | ORAL_CAPSULE | Freq: Three times a day (TID) | ORAL | Status: DC
  Administered 2016-05-14 (×3): 200 mg via ORAL
  Filled 2016-05-13 (×7): qty 2

## 2016-05-13 MED ORDER — ONDANSETRON HCL 4 MG PO TABS
4.0000 mg | ORAL_TABLET | Freq: Three times a day (TID) | ORAL | Status: DC | PRN
  Administered 2016-05-13 – 2016-05-19 (×3): 4 mg via ORAL
  Filled 2016-05-13 (×4): qty 1

## 2016-05-13 MED ORDER — ENSURE ENLIVE PO LIQD
237.0000 mL | Freq: Two times a day (BID) | ORAL | Status: DC
  Administered 2016-05-13: 237 mL via ORAL

## 2016-05-13 MED ORDER — POTASSIUM CHLORIDE CRYS ER 20 MEQ PO TBCR
40.0000 meq | EXTENDED_RELEASE_TABLET | Freq: Once | ORAL | Status: DC
  Filled 2016-05-13: qty 2

## 2016-05-13 MED ORDER — QUETIAPINE FUMARATE 50 MG PO TABS: 50.0000 mg | ORAL_TABLET | Freq: Every evening | ORAL | Status: DC | PRN

## 2016-05-13 MED ORDER — ONDANSETRON HCL 4 MG PO TABS
4.0000 mg | ORAL_TABLET | Freq: Three times a day (TID) | ORAL | Status: DC | PRN
  Administered 2016-05-13: 4 mg via ORAL
  Filled 2016-05-13 (×2): qty 1

## 2016-05-13 MED ORDER — ACETAMINOPHEN 325 MG PO TABS: 650.0000 mg | ORAL_TABLET | ORAL | Status: DC | PRN

## 2016-05-13 MED ORDER — POTASSIUM CHLORIDE CRYS ER 20 MEQ PO TBCR
40.0000 meq | EXTENDED_RELEASE_TABLET | Freq: Two times a day (BID) | ORAL | Status: DC
  Administered 2016-05-13 (×2): 40 meq via ORAL
  Filled 2016-05-13: qty 2

## 2016-05-13 MED ORDER — ACETAMINOPHEN 325 MG PO TABS
650.0000 mg | ORAL_TABLET | ORAL | Status: DC | PRN
  Administered 2016-05-13: 650 mg via ORAL
  Filled 2016-05-13: qty 2

## 2016-05-13 MED ORDER — QUETIAPINE FUMARATE 50 MG PO TABS
50.0000 mg | ORAL_TABLET | Freq: Every evening | ORAL | Status: DC | PRN
  Administered 2016-05-13: 50 mg via ORAL
  Filled 2016-05-13: qty 1

## 2016-05-13 MED ORDER — POTASSIUM CHLORIDE CRYS ER 20 MEQ PO TBCR
40.0000 meq | EXTENDED_RELEASE_TABLET | Freq: Two times a day (BID) | ORAL | Status: DC
  Administered 2016-05-13: 40 meq via ORAL
  Filled 2016-05-13 (×2): qty 2

## 2016-05-13 NOTE — [facility] Suicide Risk Assessment (Signed)
[facility] Admission Suicide Risk Assessment   Nursing information obtained from:  Patient Demographic factors:  Adolescent or young adult, Caucasian, Low socioeconomic status, Living alone, Unemployed Current Mental Status:  Suicidal ideation indicated by patient, Suicide plan, Self-harm thoughts, Intention to act on suicide plan Loss Factors:  Financial problems / change in socioeconomic status Historical Factors:  Family history of suicide, Family history of mental illness or substance abuse Risk Reduction Factors:  Responsible for children under 48 years of age, Positive social support, Positive therapeutic relationship  Total Time spent with patient: 45 minutes Principal Problem:  Bipolar Disorder  Diagnosis:   Patient Active Problem List   Diagnosis Date Noted  . Bipolar I disorder, most recent episode depressed, severe w psychosis (HCC) [F31.5] 05/13/2016  . Affective psychosis, bipolar (HCC) [F31.9]   . Bipolar affective disorder, depressed, severe (HCC) [F31.4] 03/01/2016  . Bipolar affective disorder, current episode severe (HCC) [F31.9] 03/01/2016  . Bipolar affective disorder, depressed, severe, with psychotic behavior (HCC) [F31.5] 02/02/2016  . Normal labor [O80, Z37.9] 10/31/2014  . Bipolar disorder (HCC) [F31.9] 10/31/2014  . Cholestasis of pregnancy [O26.619, K83.1] 10/31/2014  . H/O anorexia nervosa [Z86.59] 10/31/2014  . H/O bulimia nervosa [Z86.59] 10/31/2014  . Gestational diabetes [O24.419] 10/31/2014  . Depression--on Latuda [F32.9] 10/31/2014  . Umbilical hernia [K42.9] 10/31/2014  . Hx of preterm delivery  [O09.219] 10/31/2014  . Preterm delivery [O60.10X0] 10/31/2014  . Generalized anxiety disorder [F41.1] 10/31/2014  . History of marijuana use [Z87.898] 10/31/2014  . Cerebral palsy (HCC) [G80.9] 10/31/2014  . [redacted] weeks gestation of pregnancy [Z3A.35]   . Hx of PTL (preterm labor), current pregnancy [O09.219] 10/30/2014  . Preterm labor [O60.10X0] 10/30/2014  .  Back pain complicating pregnancy in third trimester [O26.893, M54.9] 10/14/2014     Continued Clinical Symptoms:  Alcohol Use Disorder Identification Test Final Score (AUDIT): 0 The "Alcohol Use Disorders Identification Test", Guidelines for Use in Primary Care, Second Edition.  World Science writer [facility]). Score between 0-7:  no or low risk or alcohol related problems. Score between 8-15:  moderate risk of alcohol related problems. Score between 16-19:  high risk of alcohol related problems. Score 20 or above:  warrants further diagnostic evaluation for alcohol dependence and treatment.   CLINICAL FACTORS:  Patient is a 25 year old female, known to our unit from prior admissions, has been diagnosed with Bipolar Disorder, presents due to worsening mood swings, depression, suicidal ideations. Medication non compliance x several weeks, and facing significant stressors, mainly regarding not having custody of her children .     Psychiatric Specialty Exam: Physical Exam  ROS  Blood pressure 96/72, pulse 100, temperature 98.7 F (37.1 C), temperature source Oral, resp. rate 16, height  (1.6 m), weight 125 lb (56.7 kg), last menstrual period 04/29/2016, SpO2 100 %, not currently breastfeeding.Body mass index is 22.14 kg/m.  See admit note MSE   COGNITIVE FEATURES THAT CONTRIBUTE TO RISK:  Closed-mindedness and Loss of executive function    SUICIDE RISK:   Moderate:  Frequent suicidal ideation with limited intensity, and duration, some specificity in terms of plans, no associated intent, good self-control, limited dysphoria/symptomatology, some risk factors present, and identifiable protective factors, including available and accessible social support.   PLAN OF CARE: Patient will be admitted to inpatient psychiatric unit for stabilization and safety. Will provide and encourage milieu participation. Provide medication management and maked adjustments as needed.  Will follow daily.     I certify that inpatient services furnished can  reasonably be expected to improve the patient's condition.  Nehemiah MassedOBOS, FERNANDO, MD 05/13/2016, 3:37 PM

## 2016-05-13 NOTE — Tx Team (Signed)
Interdisciplinary Treatment Plan Update (Adult) Date: 05/13/2016   Date: 05/13/2016 3:34 PM  Progress in Treatment:  Attending groups: Pt is new to milieu, continuing to assess   Participating in groups: Pt is new to milieu, continuing to assess  Taking medication as prescribed: Yes  Tolerating medication: Yes  Family/Significant othe contact made: No, Pt declines Patient understands diagnosis: Yes AEB seeking help for depression Discussing patient identified problems/goals with staff: Yes  Medical problems stabilized or resolved: Yes  Denies suicidal/homicidal ideation: No, recently admitted with SI Patient has not harmed self or Others: Yes   New problem(s) identified: None identified at this time.   Discharge Plan or Barriers: CSW will assess for appropriate discharge plan and relevant barriers.   Additional comments:  Patient and CSW reviewed pt's identified goals and treatment plan. Patient verbalized understanding and agreed to treatment plan. CSW reviewed [facility] "Discharge Process and Patient Involvement" Form. Pt verbalized understanding of information provided and signed form.   Reason for Continuation of Hospitalization:  Anxiety Depression Medication stabilization Suicidal ideation  Estimated length of stay: 3-5 days  Review of initial/current patient goals per problem list:   1.  Goal(s): Patient will participate in aftercare plan  Met:  No  Target date: 3-5 days from date of admission   As evidenced by: Patient will participate within aftercare plan AEB aftercare provider and housing plan at discharge being identified.  05/13/16: CSW to work with Pt to assess for appropriate discharge plan and faciliate appointments and referrals as needed prior to d/c.  2.  Goal (s): Patient will exhibit decreased depressive symptoms and suicidal ideations.  Met:  No  Target date: 3-5 days from date of admission   As evidenced by: Patient will utilize self rating of depression  at 3 or below and demonstrate decreased signs of depression or be deemed stable for discharge by MD. 05/13/16: Pt was admitted with symptoms of depression, rating 10/10. Pt continues to present with flat affect and depressive symptoms.  Pt will demonstrate decreased symptoms of depression and rate depression at 3/10 or lower prior to discharge.  3.  Goal(s): Patient will demonstrate decreased signs and symptoms of anxiety.  Met:  No  Target date: 3-5 days from date of admission   As evidenced by: Patient will utilize self rating of anxiety at 3 or below and demonstrated decreased signs of anxiety, or be deemed stable for discharge by MD 05/13/16: Pt was admitted with increased levels of anxiety and is currently rating those symptoms highly. Pt will demonstrated decreased symptoms of anxiety and rate it at 3/10 prior to d/c.   Attendees:  Patient:    Family:    Physician: Dr. Parke Poisson, MD  05/13/2016 3:34 PM  Nursing: Darrol Angel, RN 05/13/2016 3:34 PM  Clinical Social Worker Peri Maris, [place] 05/13/2016 3:34 PM  Other: Tilden Fossa, [place] 05/13/2016 3:34 PM  Clinical:   05/13/2016 3:34 PM  Other: , RN Charge Nurse 05/13/2016 3:34 PM  Other:     Peri Maris, [place] Work [phone]

## 2016-05-13 NOTE — BH Assessment (Signed)
[facility] Assessment Progress Note  Per morning shift report, Donell SievertSpencer Simon, PA, has determined that this pt requires psychiatric hospitalization at this time.  Berneice Heinrichina Tate, RN, [facility] has assigned pt to Rm 407-1, and she is now ready to receive pt.  Pt has signed Voluntary Admission and Consent for Treatment, as well as Consent to Release Information to [facility], her outpatient provider, and a notification call has been placed.  Signed [facility].  Pt's nurse, Wille CelesteJanie, has been notified, and agrees to send original paperwork along with pt via [place] [place], and to call report to [phone].  Doylene Canninghomas Mieko Kneebone, MA Triage Specialist [phone]

## 2016-05-13 NOTE — BHH Counselor (Signed)
Adult Comprehensive Assessment  Patient ID: Caitlin Bell, female DOB: January 21, 1991, 25 y.o. MRN: [id number]  Information Source: Information source: Patient  Current Stressors:  Educational / Learning stressors: Denies stressors Employment / Job issues: Unemployed; on SSI Family Relationships: Does not get along well with mother Surveyor, quantity / Lack of resources (include bankruptcy): None reported Housing / Lack of housing: Housing situation is somewhat unstable Physical health (include injuries & life threatening diseases): Denies stressors Social relationships: Denies stressors Substance abuse: Daily alcohol use; hx of substance abuse Bereavement / Loss: Denies stressors  Living/Environment/Situation:  Living Arrangements: Alone Living conditions (as described by patient or guardian): "okay" How long has patient lived in current situation?: "recently homeless" What is atmosphere in current home: Chaotic  Family History:  Marital status: Single Are you sexually active?: Yes What is your sexual orientation?: Straight Has your sexual activity been affected by drugs, alcohol, medication, or emotional stress?: None Does patient have children?: Yes How many children?: 4 How is patient's relationship with their children?: 8yo, 2yo, 1yo, and 32mo - relationship is good. Mother has custody; Pt sees children on Mondays  Childhood History:  By whom was/is the patient raised?: Mother Additional childhood history information: No contact with father Description of patient's relationship with caregiver when they were a child: Good relationship with mother as a child Patient's description of current relationship with people who raised him/her: Pt's 8yo daughter lives with mother, so that puts a strain on their relationship. Pt just got her younger 3 children back from mother about a month ago. How were you disciplined when you got in trouble as a child/adolescent?: Spanked,  spanked Does patient have siblings?: Yes Number of Siblings: 1 Description of patient's current relationship with siblings: 1 half-sister, very good relationship Did patient suffer any verbal/emotional/physical/sexual abuse as a child?: No Did patient suffer from severe childhood neglect?: No Has patient ever been sexually abused/assaulted/raped as an adolescent or adult?: No Was the patient ever a victim of a crime or a disaster?: No Witnessed domestic violence?: No Has patient been effected by domestic violence as an adult?: No  Education:  Highest grade of school patient has completed: 12th grade Currently a student?: No Learning disability?: No  Employment/Work Situation:  Employment situation: Unemployed What is the longest time patient has a held a job?: 3 months 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 Has patient ever been in the [employer]?: No Are There Guns or Other Weapons in Your Home?: No  Financial Resources:  Surveyor, quantity resources: [employer] (TANF), Medicaid, Sales executive Does patient have a Lawyer or guardian?: No  Alcohol/Substance Abuse:  What has been your use of drugs/alcohol within the last 12 months?: Small bottle of Smirnoff daily Alcohol/Substance Abuse Treatment Hx: Denies past history Has alcohol/substance abuse ever caused legal problems?: No  Social Support System:  Forensic psychologist System: Fair Museum/gallery exhibitions officer System: Mom, half-sister Type of faith/religion: None  Leisure/Recreation:  Leisure and Hobbies: Higher education careers adviser TV, reads books  Strengths/Needs:  What things does the patient do well?: Likes to organize In what areas does patient struggle / problems for patient: Mental health, Auditory/visual hallucinations, depression, anxiety  Discharge Plan:  Does patient have access to transportation?: Yes Will patient be returning to same living situation after  discharge?: Yes Currently receiving community mental health services: Yes- [facility] If no, would patient like referral for services when discharged?: No Does patient have financial barriers related to discharge medications?:Yes, losing her Medicaid  Summary/Recommendations:  Patient is a 25 year old female/female with a diagnosis of Bipolar I Disorder. Pt presented to the hospital with symptoms of mania and suicidal thoughts. Pt reports primary trigger(s) for admission was medication noncompliance. Patient will benefit from crisis stabilization, medication evaluation, group therapy and psycho education in addition to case management for discharge planning. At discharge it is recommended that Pt remain compliant with established discharge plan and continued treatment.  Chad CordialLauren Carter, LCSW Clinical Social Work [phone]

## 2016-05-13 NOTE — BHH Counselor (Addendum)
Writer contacted [facility] spoke to Roxanna,  intake specialist. It was noted that [facility] has [facility], writer provided the intake specialist with pt's record number and a gave the intake specialist her contact information.  Writer faxed referral for impatient treatment to the following: [facility], [place], [place], [place], [facility], [place], [place], [address] [place], [place], and [place].  Gwinda Passereylese D Bennett, MS, [facility], [facility] Triage Specialist [phone]

## 2016-05-13 NOTE — ED Notes (Addendum)
Patient A/O, no noted distress. Patient notes thoughts of SI, but no plan. She has experienced losses of children. Children is in her mother's care and she does have visitation right. Patient noted she slept well. Writer Banker(RN) informed patient she will transferred [facility]. She tolerated her meds well. Staff will continue to monitor, maintain safety, and safely transport her [facility]. Patient c/o nausea.

## 2016-05-13 NOTE — Progress Notes (Signed)
Contacted [facility] So(Phyllis) for transportation.

## 2016-05-13 NOTE — ED Notes (Signed)
Patient is beings transported by [facility] to transfer [facility]. Safety maintained.

## 2016-05-13 NOTE — Progress Notes (Signed)
Patient ID: Caitlin Bell, female   DOB: 04/14/1991, 25 y.o.   MRN: [id number]  Pt currently presents with an anxious affect and fidgety behavior. Pt speech is pressured and rapid. Interaction with pt childlike, needy. Pt reports to writer that their goal is "I don't have anything for anxiety and I'm really anxious right now. Vistaril only makes things worse when I'm manic like I have been, I'd like to get something." Pt states "I am having some pain in my legs from my cerebral palsy, I was taking Neurontin at home, can I get that again" Pt reports poor sleep with current medication regimen, pt slept 3.75 hours last night.   Pt provided with medications per providers orders. Pt's labs and vitals were monitored throughout the night. Pt supported emotionally and encouraged to express concerns and questions. Pt educated on medications and alternative stress relieving techniques. Pt reports that coloring mandalas and coloring pages work to relieve stress, pt given materials. Pt worried about "sleeping without noise and without my dog." Pt given extra blanket, fan turned on and extra pillow to sleep with for [name]. Pt high fall risk, all signage checked.   Pt's safety ensured with 15 minute and environmental checks. Pt currently denies SI/HI and A/V hallucinations. Pt verbally agrees to seek staff if SI/HI or A/VH occurs and to consult with staff before acting on any harmful thoughts. Pt seen interacting with peer on another hall, pt excited and states "I was on the hall with her last time I was here, she is therapeutic for me." Pt encouraged to stay on hall, re-oriented to rules. Will continue POC.
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
## 2016-05-13 NOTE — ED Notes (Signed)
Pt presents with SI, no plan and depression, stating she recently lost custody of her children to foster care and she discovered her boyfriend cheated on her when she was pregnant.  Feeling hopeless and experiencing visual hallucinations.  Denies previous SI attempts but admits to cutting.  Pt reports diagnosed with Bipolar DO, Cerebral Palsy, Manic Depressive and Anxiety.  Pt reports off meds x 1 mos. A&O x 3, no distress noted, calm & cooperative, monitoring for safety, Q 15 min checks in effect.

## 2016-05-13 NOTE — ED Notes (Signed)
Patient denies pain and is resting comfortably.  

## 2016-05-13 NOTE — ED Notes (Signed)
[facility], she is ready to accept patient.

## 2016-05-13 NOTE — H&P (Signed)
Psychiatric Admission Assessment Adult  Patient Identification: Caitlin Bell MRN:  [id number] Date of Evaluation:  05/13/2016 Chief Complaint:   " really bad mood swings " Principal Diagnosis:  Bipolar Disorder, Depressed  Diagnosis:   Patient Active Problem List   Diagnosis Date Noted  . Bipolar I disorder, most recent episode depressed, severe w psychosis ([place]) [F31.5] 05/13/2016  . Affective psychosis, bipolar ([place]) [F31.9]   . Bipolar affective disorder, depressed, severe ([place]) [F31.4] 03/01/2016  . Bipolar affective disorder, current episode severe ([place]) [F31.9] 03/01/2016  . Bipolar affective disorder, depressed, severe, with psychotic behavior ([place]) [F31.5] 02/02/2016  . Normal labor [O80, Z37.9] 10/31/2014  . Bipolar disorder ([place]) [F31.9] 10/31/2014  . Cholestasis of pregnancy [O26.619, K83.1] 10/31/2014  . H/O anorexia nervosa [Z86.59] 10/31/2014  . H/O bulimia nervosa [Z86.59] 10/31/2014  . Gestational diabetes [O24.419] 10/31/2014  . Depression--on Latuda [F32.9] 10/31/2014  . Umbilical hernia [Z36.6] 10/31/2014  . Hx of preterm delivery  [O09.219] 10/31/2014  . Preterm delivery [O60.10X0] 10/31/2014  . Generalized anxiety disorder [F41.1] 10/31/2014  . History of marijuana use [Z87.898] 10/31/2014  . Cerebral palsy ([place]) [G80.9] 10/31/2014  . [redacted] weeks gestation of pregnancy [Z3A.35]   . Hx of PTL (preterm labor), current pregnancy [O09.219] 10/30/2014  . Preterm labor [O60.10X0] 10/30/2014  . Back pain complicating pregnancy in third trimester [O26.893, M54.9] 10/14/2014   History of Present Illness: 25 year old female, known to our service from prior psychiatric admissions, has been diagnosed with Bipolar Disorder, states " I have not been doing well", and reports worsening mood instability, severe  But brief mood swings, and suicidal ideations, with thoughts of cutting herself and thoughts of overdosing. Reports neuro-vegetative symptoms of depression as below .  Of note, states she had not slept well in several days prior to admission . States that recently  her children's father did not show up for court date ( for custody issues ) , and this was a major stressor . She asked a neighbor to bring her to the hospital due to worsening depression and suicidal ideations .  Of note, states that over recent days to weeks , she has been feeling nauseous, vomited several  times . States she stopped her psychiatric medications several weeks ago, partly due to nausea, but states " I don't think it was the medications , because nausea continued  in spite of not taking medications "  Associated Signs/Symptoms: Depression Symptoms:  depressed mood, anhedonia, insomnia, suicidal thoughts with specific plan, loss of energy/fatigue, decreased appetite,  Has lost 10 lbs  (Hypo) Manic Symptoms:  Some irritability Anxiety Symptoms:   States she has been experiencing some panic attacks and has been worrying excessively  Psychotic Symptoms:  States she has been seeing " people, shadows" at night time.  PTSD Symptoms: Denies  Total Time spent with patient: 45 minutes   Past Psychiatric History: Has had several psychiatric admissions for mood disorder and anxiety- most recently admitted 03/01/16.  She has been diagnosed with Bipolar Disorder. History of suicide ideations, denies history  of suicide attempts. (+)  history of self cutting , but not recently  Has a history of  panic attacks and excessive worrying, mainly relating to her children . Denies history of violence   Is the patient at risk to self? Yes.    Has the patient been a risk to self in the past 6 months? Yes.    Has the patient been a risk to self within the distant past? Yes.  Is the patient a risk to others? No.  Has the patient been a risk to others in the past 6 months? No.  Has the patient been a risk to others within the distant past? No.   Prior Inpatient Therapy:  as above  Prior Outpatient  Therapy:  [place]  Alcohol Screening: 1. How often do you have a drink containing alcohol?: Never 9. Have you or someone else been injured as a result of your drinking?: No 10. Has a relative or friend or a doctor or another health worker been concerned about your drinking or suggested you cut down?: No Alcohol Use Disorder Identification Test Final Score (AUDIT): 0 Brief Intervention: AUDIT score less than 7 or less-screening does not suggest unhealthy drinking-brief intervention not indicated Substance Abuse History in the last 12 months:  Denies alcohol or drug abuse, states she occasionally smokes cannabis, but not since May .  Consequences of Substance Abuse: Denies  Previous Psychotropic Medications: most recent medications were fanapt, neurontin, klonopin, and trazodone, as noted has been off these for several weeks . Psychological Evaluations: No  Past Medical History: denies other medical illnesses, NKDA. Past Medical History:  Diagnosis Date  . Anxiety   . Bipolar disorder ([place])   . 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 Depression   . Gestational diabetes     Past Surgical History:  Procedure Laterality Date  . CHOLECYSTECTOMY  2014  . HERNIA REPAIR     Family History:parents  divorced. Has a distant relationship with mother and no contact with father. Has two siblings - of note, her mother has custody of her children Family Psychiatric  History: Sister has been diagnosed with Bipolar Disorder, has an uncle who is alcohol dependent Tobacco Screening: does not smoke  Social History: single, has 4 children who are currently in the custody of her mother, facing legal issues, lives alone, no source of income.  History  Alcohol Use  . Yes    Comment: ocasionally, not with the pregnancy     History  Drug Use No    Comment: hx of marijuana use 3 weeks ago    Additional Social History:  Allergies:  No Known Allergies Lab Results:  Results  for orders placed or performed during the hospital encounter of 05/13/16 (from the past 48 hour(s))  Rapid urine drug screen (hospital performed)     Status: None   Collection Time: 05/13/16  2:00 AM  Result Value Ref Range   Opiates NONE DETECTED NONE DETECTED   Cocaine NONE DETECTED NONE DETECTED   Benzodiazepines NONE DETECTED NONE DETECTED   Amphetamines NONE DETECTED NONE DETECTED   Tetrahydrocannabinol NONE DETECTED NONE DETECTED   Barbiturates NONE DETECTED NONE DETECTED    Comment:        DRUG SCREEN FOR MEDICAL PURPOSES ONLY.  IF CONFIRMATION IS NEEDED FOR ANY PURPOSE, NOTIFY LAB WITHIN 5 DAYS.        LOWEST DETECTABLE LIMITS FOR URINE DRUG SCREEN Drug Class       Cutoff (ng/mL) Amphetamine      1000 Barbiturate      200 Benzodiazepine   505 Tricyclics       697 Opiates          300 Cocaine          300 THC              50   Comprehensive metabolic panel     Status: Abnormal  Collection Time: 05/13/16  2:10 AM  Result Value Ref Range   Sodium 138 135 - 145 mmol/L   Potassium 3.2 (L) 3.5 - 5.1 mmol/L   Chloride 105 101 - 111 mmol/L   CO2 25 22 - 32 mmol/L   Glucose, Bld 95 65 - 99 mg/dL   BUN 11 6 - 20 mg/dL   Creatinine, Ser 0.77 0.44 - 1.00 mg/dL   Calcium 9.6 8.9 - 10.3 mg/dL   Total Protein 8.0 6.5 - 8.1 g/dL   Albumin 5.1 (H) 3.5 - 5.0 g/dL   AST 19 15 - 41 U/L   ALT 23 14 - 54 U/L   Alkaline Phosphatase 98 38 - 126 U/L   Total Bilirubin 0.4 0.3 - 1.2 mg/dL   GFR calc non Af Amer >60 >60 mL/min   GFR calc Af Amer >60 >60 mL/min    Comment: (NOTE) The eGFR has been calculated using the CKD EPI equation. This calculation has not been validated in all clinical situations. eGFR's persistently <60 mL/min signify possible Chronic Kidney Disease.    Anion gap 8 5 - 15  Ethanol     Status: None   Collection Time: 05/13/16  2:10 AM  Result Value Ref Range   Alcohol, Ethyl (B) <5 <5 mg/dL    Comment:        LOWEST DETECTABLE LIMIT FOR SERUM ALCOHOL IS 5  mg/dL FOR MEDICAL PURPOSES ONLY   Salicylate level     Status: None   Collection Time: 05/13/16  2:10 AM  Result Value Ref Range   Salicylate Lvl <3.2 2.8 - 30.0 mg/dL  Acetaminophen level     Status: Abnormal   Collection Time: 05/13/16  2:10 AM  Result Value Ref Range   Acetaminophen (Tylenol), Serum <10 (L) 10 - 30 ug/mL    Comment:        THERAPEUTIC CONCENTRATIONS VARY SIGNIFICANTLY. A RANGE OF 10-30 ug/mL MAY BE AN EFFECTIVE CONCENTRATION FOR MANY PATIENTS. HOWEVER, SOME ARE BEST TREATED AT CONCENTRATIONS OUTSIDE THIS RANGE. ACETAMINOPHEN CONCENTRATIONS >150 ug/mL AT 4 HOURS AFTER INGESTION AND >50 ug/mL AT 12 HOURS AFTER INGESTION ARE OFTEN ASSOCIATED WITH TOXIC REACTIONS.   cbc     Status: None   Collection Time: 05/13/16  2:10 AM  Result Value Ref Range   WBC 8.3 4.0 - 10.5 K/uL   RBC 4.85 3.87 - 5.11 MIL/uL   Hemoglobin 13.8 12.0 - 15.0 g/dL   HCT 39.6 36.0 - 46.0 %   MCV 81.6 78.0 - 100.0 fL   MCH 28.5 26.0 - 34.0 pg   MCHC 34.8 30.0 - 36.0 g/dL   RDW 13.1 11.5 - 15.5 %   Platelets 275 150 - 400 K/uL  I-Stat beta hCG blood, ED     Status: None   Collection Time: 05/13/16  2:21 AM  Result Value Ref Range   I-stat hCG, quantitative <5.0 <5 mIU/mL   Comment 3            Comment:   GEST. AGE      CONC.  (mIU/mL)   <=1 WEEK        5 - 50     2 WEEKS       50 - 500     3 WEEKS       100 - 10,000     4 WEEKS     1,000 - 30,000        FEMALE AND NON-PREGNANT FEMALE:     LESS  THAN 5 mIU/mL     Blood Alcohol level:  Lab Results  Component Value Date   [facility] <5 05/13/2016   ETH <5 49/70/2637    Metabolic Disorder Labs:  Lab Results  Component Value Date   HGBA1C 5.6 02/04/2016   MPG 114 02/04/2016   Lab Results  Component Value Date   PROLACTIN 32.8 (H) 02/04/2016   Lab Results  Component Value Date   CHOL 139 02/04/2016   TRIG 86 02/04/2016   HDL 53 02/04/2016   CHOLHDL 2.6 02/04/2016   VLDL 17 02/04/2016   LDLCALC 69 02/04/2016    Current  Medications: Current Facility-Administered Medications  Medication Dose Route Frequency Provider Last Rate Last Dose  . acetaminophen (TYLENOL) tablet 650 mg  650 mg Oral Q4H PRN Lurena Nida, NP      . ondansetron [facility]) tablet 4 mg  4 mg Oral Q8H PRN Lurena Nida, NP   4 mg at 05/13/16 1209  . potassium chloride SA (K-DUR,KLOR-CON) CR tablet 40 mEq  40 mEq Oral BID Lurena Nida, NP      . QUEtiapine (SEROQUEL) tablet 50 mg  50 mg Oral QHS PRN,MR X 1 Lurena Nida, NP       PTA Medications: Prescriptions Prior to Admission  Medication Sig Dispense Refill Last Dose  . clonazePAM (KLONOPIN) 0.5 MG tablet Take 1 tablet (0.5 mg total) by mouth 3 (three) times daily as needed (anxiety/ insomnia). 30 tablet 0 Past Week at Unknown time  . gabapentin (NEURONTIN) 100 MG capsule Take 2 capsules (200 mg total) by mouth 3 (three) times daily. 180 capsule 0 Past Week at Unknown time  . iloperidone (FANAPT) 4 MG TABS tablet Take 0.5 tablets (2 mg total) by mouth 2 (two) times daily. 30 tablet 0 Past Week at Unknown time  . traZODone (DESYREL) 100 MG tablet Take 1 tablet (100 mg total) by mouth at bedtime as needed for sleep. (Patient not taking: Reported on 05/13/2016) 30 tablet 0 Not Taking at Unknown time    Musculoskeletal: Strength & Muscle Tone: within normal limits Gait & Station: normal Patient leans: N/A  Psychiatric Specialty Exam: Physical Exam  Review of Systems  Constitutional: Negative for chills and fever.  Eyes: Negative.   Respiratory: Negative.   Cardiovascular: Negative.   Gastrointestinal: Positive for nausea and vomiting. Negative for blood in stool, diarrhea and heartburn.  Genitourinary: Positive for dysuria.  Musculoskeletal: Negative.   Skin: Negative.   Neurological: Positive for headaches. Negative for seizures.  Psychiatric/Behavioral: Positive for depression and suicidal ideas.  All other systems reviewed and are negative.   Blood pressure 96/72, pulse 100,  temperature 98.7 F (37.1 C), temperature source Oral, resp. rate 16, height _0  (1.6 m), weight 125 lb (56.7 kg), last menstrual period 04/29/2016, SpO2 100 %, not currently breastfeeding.Body mass index is 22.14 kg/m.  General Appearance: Fairly Groomed  Eye Contact:  Fair  Speech:  Normal Rate  Volume:  Decreased  Mood:  Depressed  Affect:  mildly constricted, sad , anxious   Thought Process:  Linear  Orientation:  Full (Time, Place, and Person)  Thought Content:  no current hallucinations, no delusions , states she has been experiencing fleeting visual hallucinations at night time   Suicidal Thoughts:  Some passive thoughts of death, dying , but denies any suicidal or self injurious ideations and contracts for safety on the unit  Homicidal Thoughts:   Denies any homicidal ideations  Memory:  recent and remote grossly  intact   Judgement:  Fair  Insight:  Fair  Psychomotor Activity:  Normal  Concentration:  Concentration: Good and Attention Span: Good  Recall:  Good  Fund of Knowledge:  Good  Language:  Good  Akathisia:  Negative  Handed:  Right  AIMS (if indicated):     Assets:  Desire for Improvement Resilience  ADL's:  Fair   Cognition:  WNL  Sleep:          Treatment Plan Summary: Daily contact with patient to assess and evaluate symptoms and progress in treatment, Medication management, Plan inpatient treatment  and medications as below  Observation Level/Precautions:  15 minute checks  Laboratory:  as needed   Psychotherapy:  Milieu, support   Medications:  We discussed options, and have decided not to restart Fanapt as patient feels it was causing dizziness and nausea. Has  Tolerated Seroquel well in the past, and may help her sleep better and regain weight. Start 150 mgrs QHS .  Consultations:  As needed   Discharge Concerns:  -   Estimated LOS:  5-6 days   Other:     I certify that inpatient services furnished can reasonably be expected to improve the  patient's condition.    Neita Garnet, MD 8/10/20173:01 PM

## 2016-05-13 NOTE — ED Triage Notes (Addendum)
Patient presents to ED via POV wanting medical clearance because she states she is having a manic episode/bipolar disorder.  She states she wants to kill herself but she knows her behavior is off because she has been off her medications for one month because they are making her nauseous.

## 2016-05-13 NOTE — B[facility] Counselor (Signed)
[facility], Berneice Heinrichina Tate, [facility], RN will coordinate time of admission. Writer communicated with Erin SonsLatrisha London, RN of pt's bed status.   Gwinda Passereylese D Bennett, MS, [facility], [facility] Triage Specialist [phone]

## 2016-05-14 ENCOUNTER — Encounter (HOSPITAL_COMMUNITY): Payer: Self-pay | Admitting: Nurse Practitioner

## 2016-05-14 LAB — LIPID PANEL
CHOLESTEROL: 135 mg/dL (ref 0–200)
HDL: 58 mg/dL (ref >40)
LDL CALC: 65 mg/dL (ref 0–99)
TRIGLYCERIDES: 61 mg/dL (ref <150)
Total CHOL/HDL Ratio: 2.3 RATIO
VLDL: 12 mg/dL (ref 0–40)

## 2016-05-14 MED ORDER — CLONAZEPAM 0.5 MG PO TABS
ORAL_TABLET | ORAL | Status: AC
  Filled 2016-05-14: qty 1

## 2016-05-14 MED ORDER — BOOST / RESOURCE BREEZE PO LIQD
1.0000 | ORAL | Status: DC
  Administered 2016-05-14 – 2016-05-16 (×3): 1 via ORAL
  Administered 2016-05-17: 237 mL via ORAL
  Administered 2016-05-18 – 2016-05-20 (×3): 1 via ORAL
  Filled 2016-05-14 (×8): qty 1

## 2016-05-14 MED ORDER — CLONAZEPAM 0.5 MG PO TABS
0.5000 mg | ORAL_TABLET | Freq: Once | ORAL | Status: AC
  Administered 2016-05-14: 0.5 mg via ORAL

## 2016-05-14 MED ORDER — CLONAZEPAM 0.5 MG PO TABS
0.5000 mg | ORAL_TABLET | Freq: Three times a day (TID) | ORAL | Status: DC | PRN
  Administered 2016-05-14 – 2016-05-21 (×12): 0.5 mg via ORAL
  Filled 2016-05-14 (×12): qty 1

## 2016-05-14 MED ORDER — GABAPENTIN 300 MG PO CAPS
300.0000 mg | ORAL_CAPSULE | Freq: Three times a day (TID) | ORAL | Status: DC
  Administered 2016-05-15 – 2016-05-16 (×5): 300 mg via ORAL
  Filled 2016-05-14 (×11): qty 1

## 2016-05-14 MED ORDER — POTASSIUM CHLORIDE 20 MEQ/15ML (10%) PO SOLN
40.0000 meq | Freq: Two times a day (BID) | ORAL | Status: AC
  Filled 2016-05-14 (×2): qty 30

## 2016-05-14 NOTE — Progress Notes (Signed)
NUTRITION ASSESSMENT  Pt identified as at risk on the Malnutrition Screen Tool  INTERVENTION: 1. Educated patient on the importance of nutrition and encouraged intake of food and beverages. 2. Discussed weight goals. 3. Supplements: will change supplement order to provide Boost Breeze once/day, this supplement provides 250 kcal and 9 grams of protein.   NUTRITION DIAGNOSIS: Unintentional weight loss related to sub-optimal intake as evidenced by pt report.   Goal: Pt to meet >/= 90% of their estimated nutrition needs.  Monitor:  PO intake  Assessment:  Pt admitted for bipolar disorder with feelings of SI. She has been off of her medications for the past 1 month; she sometimes feels sick when she takes her medications. She has begun to experience visual hallucinations.   She is unsure of weight hx PTA. Per chart review, pt has lost 3 lbs (2.3% body weight) in the past 3 months which is not significant for time frame. She has also lost 17 lbs (12% body weight) in the past 10 months which is also not significant for time frame.   Will d/c Ensure Enlive order and order Boost Breeze once/day. Continue to encourage PO intakes of meals and snacks.  25 y.o. female  Height: Ht Readings from Last 1 Encounters:  05/13/16 5\' 3"  (1.6 m)    Weight: Wt Readings from Last 1 Encounters:  05/13/16 125 lb (56.7 kg)    Weight Hx: Wt Readings from Last 10 Encounters:  05/13/16 125 lb (56.7 kg)  03/01/16 128 lb (58.1 kg)  02/29/16 125 lb (56.7 kg)  02/02/16 119 lb (54 kg)  01/14/16 120 lb (54.4 kg)  07/24/15 142 lb (64.4 kg)  10/30/14 152 lb (68.9 kg)  10/14/14 145 lb (65.8 kg)  07/31/14 136 lb 8 oz (61.9 kg)  07/23/14 132 lb 4 oz (60 kg)    BMI:  Body mass index is 22.14 kg/m. Pt meets criteria for normal weight based on current BMI.  Estimated Nutritional Needs: Kcal: 25-30 kcal/kg Protein: > 1 gram protein/kg Fluid: 1 ml/kcal  Diet Order: Diet regular Room service appropriate?  Yes; Fluid consistency: Thin Pt is also offered choice of unit snacks mid-morning and mid-afternoon.  Pt is eating as desired.   Lab results and medications reviewed.     Trenton GammonJessica Eron Goble, MS, RD, LDN Inpatient Clinical Dietitian Pager # [phone] After hours/weekend pager # [phone]

## 2016-05-14 NOTE — Progress Notes (Signed)
Patient spent most of AM in bed, refusing to go to groups. Patient did get up for lunch and dinner, and staff observed her eat a small amount. Patient to the contrary reported, "I haven't been eating or drinking anything".  Patient had to be reminded multiple times throughout late morning and into the afternoon, to not go into the other halls common room. Patient noted with another patient making each other anxious and staff overheard then talking about the medications they each were taking and how to get staff to give you, "Something stronger". Shortly after this conversation, Patient reported to this writer that she was, "Having a panic attack. I'm going to lose my mind and I can feel myself getting really manic, right now". NP was notified of patient's conversation with peer and her increasing agitation. Medication ordered for anxiety. Patient was later noted tell same peer, "That stuff they gave me made me so sleepy, I think I have to go lay down before dinner". Patient noted slurring her words with peer, but when addressing staff only minutes later, no slurring noted in speech.  Poor personal hygiene is poor, with body odor. Mood is alternately, demanding, childlike, argumentative and anxious. Patient has refused her potassium x 3 this shift. It taking other medications as prescribed.  Education provided on indications and side effects of medications. 15 minute checks done through out shift for safety.

## 2016-05-14 NOTE — Tx Team (Signed)
Initial Interdisciplinary Treatment Plan   PATIENT STRESSORS: Legal issue Loss of custody of children Marital or family conflict Medication change or noncompliance   PATIENT STRENGTHS: Ability for insight Communication skills   PROBLEM LIST: Problem List/Patient Goals Date to be addressed Date deferred Reason deferred Estimated date of resolution  "found out my boyfriend cheated on me"  05/14/16     "i'm coming out of my manic phase now" 05/14/16     depression 05/14/16     SI 05/14/16     Medication non-compliance due to physical symptoms 05/14/16                              DISCHARGE CRITERIA:  Ability to meet basic life and health needs Improved stabilization in mood, thinking, and/or behavior Safe-care adequate arrangements made Verbal commitment to aftercare and medication compliance  PRELIMINARY DISCHARGE PLAN: Outpatient therapy Placement in alternative living arrangements  PATIENT/FAMIILY INVOLVEMENT: This treatment plan has been presented to and reviewed with the patient, Margrett RudVictoria L Bucaro .The patient and family have been given the opportunity to ask questions and make suggestions.  Aurora Maskwyman, Sherrilyn Nairn E 05/14/2016, 3:26 AM

## 2016-05-14 NOTE — [facility] Group Notes (Signed)
[facility]  05/14/2016 8:45 AM  Pt did not attend, declined invitation.   Chad CordialLauren Carter, LCSWA 05/14/2016 9:33 AM

## 2016-05-14 NOTE — Progress Notes (Signed)
Patient attended karaoke group but didn't participate.

## 2016-05-14 NOTE — Progress Notes (Signed)
Recreation Therapy Notes  Date: 05/14/16 Time: 930 Location: 300 Hall Group Room  Group Topic: Stress Management  Goal Area(s) Addresses:  Patient will verbalize importance of using healthy stress management.  Patient will identify positive emotions associated with healthy stress management.   Intervention: Guided Imagery Script  Activity :  Peaceful Place Script.  LRT introduced the technique of guided imagery to patients.  Patients were to follow along as LRT read script so they could participate in activity.  Education:  Stress Management, Discharge Planning.   Education Outcome: Needs additional education  Clinical Observations/Feedback: Pt did not attend group.    Arline Ketter, LRT/CTRS  

## 2016-05-14 NOTE — [facility] Group Notes (Signed)
[facility] LCSW Group Therapy 05/14/2016 1:15pm  Type of Therapy: Group Therapy- Feelings Around Relapse and Recovery  Participation Level: Active   Participation Quality:  Appropriate  Affect:  Appropriate  Cognitive: Alert and Oriented   Insight:  Developing   Engagement in Therapy: Developing/Improving and Engaged   Modes of Intervention: Clarification, Confrontation, Discussion, Education, Exploration, Limit-setting, Orientation, Problem-solving, Rapport Building, Dance movement psychotherapisteality Testing, Socialization and Support  Summary of Progress/Problems: The topic for today was feelings about relapse. The group discussed what relapse prevention is to them and identified triggers that they are on the path to relapse. Members also processed their feeling towards relapse and were able to relate to common experiences. Group also discussed coping skills that can be used for relapse prevention.  Pt expresses that she is aware of warning signs related to relapse and is able to identify impacts of her multiple relapses.    Therapeutic Modalities:   Cognitive Behavioral Therapy Solution-Focused Therapy Assertiveness Training Relapse Prevention Therapy    Caitlin SprinklesLauren Carter, LCSWA [phone] 05/14/2016 3:59 PM

## 2016-05-14 NOTE — Progress Notes (Signed)
[facility] MD Progress Note  05/14/2016 4:21 PM Caitlin Bell  MRN:  [id number] Subjective:  Patient c/o panic attack.  Patient was shaking and reported stressors of speaking with mother and custody issues of her children.   Objective:  Caitlin Bell, 25 yo well known to [facility], hx Bipolar do.  Her depression worsened.  Her court date did not go well and she still has issues with custody of her children.  This gives her tremendous anxiety.  Discussed how to avoid triggers of her anxiety.  She denies SI today.  She is concerned that her Klonopin was not restarted.  Discussed with Dr Jama Flavors that patient was beginning to be agitated and could use PRN dosing.    Principal Problem: Bipolar I disorder, most recent episode depressed, severe w psychosis (HCC) Diagnosis:   Patient Active Problem List   Diagnosis Date Noted  . Bipolar I disorder, most recent episode depressed, severe w psychosis (HCC) [F31.5] 05/13/2016    Priority: High  . Affective psychosis, bipolar (HCC) [F31.9]   . Bipolar affective disorder, depressed, severe (HCC) [F31.4] 03/01/2016  . Bipolar affective disorder, current episode severe (HCC) [F31.9] 03/01/2016  . Bipolar affective disorder, depressed, severe, with psychotic behavior (HCC) [F31.5] 02/02/2016  . Normal labor [O80, Z37.9] 10/31/2014  . Bipolar disorder (HCC) [F31.9] 10/31/2014  . Cholestasis of pregnancy [O26.619, K83.1] 10/31/2014  . H/O anorexia nervosa [Z86.59] 10/31/2014  . H/O bulimia nervosa [Z86.59] 10/31/2014  . Gestational diabetes [O24.419] 10/31/2014  . Depression--on Latuda [F32.9] 10/31/2014  . Umbilical hernia [K42.9] 10/31/2014  . Hx of preterm delivery  [O09.219] 10/31/2014  . Preterm delivery [O60.10X0] 10/31/2014  . Generalized anxiety disorder [F41.1] 10/31/2014  . History of marijuana use [Z87.898] 10/31/2014  . Cerebral palsy (HCC) [G80.9] 10/31/2014  . [redacted] weeks gestation of pregnancy [Z3A.35]   . Hx of PTL (preterm labor), current pregnancy  [O09.219] 10/30/2014  . Preterm labor [O60.10X0] 10/30/2014  . Back pain complicating pregnancy in third trimester [O26.893, M54.9] 10/14/2014   Total Time spent with patient: 30 minutes  Past Psychiatric History: see HPI  Past Medical History:  Past Medical History:  Diagnosis Date  . Anxiety   . Bipolar disorder (HCC)   . 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 Depression   . Gestational diabetes     Past Surgical History:  Procedure Laterality Date  . CHOLECYSTECTOMY  2014  . HERNIA REPAIR     Family History: History reviewed. No pertinent family history. Family Psychiatric  History: see HPI Social History:  History  Alcohol Use  . Yes    Comment: ocasionally, not with the pregnancy     History  Drug Use No    Comment: hx of marijuana use 3 weeks ago    Social History   Social History  . Marital status: Single    Spouse name: N/A  . Number of children: N/A  . Years of education: N/A   Social History Main Topics  . Smoking status: Former Smoker    Quit date: 10/31/2011  . Smokeless tobacco: Never Used  . Alcohol use Yes     Comment: ocasionally, not with the pregnancy  . Drug use: No     Comment: hx of marijuana use 3 weeks ago  . Sexual activity: Yes    Birth control/ protection: None   Other Topics Concern  . None   Social History Narrative  . None   Additional Social History:     Sleep:  Good  Appetite:  Good  Current Medications: Current Facility-Administered Medications  Medication Dose Route Frequency Provider Last Rate Last Dose  . acetaminophen (TYLENOL) tablet 650 mg  650 mg Oral Q4H PRN Kristeen Mans, NP   650 mg at 05/13/16 2220  . clonazePAM (KLONOPIN) 0.5 MG tablet           . clonazePAM (KLONOPIN) tablet 0.5 mg  0.5 mg Oral TID PRN Adonis Brook, NP      . feeding supplement (BOOST / RESOURCE BREEZE) liquid 1 Container  1 Container Oral Q24H Sylvie Mifsud A Tamsen Reist, MD      . gabapentin (NEURONTIN) capsule 200 mg  200 mg  Oral TID Kristeen Mans, NP   200 mg at 05/14/16 1308  . ondansetron (ZOFRAN) tablet 4 mg  4 mg Oral Q8H PRN Kristeen Mans, NP   4 mg at 05/13/16 1209  . potassium chloride 20 MEQ/15ML (10%) solution 40 mEq  40 mEq Oral BID Craige Cotta, MD      . QUEtiapine (SEROQUEL) tablet 150 mg  150 mg Oral QHS Craige Cotta, MD   150 mg at 05/13/16 2132    Lab Results:  Results for orders placed or performed during the hospital encounter of 05/13/16 (from the past 48 hour(s))  Lipid panel, fasting     Status: None   Collection Time: 05/14/16  6:34 AM  Result Value Ref Range   Cholesterol 135 0 - 200 mg/dL   Triglycerides 61 <161 mg/dL   HDL 58 >09 mg/dL   Total CHOL/HDL Ratio 2.3 RATIO   VLDL 12 0 - 40 mg/dL   LDL Cholesterol 65 0 - 99 mg/dL    Comment:        Total Cholesterol/HDL:CHD Risk Coronary Heart Disease Risk Table                     Men   Women  1/2 Average Risk   3.4   3.3  Average Risk       5.0   4.4  2 X Average Risk   9.6   7.1  3 X Average Risk  23.4   11.0        Use the calculated Patient Ratio above and the CHD Risk Table to determine the patient's CHD Risk.        ATP III CLASSIFICATION (LDL):  <100     mg/dL   Optimal  604-540  mg/dL   Near or Above                    Optimal  130-159  mg/dL   Borderline  981-191  mg/dL   High  >478     mg/dL   Very High Performed at [facility]     Blood Alcohol level:  Lab Results  Component Value Date   [facility] <5 05/13/2016   ETH <5 02/29/2016    Metabolic Disorder Labs: Lab Results  Component Value Date   HGBA1C 5.6 02/04/2016   MPG 114 02/04/2016   Lab Results  Component Value Date   PROLACTIN 32.8 (H) 02/04/2016   Lab Results  Component Value Date   CHOL 135 05/14/2016   TRIG 61 05/14/2016   HDL 58 05/14/2016   CHOLHDL 2.3 05/14/2016   VLDL 12 05/14/2016   LDLCALC 65 05/14/2016   LDLCALC 69 02/04/2016    Physical Findings: AIMS: Facial and Oral Movements Muscles of Facial Expression:  None, normal  Lips and Perioral Area: None, normal Jaw: None, normal Tongue: None, normal,Extremity Movements Upper (arms, wrists, hands, fingers): None, normal Lower (legs, knees, ankles, toes): None, normal, Trunk Movements Neck, shoulders, hips: None, normal, Overall Severity Severity of abnormal movements (highest score from questions above): None, normal Incapacitation due to abnormal movements: None, normal Patient's awareness of abnormal movements (rate only patient's report): No Awareness, Dental Status Current problems with teeth and/or dentures?: No Does patient usually wear dentures?: No  CIWA:    COWS:     Musculoskeletal: Strength & Muscle Tone: within normal limits Gait & Station: normal Patient leans: N/A  Psychiatric Specialty Exam: Physical Exam  Nursing note and vitals reviewed. Constitutional: She appears well-developed.  HENT:  Head: Normocephalic.  Eyes: Pupils are equal, round, and reactive to light.  Neck: Normal range of motion.  Cardiovascular: Normal rate.   Respiratory: Effort normal.  GI: Soft.  Genitourinary: Vagina normal.  Musculoskeletal: Normal range of motion.  Neurological: She is alert.  Skin: Skin is warm.  Psychiatric: She has a normal mood and affect. Her speech is normal and behavior is normal. Judgment and thought content normal. Cognition and memory are normal.    Review of Systems  Constitutional: Negative.   HENT: Negative.   Eyes: Negative.   Respiratory: Negative.   Cardiovascular: Negative.   Gastrointestinal: Negative.   Genitourinary: Negative.   Musculoskeletal: Negative.   Skin: Negative.   Neurological: Negative.   Endo/Heme/Allergies: Negative.   Psychiatric/Behavioral: Negative.   All other systems reviewed and are negative.   Blood pressure 110/73, pulse 98, temperature 98.7 F (37.1 C), temperature source Oral, resp. rate 16, height 5\' 3"  (1.6 m), weight 56.7 kg (125 lb), last menstrual period 04/29/2016, SpO2  100 %, not currently breastfeeding.Body mass index is 22.14 kg/m.  General Appearance: Fairly Groomed  Eye Contact:  Good  Speech:  Clear and Coherent  Volume:  Normal  Mood:  Anxious and Depressed  Affect:  Congruent  Thought Process:  Coherent  Orientation:  Full (Time, Place, and Person)  Thought Content:  Rumination  Suicidal Thoughts:  No  Homicidal Thoughts:  No  Memory:  Immediate;   Good Recent;   Good Remote;   Good  Judgement:  Good  Insight:  Good  Psychomotor Activity:  Normal  Concentration:  Concentration: Good and Attention Span: Good  Recall:  Good  Fund of Knowledge:  Good  Language:  Good  Akathisia:  Negative  Handed:  Right  AIMS (if indicated):     Assets:  Physical Health  ADL's:  Intact  Cognition:  WNL  Sleep:  Number of Hours: 3.75   Treatment Plan Summary: Review of chart, vital signs, medications, and notes.  1-Individual and group therapy  2-Medication management for depression and anxiety: Medications reviewed with the patient.  Klonopin 0.5 mg Q8H PRN anxiety, Gabapentin 200 mg TID anxiety, Seroquel 150 mg HS mood control  3-Coping skills for depression, anxiety  4-Continue crisis stabilization and management  5-Address health issues--monitoring vital signs, stable  6-Treatment plan in progress to prevent relapse of depression and anxiety  Sheila May Agustin, NP [facility] 05/14/2016, 4:21 PM   Agree with   NP progress note

## 2016-05-14 NOTE — Progress Notes (Signed)
Adult Psychoeducational Group Note  Date:  05/14/2016 Time:  9:00 PM  Group Topic/Focus:  Wrap-Up Group:   The focus of this group is to help patients review their daily goal of treatment and discuss progress on daily workbooks.   Participation Level:  Active  Participation Quality:  Appropriate  Affect:  Appropriate  Cognitive:  Appropriate  Insight: Appropriate  Engagement in Group:  Engaged  Modes of Intervention:  Discussion  Additional Comments:  Pt rated her overall day a 5 out of 10 and stated that her day was "good" because she got to go outside today. Pt reported that she achieved her goal for the day, which was to try to eat something.    Cleotilde NeerJasmine S Maelyn Berrey 05/14/2016, 8:57 PM

## 2016-05-15 ENCOUNTER — Encounter (HOSPITAL_COMMUNITY): Payer: Self-pay | Admitting: Behavioral Health

## 2016-05-15 LAB — HEMOGLOBIN A1C
Hgb A1c MFr Bld: 5.2 % (ref 4.8–5.6)
Mean Plasma Glucose: 103 mg/dL

## 2016-05-15 NOTE — Progress Notes (Signed)
Data. Patient denies SI/HI/AVH.  Patient interacting well with staff and other patients. Patient reports 5/10 for anxiety, depression and anxiety. Her goal today is: "Eat". Action. Emotional support and encouragement offered. Education provided on medication, indications and side effect. Q 15 minute checks done for safety. Response. Safety on the unit maintained through 15 minute checks.  Medications taken as prescribed. Attended groups. Remained calm and appropriate through out shift.

## 2016-05-15 NOTE — Progress Notes (Signed)
Patient ID: [name], female   DOB: [birth year], 25 y.o.   MRN: [id number] [facility] MD Progress Note  05/15/2016 12:06 PM KAITLINN IVERSEN  MRN:  [id number]  Subjective:  "Things are going ok. I am feeling a lot better."    Objective: Chart reviewed and patient evaluated by this provider for follow-up assessment. Patient is alert, oriented x4, calm, and cooperative during this evaluation.   Dalary Hollar presents to [facility] for worsening depression and hx of Bipolar. At current, [place] denies active or passive suicidal ideations with plan or intent, homicidal ideations, AVH, or urges to engage in self harming behaviors. She does continue to endorse some depression and anxiety yet she report overall improvement in both. She remains complaint with current medications reporting they are well tolerated without adverse events. She remains active in group with no behavioral issues or agitated noted or reported. At current, she is able to contract for safety while on the unit.   Principal Problem: Bipolar I disorder, most recent episode depressed, severe w psychosis (HCC) Diagnosis:   Patient Active Problem List   Diagnosis Date Noted  . Bipolar I disorder, most recent episode depressed, severe w psychosis (HCC) [F31.5] 05/13/2016  . Affective psychosis, bipolar (HCC) [F31.9]   . Bipolar affective disorder, depressed, severe (HCC) [F31.4] 03/01/2016  . Bipolar affective disorder, current episode severe (HCC) [F31.9] 03/01/2016  . Bipolar affective disorder, depressed, severe, with psychotic behavior (HCC) [F31.5] 02/02/2016  . Normal labor [O80, Z37.9] 10/31/2014  . Bipolar disorder (HCC) [F31.9] 10/31/2014  . Cholestasis of pregnancy [O26.619, K83.1] 10/31/2014  . H/O anorexia nervosa [Z86.59] 10/31/2014  . H/O bulimia nervosa [Z86.59] 10/31/2014  . Gestational diabetes [O24.419] 10/31/2014  . Depression--on Latuda [F32.9] 10/31/2014  . Umbilical hernia [K42.9] 10/31/2014  . Hx of preterm  delivery  [O09.219] 10/31/2014  . Preterm delivery [O60.10X0] 10/31/2014  . Generalized anxiety disorder [F41.1] 10/31/2014  . History of marijuana use [Z87.898] 10/31/2014  . Cerebral palsy (HCC) [G80.9] 10/31/2014  . [redacted] weeks gestation of pregnancy [Z3A.35]   . Hx of PTL (preterm labor), current pregnancy [O09.219] 10/30/2014  . Preterm labor [O60.10X0] 10/30/2014  . Back pain complicating pregnancy in third trimester [O26.893, M54.9] 10/14/2014   Total Time spent with patient: 15 minutes  Past Psychiatric History: see HPI  Past Medical History:  Past Medical History:  Diagnosis Date  . Anxiety   . Bipolar disorder (HCC)   . 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 Depression   . Gestational diabetes     Past Surgical History:  Procedure Laterality Date  . CHOLECYSTECTOMY  2014  . HERNIA REPAIR     Family History: History reviewed. No pertinent family history. Family Psychiatric  History: see HPI Social History:  History  Alcohol Use  . Yes    Comment: ocasionally, not with the pregnancy     History  Drug Use No    Comment: hx of marijuana use 3 weeks ago    Social History   Social History  . Marital status: Single    Spouse name: N/A  . Number of children: N/A  . Years of education: N/A   Social History Main Topics  . Smoking status: Former Smoker    Quit date: 10/31/2011  . Smokeless tobacco: Never Used  . Alcohol use Yes     Comment: ocasionally, not with the pregnancy  . Drug use: No     Comment: hx of marijuana use 3 weeks ago  .  Sexual activity: Yes    Birth control/ protection: None   Other Topics Concern  . None   Social History Narrative  . None   Additional Social History:     Sleep: Good  Appetite:  Good  Current Medications: Current Facility-Administered Medications  Medication Dose Route Frequency Provider Last Rate Last Dose  . acetaminophen (TYLENOL) tablet 650 mg  650 mg Oral Q4H PRN Kristeen Mans, NP   650 mg at  05/13/16 2220  . clonazePAM (KLONOPIN) tablet 0.5 mg  0.5 mg Oral TID PRN Adonis Brook, NP   0.5 mg at 05/14/16 2139  . feeding supplement (BOOST / RESOURCE BREEZE) liquid 1 Container  1 Container Oral Q24H Craige Cotta, MD   1 Container at 05/14/16 2016  . gabapentin (NEURONTIN) capsule 300 mg  300 mg Oral TID Adonis Brook, NP   300 mg at 05/15/16 1040  . ondansetron (ZOFRAN) tablet 4 mg  4 mg Oral Q8H PRN Kristeen Mans, NP   4 mg at 05/13/16 1209  . QUEtiapine (SEROQUEL) tablet 150 mg  150 mg Oral QHS Craige Cotta, MD   150 mg at 05/14/16 2133    Lab Results:  Results for orders placed or performed during the hospital encounter of 05/13/16 (from the past 48 hour(s))  Hemoglobin A1c     Status: None   Collection Time: 05/14/16  6:34 AM  Result Value Ref Range   Hgb A1c MFr Bld 5.2 4.8 - 5.6 %    Comment: (NOTE)         Pre-diabetes: 5.7 - 6.4         Diabetes: >6.4         Glycemic control for adults with diabetes: <7.0    Mean Plasma Glucose 103 mg/dL    Comment: (NOTE) Performed At: [facility] [address] [place], [place] [id number] Mila Homer MD WU:[id number] Performed at [facility]   Lipid panel, fasting     Status: None   Collection Time: 05/14/16  6:34 AM  Result Value Ref Range   Cholesterol 135 0 - 200 mg/dL   Triglycerides 61 <956 mg/dL   HDL 58 >21 mg/dL   Total CHOL/HDL Ratio 2.3 RATIO   VLDL 12 0 - 40 mg/dL   LDL Cholesterol 65 0 - 99 mg/dL    Comment:        Total Cholesterol/HDL:CHD Risk Coronary Heart Disease Risk Table                     Men   Women  1/2 Average Risk   3.4   3.3  Average Risk       5.0   4.4  2 X Average Risk   9.6   7.1  3 X Average Risk  23.4   11.0        Use the calculated Patient Ratio above and the CHD Risk Table to determine the patient's CHD Risk.        ATP III CLASSIFICATION (LDL):  <100     mg/dL   Optimal  308-657  mg/dL   Near or Above                    Optimal   130-159  mg/dL   Borderline  846-962  mg/dL   High  >952     mg/dL   Very High Performed at [facility]     Blood Alcohol  level:  Lab Results  Component Value Date   ETH <5 05/13/2016   ETH <5 02/29/2016    Metabolic Disorder Labs: Lab Results  Component Value Date   HGBA1C 5.2 05/14/2016   MPG 103 05/14/2016   MPG 114 02/04/2016   Lab Results  Component Value Date   PROLACTIN 32.8 (H) 02/04/2016   Lab Results  Component Value Date   CHOL 135 05/14/2016   TRIG 61 05/14/2016   HDL 58 05/14/2016   CHOLHDL 2.3 05/14/2016   VLDL 12 05/14/2016   LDLCALC 65 05/14/2016   LDLCALC 69 02/04/2016    Physical Findings: AIMS: Facial and Oral Movements Muscles of Facial Expression: None, normal Lips and Perioral Area: None, normal Jaw: None, normal Tongue: None, normal,Extremity Movements Upper (arms, wrists, hands, fingers): None, normal Lower (legs, knees, ankles, toes): None, normal, Trunk Movements Neck, shoulders, hips: None, normal, Overall Severity Severity of abnormal movements (highest score from questions above): None, normal Incapacitation due to abnormal movements: None, normal Patient's awareness of abnormal movements (rate only patient's report): No Awareness, Dental Status Current problems with teeth and/or dentures?: No Does patient usually wear dentures?: No  CIWA:    COWS:     Musculoskeletal: Strength & Muscle Tone: within normal limits Gait & Station: normal Patient leans: N/A  Psychiatric Specialty Exam: Physical Exam  Nursing note and vitals reviewed. Constitutional: She appears well-developed.  HENT:  Head: Normocephalic.  Eyes: Pupils are equal, round, and reactive to light.  Neck: Normal range of motion.  Cardiovascular: Normal rate.   Respiratory: Effort normal.  GI: Soft.  Genitourinary: Vagina normal.  Musculoskeletal: Normal range of motion.  Neurological: She is alert.  Skin: Skin is warm.  Psychiatric: She has a normal  mood and affect. Her speech is normal and behavior is normal. Judgment and thought content normal. Cognition and memory are normal.    Review of Systems  Constitutional: Negative.   HENT: Negative.   Eyes: Negative.   Respiratory: Negative.   Cardiovascular: Negative.   Gastrointestinal: Negative.   Genitourinary: Negative.   Musculoskeletal: Negative.   Skin: Negative.   Neurological: Negative.   Endo/Heme/Allergies: Negative.   Psychiatric/Behavioral: Positive for depression. Negative for hallucinations, memory loss, substance abuse and suicidal ideas. The patient is nervous/anxious. The patient does not have insomnia.   All other systems reviewed and are negative.   Blood pressure (!) 89/63, pulse (!) 113, temperature 98.4 F (36.9 C), resp. rate 16, height 5\' 3"  (1.6 m), weight 56.7 kg (125 lb), last menstrual period 04/29/2016, SpO2 100 %, not currently breastfeeding.Body mass index is 22.14 kg/m.  General Appearance: Fairly Groomed  Eye Contact:  Good  Speech:  Clear and Coherent  Volume:  Normal  Mood:  Anxious and Depressed  Affect:  Congruent  Thought Process:  Coherent  Orientation:  Full (Time, Place, and Person)  Thought Content:  Rumination  Suicidal Thoughts:  No  Homicidal Thoughts:  No  Memory:  Immediate;   Good Recent;   Good Remote;   Good  Judgement:  Good  Insight:  Good  Psychomotor Activity:  Normal  Concentration:  Concentration: Good and Attention Span: Good  Recall:  Good  Fund of Knowledge:  Good  Language:  Good  Akathisia:  Negative  Handed:  Right  AIMS (if indicated):     Assets:  Physical Health  ADL's:  Intact  Cognition:  WNL  Sleep:  Number of Hours: 6   Treatment Plan Summary: Review of chart, vital signs,  medications, and notes.  1-Individual and group therapy  2-Medication management for depression and anxiety: Medications reviewed with the patient.  Klonopin 0.5 mg Q8H PRN anxiety, Gabapentin 200 mg TID anxiety, Seroquel 150  mg HS mood control  3-Coping skills for depression, anxiety  4-Continue crisis stabilization and management  5-Address health issues--monitoring vital signs, stable  6-Treatment plan in progress to prevent relapse of depression and anxiety  Denzil Magnuson, NP C 05/15/2016, 12:06 PM

## 2016-05-15 NOTE — [facility] Group Notes (Signed)
[facility] Group Notes:  (Clinical Social Work)  05/15/2016     1:15-2:15PM  Summary of Progress/Problems:   The main focus of today's process group was to talk about barriers they have experienced to having the life they want. Some of the barriers they mentioned were their depression, anxiety, hearing voices, being constantly degraded by people in their lives, and living in physical pain.  As we discussed possible approaches to dealing with these barriers, there was considerable reluctance.  Motivational Interviewing was then used to highlight the ambivalence and help patients determine their own reasons change may be helpful.  The patient expressed hopelessness that people in her life put her down frequently and in a very hurtful, negative, debilitating fashion, and that she will not get her children back because people think she is a "deadbeat mother."  She was tearful at times, angry, hopeless.  She was able to hear from other people who have children that they use the knowledge those children need them as a means to show the negative people they are wrong.  Type of Therapy:  Group Therapy - Process   Participation Level:  Active  Participation Quality:  Attentive, Resistant and Sharing  Affect:  Depressed and Tearful  Cognitive:  Appropriate  Insight:  Improving  Engagement in Therapy:  Engaged  Modes of Intervention:  Education, Motivational Interviewing  Ambrose MantleMareida Grossman-Orr, LCSW 05/15/2016, 3:44 PM

## 2016-05-15 NOTE — Plan of Care (Signed)
Problem: Education: Goal: Knowledge of the prescribed therapeutic regimen will improve Outcome: Not Progressing Patient refused to get up until almost 11am today not taking her AM meds until that time.

## 2016-05-15 NOTE — Progress Notes (Signed)
D: Pt is flat, isolative and withdrawn to self even while in the dayroom. Pt endorses moderate anxiety with mild depression. Pt however, denies pain, SI, HI or AVH. Pt remained calm and cooperative. A: Medications offered as prescribed.  Support, encouragement, and safe environment provided.  15-minute safety checks continue. R: Pt was med compliant.  Pt attended group. Safety checks continue.

## 2016-05-16 ENCOUNTER — Encounter (HOSPITAL_COMMUNITY): Payer: Self-pay | Admitting: Behavioral Health

## 2016-05-16 MED ORDER — QUETIAPINE FUMARATE 200 MG PO TABS
200.0000 mg | ORAL_TABLET | Freq: Every day | ORAL | Status: DC
  Administered 2016-05-16 – 2016-05-20 (×5): 200 mg via ORAL
  Filled 2016-05-16 (×7): qty 1

## 2016-05-16 MED ORDER — GABAPENTIN 400 MG PO CAPS
400.0000 mg | ORAL_CAPSULE | Freq: Three times a day (TID) | ORAL | Status: DC
  Administered 2016-05-16 – 2016-05-18 (×5): 400 mg via ORAL
  Filled 2016-05-16 (×10): qty 1

## 2016-05-16 MED ORDER — GABAPENTIN 400 MG PO CAPS
ORAL_CAPSULE | ORAL | Status: AC
  Administered 2016-05-16: 17:00:00
  Filled 2016-05-16: qty 1

## 2016-05-16 NOTE — Progress Notes (Signed)
Adult Psychoeducational Group Note  Date:  05/16/2016 Time:  10:59 PM  Group Topic/Focus:  Wrap-Up Group:   The focus of this group is to help patients review their daily goal of treatment and discuss progress on daily workbooks.   Participation Level:  Active  Participation Quality:  Appropriate  Affect:  Appropriate  Cognitive:  Alert, Appropriate and Oriented  Insight: Appropriate  Engagement in Group:  Lacking and Limited  Modes of Intervention:  Discussion  Additional Comments: Patient attended group and she said her day was a 5.  She did not have a goal for today. Umi Mainor W Kemo Spruce 05/16/2016, 10:59 PM

## 2016-05-16 NOTE — Progress Notes (Signed)
Update :  [name] became very anxious and upset at dinner,....requesting prn klonopin, which she was given. She also requested more neurontin ( does was increased to 400 tid and verbal order obtained to transport pt via w/c...when she is experiencing muscle cramps due to her cerebral palsy.

## 2016-05-16 NOTE — Progress Notes (Signed)
D Caitlin Bell is seen lyiing in her bed..with the room dark and her lights turned out. She agrees to take her morning meds, avoiding eye contact and says " I klnow you...you've taken care of me before". A SHe takes her morning meds and then completes  Her daily assessment and on It she writes she denies SI today andshe rates her depression, hopelessness and anxiety "    /  5 5 ", respectively. R Safety is in place

## 2016-05-16 NOTE — Progress Notes (Signed)
Patient ID: Caitlin Bell, female   DOB: 1991/05/19, 25 y.o.   MRN: [id number]  [facility] MD Progress Note  05/16/2016 11:30 AM [name]  MRN:  [id number]  Subjective:  "It takes me about 2 hours to fall asleep even with the Seroquel and when I fall asleep and wake up, it feels as though I have only been asleep for 2 hours.  ."    Objective: Chart reviewed and patient evaluated by this provider for follow-up assessment. Patient is alert, oriented x4, calm, and cooperative during this evaluation.   Caitlin Bell presents to [facility] for worsening depression and hx of Bipolar. At current, [place] denies active or passive suicidal ideations with plan or intent, homicidal ideations, AVH, or urges to engage in self harming behaviors. She does continue to endorse some depression and anxiety yet she report overall improvement in both. She remains complaint with current medications reporting they are well tolerated without adverse events.  She reports insomnia yet reports eating well with no issues. She remains active in group with no behavioral issues or agitated noted or reported. At current, she is able to contract for safety while on the unit.   Principal Problem: Bipolar I disorder, most recent episode depressed, severe w psychosis (HCC) Diagnosis:   Patient Active Problem List   Diagnosis Date Noted  . Bipolar I disorder, most recent episode depressed, severe w psychosis (HCC) [F31.5] 05/13/2016  . Affective psychosis, bipolar (HCC) [F31.9]   . Bipolar affective disorder, depressed, severe (HCC) [F31.4] 03/01/2016  . Bipolar affective disorder, current episode severe (HCC) [F31.9] 03/01/2016  . Bipolar affective disorder, depressed, severe, with psychotic behavior (HCC) [F31.5] 02/02/2016  . Normal labor [O80, Z37.9] 10/31/2014  . Bipolar disorder (HCC) [F31.9] 10/31/2014  . Cholestasis of pregnancy [O26.619, K83.1] 10/31/2014  . H/O anorexia nervosa [Z86.59] 10/31/2014  . H/O bulimia  nervosa [Z86.59] 10/31/2014  . Gestational diabetes [O24.419] 10/31/2014  . Depression--on Latuda [F32.9] 10/31/2014  . Umbilical hernia [K42.9] 10/31/2014  . Hx of preterm delivery  [O09.219] 10/31/2014  . Preterm delivery [O60.10X0] 10/31/2014  . Generalized anxiety disorder [F41.1] 10/31/2014  . History of marijuana use [Z87.898] 10/31/2014  . Cerebral palsy (HCC) [G80.9] 10/31/2014  . [redacted] weeks gestation of pregnancy [Z3A.35]   . Hx of PTL (preterm labor), current pregnancy [O09.219] 10/30/2014  . Preterm labor [O60.10X0] 10/30/2014  . Back pain complicating pregnancy in third trimester [O26.893, M54.9] 10/14/2014   Total Time spent with patient: 15 minutes  Past Psychiatric History: see HPI  Past Medical History:  Past Medical History:  Diagnosis Date  . Anxiety   . Bipolar disorder (HCC)   . 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 Depression   . Gestational diabetes     Past Surgical History:  Procedure Laterality Date  . CHOLECYSTECTOMY  2014  . HERNIA REPAIR     Family History: History reviewed. No pertinent family history. Family Psychiatric  History: see HPI Social History:  History  Alcohol Use  . Yes    Comment: ocasionally, not with the pregnancy     History  Drug Use No    Comment: hx of marijuana use 3 weeks ago    Social History   Social History  . Marital status: Single    Spouse name: N/A  . Number of children: N/A  . Years of education: N/A   Social History Main Topics  . Smoking status: Former Smoker    Quit date: 10/31/2011  .  Smokeless tobacco: Never Used  . Alcohol use Yes     Comment: ocasionally, not with the pregnancy  . Drug use: No     Comment: hx of marijuana use 3 weeks ago  . Sexual activity: Yes    Birth control/ protection: None   Other Topics Concern  . None   Social History Narrative  . None   Additional Social History:     Sleep: Poor  Appetite:  Good  Current Medications: Current  Facility-Administered Medications  Medication Dose Route Frequency Provider Last Rate Last Dose  . acetaminophen (TYLENOL) tablet 650 mg  650 mg Oral Q4H PRN Kristeen Mans, NP   650 mg at 05/13/16 2220  . clonazePAM (KLONOPIN) tablet 0.5 mg  0.5 mg Oral TID PRN Adonis Brook, NP   0.5 mg at 05/16/16 0918  . feeding supplement (BOOST / RESOURCE BREEZE) liquid 1 Container  1 Container Oral Q24H Craige Cotta, MD   1 Container at 05/15/16 2100  . gabapentin (NEURONTIN) capsule 300 mg  300 mg Oral TID Adonis Brook, NP   300 mg at 05/16/16 0917  . ondansetron (ZOFRAN) tablet 4 mg  4 mg Oral Q8H PRN Kristeen Mans, NP   4 mg at 05/13/16 1209  . QUEtiapine (SEROQUEL) tablet 150 mg  150 mg Oral QHS Craige Cotta, MD   150 mg at 05/15/16 2121    Lab Results:  No results found for this or any previous visit (from the past 48 hour(s)).  Blood Alcohol level:  Lab Results  Component Value Date   ETH <5 05/13/2016   ETH <5 02/29/2016    Metabolic Disorder Labs: Lab Results  Component Value Date   HGBA1C 5.2 05/14/2016   MPG 103 05/14/2016   MPG 114 02/04/2016   Lab Results  Component Value Date   PROLACTIN 32.8 (H) 02/04/2016   Lab Results  Component Value Date   CHOL 135 05/14/2016   TRIG 61 05/14/2016   HDL 58 05/14/2016   CHOLHDL 2.3 05/14/2016   VLDL 12 05/14/2016   LDLCALC 65 05/14/2016   LDLCALC 69 02/04/2016    Physical Findings: AIMS: Facial and Oral Movements Muscles of Facial Expression: None, normal Lips and Perioral Area: None, normal Jaw: None, normal Tongue: None, normal,Extremity Movements Upper (arms, wrists, hands, fingers): None, normal Lower (legs, knees, ankles, toes): None, normal, Trunk Movements Neck, shoulders, hips: None, normal, Overall Severity Severity of abnormal movements (highest score from questions above): None, normal Incapacitation due to abnormal movements: None, normal Patient's awareness of abnormal movements (rate only patient's  report): No Awareness, Dental Status Current problems with teeth and/or dentures?: No Does patient usually wear dentures?: No  CIWA:    COWS:     Musculoskeletal: Strength & Muscle Tone: within normal limits Gait & Station: normal Patient leans: N/A  Psychiatric Specialty Exam: Physical Exam  Nursing note and vitals reviewed. Constitutional: She appears well-developed.  HENT:  Head: Normocephalic.  Eyes: Pupils are equal, round, and reactive to light.  Neck: Normal range of motion.  Cardiovascular: Normal rate.   Respiratory: Effort normal.  GI: Soft.  Genitourinary: Vagina normal.  Musculoskeletal: Normal range of motion.  Neurological: She is alert.  Skin: Skin is warm.  Psychiatric: She has a normal mood and affect. Her speech is normal and behavior is normal. Judgment and thought content normal. Cognition and memory are normal.    Review of Systems  Constitutional: Negative.   HENT: Negative.   Eyes: Negative.  Respiratory: Negative.   Cardiovascular: Negative.   Gastrointestinal: Negative.   Genitourinary: Negative.   Musculoskeletal: Negative.   Skin: Negative.   Neurological: Negative.   Endo/Heme/Allergies: Negative.   Psychiatric/Behavioral: Positive for depression. Negative for hallucinations, memory loss, substance abuse and suicidal ideas. The patient is nervous/anxious and has insomnia.   All other systems reviewed and are negative.   Blood pressure (!) 89/63, pulse (!) 113, temperature 98.6 F (37 C), temperature source Oral, resp. rate 18, height 5\' 3"  (1.6 m), weight 56.7 kg (125 lb), last menstrual period 04/29/2016, SpO2 100 %, not currently breastfeeding.Body mass index is 22.14 kg/m.  General Appearance: Fairly Groomed  Eye Contact:  Good  Speech:  Clear and Coherent  Volume:  Normal  Mood:  Anxious and Depressed  Affect:  Congruent  Thought Process:  Coherent  Orientation:  Full (Time, Place, and Person)  Thought Content:  Rumination   Suicidal Thoughts:  No  Homicidal Thoughts:  No  Memory:  Immediate;   Good Recent;   Good Remote;   Good  Judgement:  Good  Insight:  Good  Psychomotor Activity:  Normal  Concentration:  Concentration: Good and Attention Span: Good  Recall:  Good  Fund of Knowledge:  Good  Language:  Good  Akathisia:  Negative  Handed:  Right  AIMS (if indicated):     Assets:  Physical Health  ADL's:  Intact  Cognition:  WNL  Sleep:  Number of Hours: 5.75   Treatment Plan Summary: Review of chart, vital signs, medications, and notes.  1-Individual and group therapy  2-Medication management for depression and anxiety: Medications reviewed with the patient.  Klonopin 0.5 mg Q8H PRN anxiety, Gabapentin 200 mg TID anxiety,  Increase Seroquel from 150 mg HS to 200 mg po QHS for better management of  mood control and insomnia.  3-Coping skills for depression, anxiety  4-Continue crisis stabilization and management  5-Address health issues--monitoring vital signs, stable  6-Treatment plan in progress to prevent relapse of depression and anxiety  Denzil MagnusonLaShunda Beau Ramsburg, NP C 05/16/2016, 11:30 AM

## 2016-05-16 NOTE — [facility] Group Notes (Signed)
[facility] Group Notes:  (Clinical Social Work)   05/16/2016   1:15-2:15PM  Summary of Progress/Problems:   The main focus of today's process group was to   1)  define health supports versus unhealthy supports  2)  Plan how to decrease unhealthy supports   3)  Plan how to increase healthy supports   The group was very depressed as a whole and patients did not present as willing to change, were in general resistant to suggestion that improvements could be made.  There was little discussion, and Motivational interviewing was not effective.  Thus CSW took the tactic of having the group listen to 4 songs with inspirational messages and talk about feelings evoked, which generated more discussion and even some visible reactions, from crying to interpretive dance to bobbing head in rhythm.  The patient expressed full comprehension of the concepts presented and during group she talked freely, asked questions and made suggestions to people.  She also cried heavily during one song, and did an interpretive dance using sign language to a song that was played that she said is her theme song, "Fight Song."  Others seemed to enjoy her doing this.  Type of Therapy:  Process Group with Motivational Interviewing  Participation Level:  Active  Participation Quality:  Attentive and Sharing  Affect:  Depressed, Flat and Tearful  Cognitive:  Appropriate  Insight:  Developing/Improving  Engagement in Therapy:  Engaged  Modes of Intervention:   Education, Support and Processing, Activity  Ambrose MantleMareida Grossman-Orr, LCSW 05/16/2016    3:29 PM

## 2016-05-16 NOTE — Progress Notes (Signed)
Adult Psychoeducational Group Note  Date:  05/15/2016 Time:  10:58  Group Topic/Focus:  Wrap-Up Group:   The focus of this group is to help patients review their daily goal of treatment and discuss progress on daily workbooks.   Participation Level:  Active  Participation Quality:  Appropriate  Affect:  Appropriate  Cognitive:  Alert, Appropriate and Oriented  Insight: Appropriate  Engagement in Group:  Engaged  Modes of Intervention:  Discussion  Additional Comments:  Patient attended group and she said her day was a 5.  Her coping skill is coloring.  Hamna Asa W Sandrika Schwinn 05/15/2016, 10:58pm

## 2016-05-16 NOTE — Progress Notes (Signed)
D: Pt denies SI/HI/AV. Pt is pleasant and cooperative. Pt goal for today is to EAT.  A: Pt was offered support and encouragement. Pt was given scheduled medications. Pt was encourage to attend groups. Q 15 minute checks were done for safety.  R:Pt attends groups and interacts well with peers and staff. Pt is taking medication. Pt has no complaints.Pt receptive to treatment and safety maintained on unit.

## 2016-05-17 MED ORDER — SERTRALINE HCL 50 MG PO TABS
50.0000 mg | ORAL_TABLET | Freq: Every day | ORAL | Status: DC
  Administered 2016-05-18 – 2016-05-21 (×4): 50 mg via ORAL
  Filled 2016-05-17 (×6): qty 1

## 2016-05-17 NOTE — [facility] Group Notes (Signed)
[facility]  05/17/2016 8:45 AM  Pt did not attend, declined invitation.   Chad CordialLauren Carter, LCSWA 05/17/2016 9:42 AM
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
## 2016-05-17 NOTE — Progress Notes (Addendum)
D [name] remains quiet, isolative and convinced she needs more neurontin " to make me feel better". A SHe completed her daily assessment this morning nad on it she wrote she denied SI today and she rated her depression, hopelessness and anxiety " 5/5/7". R She is working on her DC plan and is encouraged to engage in her therapies. POC includes increasing her zoloft to 50 mg po qd.

## 2016-05-17 NOTE — Plan of Care (Signed)
Problem: Medication: Goal: Compliance with prescribed medication regimen will improve Outcome: Progressing Pt compliant with medication regime   

## 2016-05-17 NOTE — Progress Notes (Signed)
Patient ID: Caitlin Bell, female   DOB: 1991/08/17, 25 y.o.   MRN: [id number]  [facility] MD Progress Note  05/17/2016 4:45 PM Caitlin Bell  MRN:  [id number]  Subjective:   Patient reports ongoing depression, intermittent anxiety, but overall reports feeling better . She continues to focus, ruminate on her children- as per DSS , they are now in her mother's custody, patient sees them once a week .Reports that her sister will likely visit later today, and is looking forward to this . Denies medication side effects at this time .   Objective: I have met with patient and have discussed case with treatment team. Patient reports some improvement but reports an ongoing sense of sadness and depression, which she attributes partially to limited interactions with and not having custody of her children. Denies medication side effects and feels Seroquel is effective for her. She does , as above, report depression, and is interested in an antidepressant medication trial.  Of note, patient mobilizing in wheel chair today- I inquired about this, as she normally ambulates, has steady gait- states  " my cerebral palsy sometimes acts up, and I get ankle spasms" At this time not in any acute distress or discomfort, no akathisia or dystonia noted or reported . Visible in unit, interacting appropriately with selected peers .  Principal Problem: Bipolar I disorder, most recent episode depressed, severe w psychosis ([place]) Diagnosis:   Patient Active Problem List   Diagnosis Date Noted  . Bipolar I disorder, most recent episode depressed, severe w psychosis ([place]) [F31.5] 05/13/2016  . Affective psychosis, bipolar ([place]) [F31.9]   . Bipolar affective disorder, depressed, severe ([place]) [F31.4] 03/01/2016  . Bipolar affective disorder, current episode severe ([place]) [F31.9] 03/01/2016  . Bipolar affective disorder, depressed, severe, with psychotic behavior ([place]) [F31.5] 02/02/2016  . Normal labor [O80, Z37.9]  10/31/2014  . Bipolar disorder ([place]) [F31.9] 10/31/2014  . Cholestasis of pregnancy [O26.619, K83.1] 10/31/2014  . H/O anorexia nervosa [Z86.59] 10/31/2014  . H/O bulimia nervosa [Z86.59] 10/31/2014  . Gestational diabetes [O24.419] 10/31/2014  . Depression--on Latuda [F32.9] 10/31/2014  . Umbilical hernia [M42.6] 10/31/2014  . Hx of preterm delivery  [O09.219] 10/31/2014  . Preterm delivery [O60.10X0] 10/31/2014  . Generalized anxiety disorder [F41.1] 10/31/2014  . History of marijuana use [Z87.898] 10/31/2014  . Cerebral palsy ([place]) [G80.9] 10/31/2014  . [redacted] weeks gestation of pregnancy [Z3A.35]   . Hx of PTL (preterm labor), current pregnancy [O09.219] 10/30/2014  . Preterm labor [O60.10X0] 10/30/2014  . Back pain complicating pregnancy in third trimester [O26.893, M54.9] 10/14/2014   Total Time spent with patient: 20 minutes   Past Psychiatric History: see HPI  Past Medical History:  Past Medical History:  Diagnosis Date  . Anxiety   . Bipolar disorder ([place])   . 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 Depression   . Gestational diabetes     Past Surgical History:  Procedure Laterality Date  . CHOLECYSTECTOMY  2014  . HERNIA REPAIR     Family History: History reviewed. No pertinent family history. Family Psychiatric  History: see HPI Social History:  History  Alcohol Use  . Yes    Comment: ocasionally, not with the pregnancy     History  Drug Use No    Comment: hx of marijuana use 3 weeks ago    Social History   Social History  . Marital status: Single    Spouse name: N/A  . Number of children: N/A  .  Years of education: N/A   Social History Main Topics  . Smoking status: Former Smoker    Quit date: 10/31/2011  . Smokeless tobacco: Never Used  . Alcohol use Yes     Comment: ocasionally, not with the pregnancy  . Drug use: No     Comment: hx of marijuana use 3 weeks ago  . Sexual activity: Yes    Birth control/ protection: None   Other  Topics Concern  . None   Social History Narrative  . None   Additional Social History:     Sleep: Poor  Appetite:  Good  Current Medications: Current Facility-Administered Medications  Medication Dose Route Frequency Provider Last Rate Last Dose  . acetaminophen (TYLENOL) tablet 650 mg  650 mg Oral Q4H PRN Lurena Nida, NP   650 mg at 05/13/16 2220  . clonazePAM (KLONOPIN) tablet 0.5 mg  0.5 mg Oral TID PRN Kerrie Buffalo, NP   0.5 mg at 05/16/16 1658  . feeding supplement (BOOST / RESOURCE BREEZE) liquid 1 Container  1 Container Oral Q24H Jenne Campus, MD   1 Container at 05/16/16 2000  . gabapentin (NEURONTIN) capsule 400 mg  400 mg Oral TID Derrill Center, NP   400 mg at 05/17/16 1152  . ondansetron (ZOFRAN) tablet 4 mg  4 mg Oral Q8H PRN Lurena Nida, NP   4 mg at 05/13/16 1209  . QUEtiapine (SEROQUEL) tablet 200 mg  200 mg Oral QHS Mordecai Maes, NP   200 mg at 05/16/16 2204    Lab Results:  No results found for this or any previous visit (from the past 48 hour(s)).  Blood Alcohol level:  Lab Results  Component Value Date   ETH <5 05/13/2016   ETH <5 79/89/2119    Metabolic Disorder Labs: Lab Results  Component Value Date   HGBA1C 5.2 05/14/2016   MPG 103 05/14/2016   MPG 114 02/04/2016   Lab Results  Component Value Date   PROLACTIN 32.8 (H) 02/04/2016   Lab Results  Component Value Date   CHOL 135 05/14/2016   TRIG 61 05/14/2016   HDL 58 05/14/2016   CHOLHDL 2.3 05/14/2016   VLDL 12 05/14/2016   LDLCALC 65 05/14/2016   LDLCALC 69 02/04/2016    Physical Findings: AIMS: Facial and Oral Movements Muscles of Facial Expression: None, normal Lips and Perioral Area: None, normal Jaw: None, normal Tongue: None, normal,Extremity Movements Upper (arms, wrists, hands, fingers): None, normal Lower (legs, knees, ankles, toes): None, normal, Trunk Movements Neck, shoulders, hips: None, normal, Overall Severity Severity of abnormal movements (highest  score from questions above): None, normal Incapacitation due to abnormal movements: None, normal Patient's awareness of abnormal movements (rate only patient's report): No Awareness, Dental Status Current problems with teeth and/or dentures?: No Does patient usually wear dentures?: No  CIWA:    COWS:     Musculoskeletal: Strength & Muscle Tone: within normal limits Gait & Station: normal Patient leans: N/A  Psychiatric Specialty Exam: Physical Exam  Nursing note and vitals reviewed. Constitutional: She appears well-developed.  HENT:  Head: Normocephalic.  Eyes: Pupils are equal, round, and reactive to light.  Neck: Normal range of motion.  Cardiovascular: Normal rate.   Respiratory: Effort normal.  GI: Soft.  Genitourinary: Vagina normal.  Musculoskeletal: Normal range of motion.  Neurological: She is alert.  Skin: Skin is warm.  Psychiatric: She has a normal mood and affect. Her speech is normal and behavior is normal. Judgment and thought content  normal. Cognition and memory are normal.    Review of Systems  Constitutional: Negative.   HENT: Negative.   Eyes: Negative.   Respiratory: Negative.   Cardiovascular: Negative.   Gastrointestinal: Negative.   Genitourinary: Negative.   Musculoskeletal: Negative.   Skin: Negative.   Neurological: Negative.   Endo/Heme/Allergies: Negative.   Psychiatric/Behavioral: Positive for depression. Negative for hallucinations, memory loss, substance abuse and suicidal ideas. The patient is nervous/anxious and has insomnia.   All other systems reviewed and are negative.   Blood pressure (!) 98/53, pulse (!) 101, temperature 97.2 F (36.2 C), temperature source Oral, resp. rate 18, height 5' 3"  (1.6 m), weight 125 lb (56.7 kg), last menstrual period 04/29/2016, SpO2 100 %, not currently breastfeeding.Body mass index is 22.14 kg/m.  General Appearance: improved grooming   Eye Contact:  Good  Speech:  Clear and Coherent  Volume:   Normal  Mood: remains depressed, affect vaguely constricted, anxious,but reactive   Affect:  As above   Thought Process:  Coherent  Orientation:  Full (Time, Place, and Person)  Thought Content:  Rumination, mainly about her children, custody issues   Suicidal Thoughts:  No denies suicidal plan or intention and contracts for safety on the unit   Homicidal Thoughts:  No denies any homicidal ideations   Memory:  Immediate;   Good Recent;   Good Remote;   Good  Judgement:  Good  Insight:  Good  Psychomotor Activity:  Normal  Concentration:  Concentration: Good and Attention Span: Good  Recall:  Good  Fund of Knowledge:  Good  Language:  Good  Akathisia:  Negative  Handed:  Right  AIMS (if indicated):     Assets:  Physical Health  ADL's:  Intact  Cognition:  WNL  Sleep:  Number of Hours: 5.25    Assessment - patient reports some ongoing depression, anxiety, although does feel that medications are helping partially and denies side effects at this time . Continues to ruminate about not having custody of her children, as her major stressor. Visible on unit, interacting appropriately with peers .Marland Kitchen We discussed medication options, agrees to adding Zoloft to current regimen in an effort to address mood, depression, anxiety We reviewed side effects and rationale .  Treatment Plan Summary: Encourage ongoing group and milieu participation to work on coping skills and symptom reduction . Start Zoloft 50 mgrs QDAY for depression and anxiety- side effects reviewed  Continue   Klonopin 0.5 mg Q8H PRN anxiety as needed Continue Gabapentin 200 mg TID for  Anxiety Continue Seroquel 200 mg po QHS for mood Disorder  Patient expressing interest in family meeting with her mother, but not sure if she would come- CSW to contact mother to try to arrange.    Neita Garnet, MD  05/17/2016, 4:45 PM

## 2016-05-17 NOTE — Progress Notes (Signed)
Patient ID: Caitlin Bell, female   DOB: 04/02/1991, 25 y.o.   MRN: [id number] D: Patient in dayroom interacting with peers report doing well. Pt reported no pain from increase medication dosage. Pt reports she happy to be back on her home medication. Denies  SI/HI/AVH and pain.No behavioral issues noted.  A: Support and encouragement offered as needed. Medications administered as prescribed.  R: Patient cooperative and appropriate on unit. Will continue to monitor patient for safety and stability.

## 2016-05-17 NOTE — Progress Notes (Signed)
Recreation Therapy Notes  Date: 05/17/16 Time: 0930 Location: 300 Group Room  Group Topic: Stress Management  Goal Area(s) Addresses:  Patient will verbalize importance of using healthy stress management.  Patient will identify positive emotions associated with healthy stress management.   Intervention: Stress Management  Activity :  [place] Script.  LRT will introduce the technique of guided imagery to patients.  Patients will follow along with LRT as script is read to engage in activity.  Education:  Stress Management, Discharge Planning.   Education Outcome: Needs additional education  Clinical Observations/Feedback: Pt did not attend group.    Zaelyn Barbary, LRT/CTRS  

## 2016-05-18 DIAGNOSIS — Z79899 Other long term (current) drug therapy: Secondary | ICD-10-CM

## 2016-05-18 DIAGNOSIS — Z87891 Personal history of nicotine dependence: Secondary | ICD-10-CM

## 2016-05-18 MED ORDER — DIVALPROEX SODIUM ER 500 MG PO TB24
500.0000 mg | ORAL_TABLET | Freq: Every day | ORAL | Status: DC
  Administered 2016-05-18 – 2016-05-20 (×3): 500 mg via ORAL
  Filled 2016-05-18 (×4): qty 1

## 2016-05-18 MED ORDER — GABAPENTIN 300 MG PO CAPS
600.0000 mg | ORAL_CAPSULE | Freq: Three times a day (TID) | ORAL | Status: DC
  Administered 2016-05-18 – 2016-05-21 (×9): 600 mg via ORAL
  Filled 2016-05-18 (×11): qty 2

## 2016-05-18 NOTE — Progress Notes (Signed)
[facility] Group Notes:  (Nursing/MHT/Case Management/Adjunct)  Date:  05/18/2016  Time:  12:23 AM  Type of Therapy:  Psychoeducational Skills  Participation Level:  Active  Participation Quality:  Appropriate  Affect:  Anxious  Cognitive:  Lacking  Insight:  Lacking  Engagement in Group:  Developing/Improving  Modes of Intervention:  Education  Summary of Progress/Problems: The patient shared in group that she had a good day because she was able to socialize with one of her peers. The patient's wellness strategy (theme of the day) will be to eat more.   Jamicah Anstead S 05/18/2016, 12:23 AM

## 2016-05-18 NOTE — Progress Notes (Signed)
D: Pt was in the day room upon initial approach.  Pt has anxious affect and mood.  She reports she had a "good" day and that her goal was to "try to eat."  Pt reports she accomplished her goal and ate meals.  Pt denies SI/HI, denies hallucinations, denies pain.  Pt has been visible in milieu. Pt attended evening group.   A: Introduced self to pt.  Met with pt and offered support and encouragement.  Actively listened to pt.  Medications administered per order.  PRN medication administered for anxiety.  Redirected pt for intrusive behaviors with peer on 300 hall. R: Pt is compliant with medications.  Pt verbally contracts for safety.  Will continue to monitor and assess.

## 2016-05-18 NOTE — Tx Team (Signed)
Interdisciplinary Treatment Plan Update (Adult) Date: 05/13/2016   Date: 05/13/2016 3:34 PM  Progress in Treatment:  Attending groups: Yes   Participating in groups: Yes Taking medication as prescribed: Yes  Tolerating medication: Yes  Family/Significant othe contact made: No, Pt declines Patient understands diagnosis: Yes AEB seeking help for depression Discussing patient identified problems/goals with staff: Yes  Medical problems stabilized or resolved: Yes  Denies suicidal/homicidal ideation: Pt endorses SI Patient has not harmed self or Others: Yes   New problem(s) identified: None identified at this time.   Discharge Plan or Barriers: Pt will return home and follow-up with outpatient services.   Additional comments:  Patient and CSW reviewed pt's identified goals and treatment plan. Patient verbalized understanding and agreed to treatment plan. CSW reviewed [facility] "Discharge Process and Patient Involvement" Form. Pt verbalized understanding of information provided and signed form.   Reason for Continuation of Hospitalization:  Anxiety Depression Medication stabilization Suicidal ideation  Estimated length of stay: 2-3 days  Review of initial/current patient goals per problem list:   1.  Goal(s): Patient will participate in aftercare plan  Met:  Yes  Target date: 3-5 days from date of admission   As evidenced by: Patient will participate within aftercare plan AEB aftercare provider and housing plan at discharge being identified.  05/13/16: CSW to work with Pt to assess for appropriate discharge plan and faciliate appointments and referrals as needed prior to d/c. 05/18/16: Pt will return home and follow-up with outpatient services.   2.  Goal (s): Patient will exhibit decreased depressive symptoms and suicidal ideations.  Met:  Progressing  Target date: 3-5 days from date of admission   As evidenced by: Patient will utilize self rating of depression at 3 or below and  demonstrate decreased signs of depression or be deemed stable for discharge by MD. 05/13/16: Pt was admitted with symptoms of depression, rating 10/10. Pt continues to present with flat affect and depressive symptoms.  Pt will demonstrate decreased symptoms of depression and rate depression at 3/10 or lower prior to discharge. 05/18/16: Pt rates depression at 5/10; denies SI  3.  Goal(s): Patient will demonstrate decreased signs and symptoms of anxiety.  Met:  No  Target date: 3-5 days from date of admission   As evidenced by: Patient will utilize self rating of anxiety at 3 or below and demonstrated decreased signs of anxiety, or be deemed stable for discharge by MD 05/13/16: Pt was admitted with increased levels of anxiety and is currently rating those symptoms highly. Pt will demonstrated decreased symptoms of anxiety and rate it at 3/10 prior to d/c. 05/18/16: Pt rates anxiety at 7/10.    Attendees:  Patient:    Family:    Physician: Dr. Parke Poisson, MD  05/13/2016 3:34 PM  Nursing: Loletta Specter, RN; Mayra Neer, RN 05/13/2016 3:34 PM  Clinical Social Worker Peri Maris, [place] 05/13/2016 3:34 PM  Other: Tilden Fossa, [place] 05/13/2016 3:34 PM  Clinical:   05/13/2016 3:34 PM  Other:  05/13/2016 3:34 PM  Other:     Peri Maris, [place] Social Work [phone]

## 2016-05-18 NOTE — Plan of Care (Signed)
Problem: Safety: Goal: Periods of time without injury will increase Outcome: Progressing Patient has not engaged in self injury, denies SI.  Problem: Self-Concept: Goal: Level of anxiety will decrease Outcome: Not Progressing Patient continues to complain of persistent anxiety.

## 2016-05-18 NOTE — [facility] Group Notes (Signed)
[facility] LCSW Group Therapy 05/18/2016 1:15 PM  Type of Therapy: Group Therapy- Feelings about Diagnosis  Participation Level: Minimal  Participation Quality:  Appropriate  Affect:  Withdrawn  Cognitive: Alert and Oriented   Insight:  Developing   Engagement in Therapy: Developing/Improving   Modes of Intervention: Clarification, Confrontation, Discussion, Education, Exploration, Limit-setting, Orientation, Problem-solving, Rapport Building, Dance movement psychotherapisteality Testing, Socialization and Support  Description of Group:   This group will allow patients to explore their thoughts and feelings about diagnoses they have received. Patients will be guided to explore their level of understanding and acceptance of these diagnoses. Facilitator will encourage patients to process their thoughts and feelings about the reactions of others to their diagnosis, and will guide patients in identifying ways to discuss their diagnosis with significant others in their lives. This group will be process-oriented, with patients participating in exploration of their own experiences as well as giving and receiving support and challenge from other group members.  Summary of Progress/Problems:  Pt reports that she had a negative conversation with a boy she is romantically interested in; the conversation led to him saying rude things to her because "he doesn't understand what I am going through." Pt was more withdrawn in group today, appearing upset.   Therapeutic Modalities:   Cognitive Behavioral Therapy Solution Focused Therapy Motivational Interviewing Relapse Prevention Therapy  Chad CordialLauren Carter, LCSWA 05/18/2016 4:41 PM

## 2016-05-18 NOTE — Progress Notes (Signed)
Recreation Therapy Notes   Animal-Assisted Activity (AAA) Program Checklist/Progress Notes Patient Eligibility Criteria Checklist & Daily Group note for Rec TxIntervention  Date: 08.15.2017 Time: 2:45pm [address]   AAA/T Program Assumption of Risk Form signed by Patient/ or Parent Legal Guardian Yes  Patient is free of allergies or sever asthma Yes  Patient reports no fear of animals Yes  Patient reports no history of cruelty to animals Yes  Patient understands his/her participation is voluntary Yes  Patient washes hands before animal contact Yes  Patient washes hands after animal contact Yes  Behavioral Response: Engaged, Attentive, Appropriate   Education:Hand Washing, Appropriate Animal Interaction   Education Outcome: Acknowledges education.   Clinical Observations/Feedback: Patient attended session and interacted appropriately with therapy dog and peers. Patient asked appropriate questions about therapy dog and his training.    Marykay Lexenise L Shah Insley, LRT/CTRS  Tausha Milhoan L 05/18/2016 3:13 PM

## 2016-05-18 NOTE — Progress Notes (Signed)
Adult Psychoeducational Group Note  Date:  05/18/2016 Time:  8:20 PM  Group Topic/Focus:  Wrap-Up Group:   The focus of this group is to help patients review their daily goal of treatment and discuss progress on daily workbooks.   Participation Level:  Active  Participation Quality:  Appropriate  Affect:  Appropriate  Cognitive:  Appropriate  Insight: Appropriate  Engagement in Group:  Engaged  Modes of Intervention:  Discussion  Additional Comments:  Pt rated her overall day a 4 out of 10 because of her anxiety. Pt reported that she achieved her goal for the day, which was to try to eat more.    Cleotilde NeerJasmine S Reynaldo Rossman 05/18/2016, 9:30 PM

## 2016-05-18 NOTE — [facility] Group Notes (Signed)
[facility] Group Notes:  (Nursing/MHT/Case Management/Adjunct)  Date:  05/18/2016  Time:  0900  Type of Therapy:  Nurse Education  Participation Level:  Did Not Attend  Participation Quality:    Affect:    Cognitive:    Insight:    Engagement in Group:    Modes of Intervention:    Summary of Progress/Problems: Patient was invited to participate however elected to remain in bed.  Caitlin Bell 05/18/2016, 0930 

## 2016-05-18 NOTE — Progress Notes (Signed)
D: Patient somewhat isolative, resting in bed this AM. Up and visible in the milieu after lunch. Spoke with patient 1:1. Rates sleep as good, appetite fair, energy as low and concentration as poor. Patient's affect anxious, mood congruent. Rating her depression at an  /10, hopelessness at a 5/10 and anxiety at a 10/10. States goal for today is to "try to get self worth back and think of positive things." Complained of nausea this AM.   A: Medicated per orders, zofran prn given. Klonpin prn given after lunch for anxiety. Emotional support offered and self inventory reviewed.  R: On reassess, patient's nausea resolved. Reported some relief from klonopin initially however complained of anxiety/requested more klonopin within 4 hours. She denies SI/HI and remains safe on level III obs.

## 2016-05-18 NOTE — Progress Notes (Signed)
Patient ID: Caitlin Bell, female   DOB: 05-12-1991, 25 y.o.   MRN: [id number]  [facility] MD Progress Note  05/18/2016 1:30 PM AILA TERRA  MRN:  [id number]  Subjective:   Patient reports feeling worse today than yesterday and does not understand why--feeling really depressed with increased anxiety, agitation but sleeping better since the increase in seroquel. Thinks that gabapentin is not working well for her Cerebral palsy but had done well on 666m 3 times daily She continues to focus, ruminate on her children- as per DSS , they are now in her mother's custody, patient sees them once a week. Has a Social Security Physical 05/24/16 and will like for social worker to call them to cancel or to let them know she is here Denies medication side effects at this time .   Objective: I have met with patient and have discussed case with treatment team. Patient reports some improvement but reports an ongoing sense of sadness and depression, which she  Says " I can't get myself out of"   Alert, fully oriented and cooperative. Speech is coherent and goal directed, Mood is labile,, affect is depressed. She admits to seeing people walking around and calling her name but walking away. Denies suicidal or homicidal ideation and expresses no delusional ideas, Insight and judgemen are improving  Principal Problem: Bipolar I disorder, most recent episode depressed, severe w psychosis ([place] Diagnosis:   Patient Active Problem List   Diagnosis Date Noted  . Bipolar I disorder, most recent episode depressed, severe w psychosis ([place] [F31.5] 05/13/2016  . Affective psychosis, bipolar ([place] [F31.9]   . Bipolar affective disorder, depressed, severe ([place] [F31.4] 03/01/2016  . Bipolar affective disorder, current episode severe ([place] [F31.9] 03/01/2016  . Bipolar affective disorder, depressed, severe, with psychotic behavior ([place] [F31.5] 02/02/2016  . Normal labor [O80, Z37.9] 10/31/2014  . Bipolar disorder ([place]  [F31.9] 10/31/2014  . Cholestasis of pregnancy [O26.619, K83.1] 10/31/2014  . H/O anorexia nervosa [Z86.59] 10/31/2014  . H/O bulimia nervosa [Z86.59] 10/31/2014  . Gestational diabetes [O24.419] 10/31/2014  . Depression--on Latuda [F32.9] 10/31/2014  . Umbilical hernia [[G01.7]10/31/2014  . Hx of preterm delivery  [O09.219] 10/31/2014  . Preterm delivery [O60.10X0] 10/31/2014  . Generalized anxiety disorder [F41.1] 10/31/2014  . History of marijuana use [Z87.898] 10/31/2014  . Cerebral palsy ([place] [G80.9] 10/31/2014  . [redacted] weeks gestation of pregnancy [Z3A.35]   . Hx of PTL (preterm labor), current pregnancy [O09.219] 10/30/2014  . Preterm labor [O60.10X0] 10/30/2014  . Back pain complicating pregnancy in third trimester [O26.893, M54.9] 10/14/2014   Total Time spent with patient: 25 minutes   Past Psychiatric History: see HPI  Past Medical History:  Past Medical History:  Diagnosis Date  . Anxiety   . Bipolar disorder ([place]   . 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 Depression   . Gestational diabetes     Past Surgical History:  Procedure Laterality Date  . CHOLECYSTECTOMY  2014  . HERNIA REPAIR     Family History: History reviewed. No pertinent family history. Family Psychiatric  History: see HPI Social History:  History  Alcohol Use  . Yes    Comment: ocasionally, not with the pregnancy     History  Drug Use No    Comment: hx of marijuana use 3 weeks ago    Social History   Social History  . Marital status: Single    Spouse name: N/A  . Number of children: N/A  .  Years of education: N/A   Social History Main Topics  . Smoking status: Former Smoker    Quit date: 10/31/2011  . Smokeless tobacco: Never Used  . Alcohol use Yes     Comment: ocasionally, not with the pregnancy  . Drug use: No     Comment: hx of marijuana use 3 weeks ago  . Sexual activity: Yes    Birth control/ protection: None   Other Topics Concern  . None   Social  History Narrative  . None   Additional Social History:     Sleep: Poor  Appetite:  Good  Current Medications: Current Facility-Administered Medications  Medication Dose Route Frequency Provider Last Rate Last Dose  . acetaminophen (TYLENOL) tablet 650 mg  650 mg Oral Q4H PRN Lurena Nida, NP   650 mg at 05/13/16 2220  . clonazePAM (KLONOPIN) tablet 0.5 mg  0.5 mg Oral TID PRN Kerrie Buffalo, NP   0.5 mg at 05/18/16 1256  . feeding supplement (BOOST / RESOURCE BREEZE) liquid 1 Container  1 Container Oral Q24H Myer Peer Cobos, MD   237 mL at 05/17/16 1937  . gabapentin (NEURONTIN) capsule 400 mg  400 mg Oral TID Derrill Center, NP   400 mg at 05/18/16 1150  . ondansetron (ZOFRAN) tablet 4 mg  4 mg Oral Q8H PRN Lurena Nida, NP   4 mg at 05/18/16 0827  . QUEtiapine (SEROQUEL) tablet 200 mg  200 mg Oral QHS Mordecai Maes, NP   200 mg at 05/17/16 2124  . sertraline (ZOLOFT) tablet 50 mg  50 mg Oral Daily Jenne Campus, MD   50 mg at 05/18/16 1151    Lab Results:  No results found for this or any previous visit (from the past 48 hour(s)).  Blood Alcohol level:  Lab Results  Component Value Date   ETH <5 05/13/2016   ETH <5 09/62/8366    Metabolic Disorder Labs: Lab Results  Component Value Date   HGBA1C 5.2 05/14/2016   MPG 103 05/14/2016   MPG 114 02/04/2016   Lab Results  Component Value Date   PROLACTIN 32.8 (H) 02/04/2016   Lab Results  Component Value Date   CHOL 135 05/14/2016   TRIG 61 05/14/2016   HDL 58 05/14/2016   CHOLHDL 2.3 05/14/2016   VLDL 12 05/14/2016   LDLCALC 65 05/14/2016   LDLCALC 69 02/04/2016    Physical Findings: AIMS: Facial and Oral Movements Muscles of Facial Expression: None, normal Lips and Perioral Area: None, normal Jaw: None, normal Tongue: None, normal,Extremity Movements Upper (arms, wrists, hands, fingers): None, normal Lower (legs, knees, ankles, toes): None, normal, Trunk Movements Neck, shoulders, hips: None,  normal, Overall Severity Severity of abnormal movements (highest score from questions above): None, normal Incapacitation due to abnormal movements: None, normal Patient's awareness of abnormal movements (rate only patient's report): No Awareness, Dental Status Current problems with teeth and/or dentures?: No Does patient usually wear dentures?: No  CIWA:    COWS:     Musculoskeletal: Strength & Muscle Tone: within normal limits Gait & Station: normal Patient leans: N/A  Psychiatric Specialty Exam: Physical Exam  Nursing note and vitals reviewed. Constitutional: She appears well-developed.  HENT:  Head: Normocephalic.  Eyes: Pupils are equal, round, and reactive to light.  Neck: Normal range of motion.  Cardiovascular: Normal rate.   Respiratory: Effort normal.  GI: Soft.  Genitourinary: Vagina normal.  Musculoskeletal: Normal range of motion.  Neurological: She is alert.  Skin: Skin  is warm.  Psychiatric: She has a normal mood and affect. Her speech is normal and behavior is normal. Judgment and thought content normal. Cognition and memory are normal.    Review of Systems  Constitutional: Negative.   HENT: Negative.   Eyes: Negative.   Respiratory: Negative.   Cardiovascular: Negative.   Gastrointestinal: Negative.   Genitourinary: Negative.   Musculoskeletal: Negative.   Skin: Negative.   Neurological: Negative.   Endo/Heme/Allergies: Negative.   Psychiatric/Behavioral: Positive for depression. Negative for hallucinations, memory loss, substance abuse and suicidal ideas. The patient is nervous/anxious and has insomnia.   All other systems reviewed and are negative.   Blood pressure 112/60, pulse (!) 103, temperature 98 F (36.7 C), temperature source Oral, resp. rate 16, height 5' 3"  (1.6 m), weight 56.7 kg (125 lb), last menstrual period 04/29/2016, SpO2 100 %, not currently breastfeeding.Body mass index is 22.14 kg/m.  General Appearance: improved grooming   Eye  Contact:  Good  Speech:  Clear and Coherent  Volume:  Normal  Mood: remains depressed, affect vaguely constricted, anxious,but reactive   Affect:  As above   Thought Process:  Coherent  Orientation:  Full (Time, Place, and Person)  Thought Content:  Rumination, mainly about her children, custody issues   Suicidal Thoughts:  No denies suicidal plan or intention and contracts for safety on the unit   Homicidal Thoughts:  No denies any homicidal ideations   Memory:  Immediate;   Good Recent;   Good Remote;   Good  Judgement:  Good  Insight:  Good  Psychomotor Activity:  Normal  Concentration:  Concentration: Good and Attention Span: Good  Recall:  Good  Fund of Knowledge:  Good  Language:  Good  Akathisia:  Negative  Handed:  Right  AIMS (if indicated):     Assets:  Physical Health  ADL's:  Intact  Cognition:  WNL  Sleep:  Number of Hours: 6    Assessment - patient reports some ongoing depression, anxiety, although does feel that medications are helping partially and denies side effects at this time . Continues to ruminate about not having custody of her children, as her major stressor. Visible on unit, interacting appropriately with peers .Marland Kitchen We discussed medication options, agrees to adding Zoloft to current regimen in an effort to address mood, depression, anxiety We reviewed side effects and rationale .  Treatment Plan Summary: Encourage ongoing group and milieu participation to work on coping skills and symptom reduction . Continue Zoloft 50 mgrs QDAY for depression and anxiety- side effects reviewed  Continue   Klonopin 0.5 mg Q8H PRN anxiety as needed Increase Gabapentin 600 mg TID for  Anxiety and cerebral palsy pain Continue Seroquel 200 mg po QHS for mood Disorder  Start Depakote 562m at bedtime.--potential benefits and side effects along with alternatives explained patient Patient expressing interest in family meeting with her mother, but not sure if she would come- CSW  to contact mother to try to arrange.    URuffin Frederick MD  05/18/2016, 1:30 PM

## 2016-05-19 NOTE — Progress Notes (Signed)
Staff made aware that patient is calling over to female and female peer on alternate hallway. Patient has exhibited this behavior throughout her admission demonstrating poor boundaries.  Oncoming charge RN, Selena BattenKim B notified who will share information in huddle. Off going staff on hallways made aware. Patient again strongly encouraged to focus on self and her treatment plan.   Patient verbalized understanding however continues to engage in this behavior.

## 2016-05-19 NOTE — Plan of Care (Signed)
Problem: Coping: Goal: Ability to verbalize frustrations and anger appropriately will improve Outcome: Progressing Pt stated she felt better after she performed breathing techniques learned earlier

## 2016-05-19 NOTE — Progress Notes (Signed)
D: [name] rates Anxiety 9/10 and Depression 4/10. Her goal today "was to try and get my meds regulated. So far it's not working. My anxiety has been through the roof today. Denies SI/HI/AVH at this time. Contracts for safety.  A: Encouragement and support given. Q15 minute room checks for patient safety. Medications administered as prescribed.  R: Continue to monitor for patient safety and medication effectiveness.

## 2016-05-19 NOTE — Plan of Care (Signed)
Problem: Education: Goal: Emotional status will improve Outcome: Not Progressing Patient continues to complain of extreme anxiety.  Problem: Safety: Goal: Ability to disclose and discuss suicidal ideas will improve Outcome: Progressing Patient denies SI, thoughts to harm self.

## 2016-05-19 NOTE — Progress Notes (Signed)
Recreation Therapy Notes  Date: 05/19/16 Time: 0930 Location: [address] Group Room  Group Topic: Stress Management  Goal Area(s) Addresses:  Patient will verbalize importance of using healthy stress management.  Patient will identify positive emotions associated with healthy stress management.   Intervention: Stress Management   Activity :  Progressive Muscle Relaxation.  LRT introduced the technique of progressive muscle relaxation to patients.  Patients were to follow along with LRT as script was read to participate in activity.  Education:  Stress Management, Discharge Planning.   Education Outcome: Needs additional education  Clinical Observations/Feedback: Pt did not attend group.   Ariday Brinker, LRT/CTRS         Kreed Kauffman A 05/19/2016 3:17 PM 

## 2016-05-19 NOTE — [facility] Group Notes (Signed)
[facility] LCSW Group Therapy 05/19/2016 1:15 PM  Type of Therapy: Group Therapy- Emotion Regulation  Participation Level: Active   Participation Quality:  Appropriate  Affect: Appropriate  Cognitive: Alert and Oriented   Insight:  Developing/Improving  Engagement in Therapy: Developing/Improving and Engaged   Modes of Intervention: Clarification, Confrontation, Discussion, Education, Exploration, Limit-setting, Orientation, Problem-solving, Rapport Building, Dance movement psychotherapisteality Testing, Socialization and Support  Summary of Progress/Problems: The topic for group today was emotional regulation. This group focused on both positive and negative emotion identification and allowed group members to process ways to identify feelings, regulate negative emotions, and find healthy ways to manage internal/external emotions. Group members were asked to reflect on a time when their reaction to an emotion led to a negative outcome and explored how alternative responses using emotion regulation would have benefited them. Group members were also asked to discuss a time when emotion regulation was utilized when a negative emotion was experienced. Pt identified triggers for her anger and discussed the need to have a plan to deal with triggers than she cannot avoid.   Caitlin CordialLauren Bell, LCSWA 05/19/2016 5:23 PM

## 2016-05-19 NOTE — Progress Notes (Signed)
Patient attended wrap up group but did not want to say anything.

## 2016-05-19 NOTE — Progress Notes (Signed)
D: Patient up and visible in the milieu. Spoke with patient 1:1. Rates sleep as good, appetite as fair, energy low and concentration poor Patient's affect anxious, mood congruent. Rating her depression at a 510, hopelessness at a 6/10 and anxiety at a 10/10. States goal for today is to "work on getting better, think positive." Denies pain, physical problems initially however experienced nausea and anxiety shortly before lunch.   A: Medicated per orders, klonopin and zofran prn given before lunch. Emotional support offered and self inventory reviewed.    R: On reassess, patient reports relief. Patient denies SI/HI and remains safe on level III obs.

## 2016-05-19 NOTE — Progress Notes (Signed)
Patient ID: Caitlin Bell, female   DOB: [birth year], 25 y.o.   MRN: [id number]  [facility] MD Progress Note  05/19/2016 3:12 PM Caitlin Bell  MRN:  [id number]  Subjective:   Patient reports feeling better today than yesterday. Anxiety and depression are less and she denies any visual hallucination. She admits to some nausea this morning but does understand that depakote may be causing this. She is willing to stay on the 546m  dose for tonight to see if the side effect  before a decision is made tomorrow morning   Objective: I have met with patient and have discussed case with treatment team. Patient reports  improvement but reports an ongoing sense of sadness and depression.   Alert, fully oriented and cooperative. Speech is coherent and goal directed, Mood is less labile, affect is less depressed. She denies hallucination. Denies suicidal or homicidal ideation and expresses no delusional ideas, Insight and judgemen are improving  Principal Problem: Bipolar I disorder, most recent episode depressed, severe w psychosis ([place] Diagnosis:   Patient Active Problem List   Diagnosis Date Noted  . Bipolar I disorder, most recent episode depressed, severe w psychosis ([place] [F31.5] 05/13/2016  . Affective psychosis, bipolar ([place] [F31.9]   . Bipolar affective disorder, depressed, severe ([place] [F31.4] 03/01/2016  . Bipolar affective disorder, current episode severe ([place] [F31.9] 03/01/2016  . Bipolar affective disorder, depressed, severe, with psychotic behavior ([place] [F31.5] 02/02/2016  . Normal labor [O80, Z37.9] 10/31/2014  . Bipolar disorder ([place] [F31.9] 10/31/2014  . Cholestasis of pregnancy [O26.619, K83.1] 10/31/2014  . H/O anorexia nervosa [Z86.59] 10/31/2014  . H/O bulimia nervosa [Z86.59] 10/31/2014  . Gestational diabetes [O24.419] 10/31/2014  . Depression--on Latuda [F32.9] 10/31/2014  . Umbilical hernia [[X32.3]10/31/2014  . Hx of preterm delivery  [O09.219] 10/31/2014  . Preterm  delivery [O60.10X0] 10/31/2014  . Generalized anxiety disorder [F41.1] 10/31/2014  . History of marijuana use [Z87.898] 10/31/2014  . Cerebral palsy (HH[place] [G80.9] 10/31/2014  . [redacted] weeks gestation of pregnancy [Z3A.35]   . Hx of PTL (preterm labor), current pregnancy [O09.219] 10/30/2014  . Preterm labor [O60.10X0] 10/30/2014  . Back pain complicating pregnancy in third trimester [O26.893, M54.9] 10/14/2014   Total Time spent with patient: 25 minutes   Past Psychiatric History: see HPI  Past Medical History:  Past Medical History:  Diagnosis Date  . Anxiety   . Bipolar disorder ([place]   . 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 Depression   . Gestational diabetes     Past Surgical History:  Procedure Laterality Date  . CHOLECYSTECTOMY  2014  . HERNIA REPAIR     Family History: History reviewed. No pertinent family history. Family Psychiatric  History: see HPI Social History:  History  Alcohol Use  . Yes    Comment: ocasionally, not with the pregnancy     History  Drug Use No    Comment: hx of marijuana use 3 weeks ago    Social History   Social History  . Marital status: Single    Spouse name: N/A  . Number of children: N/A  . Years of education: N/A   Social History Main Topics  . Smoking status: Former Smoker    Quit date: 10/31/2011  . Smokeless tobacco: Never Used  . Alcohol use Yes     Comment: ocasionally, not with the pregnancy  . Drug use: No     Comment: hx of marijuana use 3 weeks ago  . Sexual  activity: Yes    Birth control/ protection: None   Other Topics Concern  . None   Social History Narrative  . None   Additional Social History:     Sleep: Poor  Appetite:  Good  Current Medications: Current Facility-Administered Medications  Medication Dose Route Frequency Provider Last Rate Last Dose  . acetaminophen (TYLENOL) tablet 650 mg  650 mg Oral Q4H PRN Lurena Nida, NP   650 mg at 05/13/16 2220  . clonazePAM (KLONOPIN)  tablet 0.5 mg  0.5 mg Oral TID PRN Kerrie Buffalo, NP   0.5 mg at 05/19/16 1155  . divalproex (DEPAKOTE ER) 24 hr tablet 500 mg  500 mg Oral QHS Sueanne Margarita, MD   500 mg at 05/18/16 2155  . feeding supplement (BOOST / RESOURCE BREEZE) liquid 1 Container  1 Container Oral Q24H Jenne Campus, MD   1 Container at 05/18/16 2156  . gabapentin (NEURONTIN) capsule 600 mg  600 mg Oral TID Sueanne Margarita, MD   600 mg at 05/19/16 1153  . ondansetron (ZOFRAN) tablet 4 mg  4 mg Oral Q8H PRN Lurena Nida, NP   4 mg at 05/19/16 1155  . QUEtiapine (SEROQUEL) tablet 200 mg  200 mg Oral QHS Mordecai Maes, NP   200 mg at 05/18/16 2155  . sertraline (ZOLOFT) tablet 50 mg  50 mg Oral Daily Jenne Campus, MD   50 mg at 05/19/16 0815    Lab Results:  No results found for this or any previous visit (from the past 65 hour(s)).  Blood Alcohol level:  Lab Results  Component Value Date   ETH <5 05/13/2016   ETH <5 28/78/6767    Metabolic Disorder Labs: Lab Results  Component Value Date   HGBA1C 5.2 05/14/2016   MPG 103 05/14/2016   MPG 114 02/04/2016   Lab Results  Component Value Date   PROLACTIN 32.8 (H) 02/04/2016   Lab Results  Component Value Date   CHOL 135 05/14/2016   TRIG 61 05/14/2016   HDL 58 05/14/2016   CHOLHDL 2.3 05/14/2016   VLDL 12 05/14/2016   LDLCALC 65 05/14/2016   LDLCALC 69 02/04/2016    Physical Findings: AIMS: Facial and Oral Movements Muscles of Facial Expression: None, normal Lips and Perioral Area: None, normal Jaw: None, normal Tongue: None, normal,Extremity Movements Upper (arms, wrists, hands, fingers): None, normal Lower (legs, knees, ankles, toes): None, normal, Trunk Movements Neck, shoulders, hips: None, normal, Overall Severity Severity of abnormal movements (highest score from questions above): None, normal Incapacitation due to abnormal movements: None, normal Patient's awareness of abnormal movements (rate only patient's report): No  Awareness, Dental Status Current problems with teeth and/or dentures?: No Does patient usually wear dentures?: No  CIWA:    COWS:     Musculoskeletal: Strength & Muscle Tone: within normal limits Gait & Station: normal Patient leans: N/A  Psychiatric Specialty Exam: Physical Exam  Nursing note and vitals reviewed. Constitutional: She appears well-developed.  HENT:  Head: Normocephalic.  Eyes: Pupils are equal, round, and reactive to light.  Neck: Normal range of motion.  Cardiovascular: Normal rate.   Respiratory: Effort normal.  GI: Soft.  Genitourinary: Vagina normal.  Musculoskeletal: Normal range of motion.  Neurological: She is alert.  Skin: Skin is warm.  Psychiatric: She has a normal mood and affect. Her speech is normal and behavior is normal. Judgment and thought content normal. Cognition and memory are normal.    Review of Systems  Constitutional:  Negative.   HENT: Negative.   Eyes: Negative.   Respiratory: Negative.   Cardiovascular: Negative.   Gastrointestinal: Negative.   Genitourinary: Negative.   Musculoskeletal: Negative.   Skin: Negative.   Neurological: Negative.   Endo/Heme/Allergies: Negative.   Psychiatric/Behavioral: Positive for depression. Negative for hallucinations, memory loss, substance abuse and suicidal ideas. The patient is nervous/anxious and has insomnia.   All other systems reviewed and are negative.   Blood pressure (!) 89/67, pulse 97, temperature 98.1 F (36.7 C), temperature source Oral, resp. rate 16, height 5' 3"  (1.6 m), weight 56.7 kg (125 lb), last menstrual period 04/29/2016, SpO2 100 %, not currently breastfeeding.Body mass index is 22.14 kg/m.  General Appearance: improved grooming   Eye Contact:  Good  Speech:  Clear and Coherent  Volume:  Normal  Mood: remains depressed, affect vaguely constricted, anxious,but reactive   Affect:  As above   Thought Process:  Coherent  Orientation:  Full (Time, Place, and Person)   Thought Content:  Rumination, mainly about her children, custody issues   Suicidal Thoughts:  No denies suicidal plan or intention and contracts for safety on the unit   Homicidal Thoughts:  No denies any homicidal ideations   Memory:  Immediate;   Good Recent;   Good Remote;   Good  Judgement:  Good  Insight:  Good  Psychomotor Activity:  Normal  Concentration:  Concentration: Good and Attention Span: Good  Recall:  Good  Fund of Knowledge:  Good  Language:  Good  Akathisia:  Negative  Handed:  Right  AIMS (if indicated):     Assets:  Physical Health  ADL's:  Intact  Cognition:  WNL  Sleep:  Number of Hours: 5.75    Assessment - patient reports some ongoing depression, anxiety, although does feel that medications are helping partially and denies side effects at this time . Continues to ruminate about not having custody of her children, as her major stressor. Visible on unit, interacting appropriately with peers .Marland Kitchen We discussed medication options, agrees to adding Zoloft to current regimen in an effort to address mood, depression, anxiety We reviewed side effects and rationale .  Treatment Plan Summary: Encourage ongoing group and milieu participation to work on coping skills and symptom reduction . Continue Zoloft 50 mgrs QDAY for depression and anxiety- side effects reviewed  Continue   Klonopin 0.5 mg Q8H PRN anxiety as needed Continue Gabapentin 600 mg TID for  Anxiety and cerebral palsy pain Continue Seroquel 200 mg po QHS for mood Disorder  Continue Depakote 587m at bedtime.--potential benefits and side effects along with alternatives explained patient Patient expressing interest in family meeting with her mother, but not sure if she would come- CSW to contact mother to try to arrange.    URuffin Frederick MD  05/19/2016, 3:12 PM

## 2016-05-19 NOTE — Progress Notes (Signed)
D: Pt denies SI/HI/AVH. Pt is pleasant and cooperative. Pt stated she was very anxious.  A: Pt was offered support and encouragement.pt was shown various breathing techniques to reduce her anxiety Pt Pt was given scheduled medications. Pt was encourage to attend groups. Q 15 minute checks were done for safety.   R:Pt attends groups and interacts well with peers and staff. Pt is taking medication. Pt has no complaints.Pt receptive to treatment and safety maintained on unit. Pt stated she used some of the breathing techniques and they helped her anxiety.

## 2016-05-20 NOTE — ED Provider Notes (Signed)
[facility]-EMERGENCY DEPT Provider Note   CSN: [id number] Arrival date & time: 05/13/16  0129     History   Chief Complaint Chief Complaint  Patient presents with  . Medical Clearance  . Suicidal  . Manic Behavior    HPI Caitlin Bell is a 25 y.o. female.  Patient with a significant psychiatric history presents with suicidal thoughts. She reports stopping her medications secondary to nausea about one month ago. She denies self harm prior to arrival. No hallucinations or HI. She is here with parents.     The history is provided by the patient. No language interpreter was used.    Past Medical History:  Diagnosis Date  . Anxiety   . Bipolar disorder (HCC)   . 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 Depression   . Gestational diabetes     Patient Active Problem List   Diagnosis Date Noted  . Bipolar I disorder, most recent episode depressed, severe w psychosis (HCC) 05/13/2016  . Affective psychosis, bipolar (HCC)   . Bipolar affective disorder, depressed, severe (HCC) 03/01/2016  . Bipolar affective disorder, current episode severe (HCC) 03/01/2016  . Bipolar affective disorder, depressed, severe, with psychotic behavior (HCC) 02/02/2016  . Normal labor 10/31/2014  . Bipolar disorder (HCC) 10/31/2014  . Cholestasis of pregnancy 10/31/2014  . H/O anorexia nervosa 10/31/2014  . H/O bulimia nervosa 10/31/2014  . Gestational diabetes 10/31/2014  . Depression--on Kasandra KnudsenLatuda 10/31/2014  . Umbilical hernia 10/31/2014  . Hx of preterm delivery  10/31/2014  . Preterm delivery 10/31/2014  . Generalized anxiety disorder 10/31/2014  . History of marijuana use 10/31/2014  . Cerebral palsy (HCC) 10/31/2014  . [redacted] weeks gestation of pregnancy   . Hx of PTL (preterm labor), current pregnancy 10/30/2014  . Preterm labor 10/30/2014  . Back pain complicating pregnancy in third trimester 10/14/2014    Past Surgical History:  Procedure Laterality Date  . CHOLECYSTECTOMY   2014  . HERNIA REPAIR      OB History    Gravida Para Term Preterm AB Living   4 3 1 2  0 3   SAB TAB Ectopic Multiple Live Births   0 0 0 0 3       Home Medications    Prior to Admission medications   Medication Sig Start Date End Date Taking? Authorizing Provider  clonazePAM (KLONOPIN) 0.5 MG tablet Take 1 tablet (0.5 mg total) by mouth 3 (three) times daily as needed (anxiety/ insomnia). 03/06/16  Yes Shuvon B Rankin, NP  gabapentin (NEURONTIN) 100 MG capsule Take 2 capsules (200 mg total) by mouth 3 (three) times daily. 03/08/16  Yes Adonis BrookSheila Agustin, NP  iloperidone (FANAPT) 4 MG TABS tablet Take 0.5 tablets (2 mg total) by mouth 2 (two) times daily. 03/06/16  Yes Shuvon B Rankin, NP  traZODone (DESYREL) 100 MG tablet Take 1 tablet (100 mg total) by mouth at bedtime as needed for sleep. Patient not taking: Reported on 05/13/2016 03/06/16   Talmage NapShuvon B Rankin, NP    Family History History reviewed. No pertinent family history.  Social History Social History  Substance Use Topics  . Smoking status: Former Smoker    Quit date: 10/31/2011  . Smokeless tobacco: Never Used  . Alcohol use Yes     Comment: ocasionally, not with the pregnancy     Allergies   Review of patient's allergies indicates no known allergies.   Review of Systems Review of Systems  Constitutional: Negative for activity change and  appetite change.  Respiratory: Negative.  Negative for shortness of breath.   Cardiovascular: Negative.  Negative for chest pain.  Gastrointestinal: Negative.  Negative for abdominal pain and vomiting.  Musculoskeletal: Negative.  Negative for myalgias.  Neurological: Negative.   Psychiatric/Behavioral: Positive for dysphoric mood and suicidal ideas.     Physical Exam Updated Vital Signs BP 97/59 (BP Location: Left Arm)   Pulse 70   Temp 97.7 F (36.5 C) (Oral)   Resp 18   LMP 04/29/2016   SpO2 100%   Physical Exam  Constitutional: She appears well-developed and  well-nourished.  HENT:  Head: Normocephalic.  Neck: Normal range of motion. Neck supple.  Cardiovascular: Normal rate and regular rhythm.   Pulmonary/Chest: Effort normal and breath sounds normal.  Abdominal: Soft. Bowel sounds are normal. There is no tenderness. There is no rebound and no guarding.  Musculoskeletal: Normal range of motion.  Neurological: She is alert. No cranial nerve deficit.  Skin: Skin is warm and dry. No rash noted.  Psychiatric: She is withdrawn. She expresses suicidal ideation.     ED Treatments / Results  Labs (all labs ordered are listed, but only abnormal results are displayed) Labs Reviewed  COMPREHENSIVE METABOLIC PANEL - Abnormal; Notable for the following:       Result Value   Potassium 3.2 (*)    Albumin 5.1 (*)    All other components within normal limits  ACETAMINOPHEN LEVEL - Abnormal; Notable for the following:    Acetaminophen (Tylenol), Serum <10 (*)    All other components within normal limits  ETHANOL  SALICYLATE LEVEL  CBC  URINE RAPID DRUG SCREEN, HOSP PERFORMED  I-STAT BETA HCG BLOOD, ED ([facility], [facility], [facility] ONLY)    EKG  EKG Interpretation None       Radiology No results found.  Procedures Procedures (including critical care time)  Medications Ordered in ED Medications - No data to display   Initial Impression / Assessment and Plan / ED Course  I have reviewed the triage vital signs and the nursing notes.  Pertinent labs & imaging results that were available during my care of the patient were reviewed by me and considered in my medical decision making (see chart for details).  Clinical Course    Patient here with SI requiring TTS consultation for appropriate treatment.   Final Clinical Impressions(s) / ED Diagnoses   Final diagnoses:  Bipolar I disorder, most recent episode depressed, severe w psychosis [facility](HCC)    New Prescriptions Discharge Medication List as of 05/13/2016 10:37 AM       Elpidio AnisShari Warrene Kapfer,  PA-C 05/20/16 [id number]    Alvira MondayErin Schlossman, MD 05/27/16 1328

## 2016-05-20 NOTE — Progress Notes (Signed)
Patient ID: Caitlin Bell, female   DOB: 1990/10/30, 25 y.o.   MRN: [id number]  [facility] MD Progress Note  05/20/2016 1:08 PM Caitlin Bell  MRN:  [id number]  Subjective:   Patient continues to report improved mood with no anxiety. No nausea reported today and she appears to be adjusting to the depakote dose . Care coordination done with the Social worker for discharge, social worker will arrange for transportation back to patient's county   Objective: I have met with patient and have discussed case with treatment team. She thinks she has improved with anxiety and depressive symptoms in remission and no suicidal ideation or hallucinations. She does not think having a family with her Mother will accomplish anything and does not think it is necessary. g  Principal Problem: Bipolar I disorder, most recent episode depressed, severe w psychosis ([place]) Diagnosis:   Patient Active Problem List   Diagnosis Date Noted  . Bipolar I disorder, most recent episode depressed, severe w psychosis ([place]) [F31.5] 05/13/2016  . Affective psychosis, bipolar ([place]) [F31.9]   . Bipolar affective disorder, depressed, severe ([place]) [F31.4] 03/01/2016  . Bipolar affective disorder, current episode severe ([place]) [F31.9] 03/01/2016  . Bipolar affective disorder, depressed, severe, with psychotic behavior ([place]) [F31.5] 02/02/2016  . Normal labor [O80, Z37.9] 10/31/2014  . Bipolar disorder ([place]) [F31.9] 10/31/2014  . Cholestasis of pregnancy [O26.619, K83.1] 10/31/2014  . H/O anorexia nervosa [Z86.59] 10/31/2014  . H/O bulimia nervosa [Z86.59] 10/31/2014  . Gestational diabetes [O24.419] 10/31/2014  . Depression--on [place] [F32.9] 10/31/2014  . Umbilical hernia [L39.0] 10/31/2014  . Hx of preterm delivery  [O09.219] 10/31/2014  . Preterm delivery [O60.10X0] 10/31/2014  . Generalized anxiety disorder [F41.1] 10/31/2014  . History of marijuana use [Z87.898] 10/31/2014  . Cerebral palsy ([place]) [G80.9] 10/31/2014  . [redacted]  weeks gestation of pregnancy [Z3A.35]   . Hx of PTL (preterm labor), current pregnancy [O09.219] 10/30/2014  . Preterm labor [O60.10X0] 10/30/2014  . Back pain complicating pregnancy in third trimester [O26.893, M54.9] 10/14/2014   Total Time spent with patient: 25 minutes   Past Psychiatric History: see HPI  Past Medical History:  Past Medical History:  Diagnosis Date  . Anxiety   . Bipolar disorder (Home)   . 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 Depression   . Gestational diabetes     Past Surgical History:  Procedure Laterality Date  . CHOLECYSTECTOMY  2014  . HERNIA REPAIR     Family History: History reviewed. No pertinent family history. Family Psychiatric  History: see HPI Social History:  History  Alcohol Use  . Yes    Comment: ocasionally, not with the pregnancy     History  Drug Use No    Comment: hx of marijuana use 3 weeks ago    Social History   Social History  . Marital status: Single    Spouse name: N/A  . Number of children: N/A  . Years of education: N/A   Social History Main Topics  . Smoking status: Former Smoker    Quit date: 10/31/2011  . Smokeless tobacco: Never Used  . Alcohol use Yes     Comment: ocasionally, not with the pregnancy  . Drug use: No     Comment: hx of marijuana use 3 weeks ago  . Sexual activity: Yes    Birth control/ protection: None   Other Topics Concern  . None   Social History Narrative  . None   Additional Social History:  Sleep:Good  Appetite:  Good  Current Medications: Current Facility-Administered Medications  Medication Dose Route Frequency Provider Last Rate Last Dose  . acetaminophen (TYLENOL) tablet 650 mg  650 mg Oral Q4H PRN Lurena Nida, NP   650 mg at 05/13/16 2220  . clonazePAM (KLONOPIN) tablet 0.5 mg  0.5 mg Oral TID PRN Kerrie Buffalo, NP   0.5 mg at 05/19/16 1155  . divalproex (DEPAKOTE ER) 24 hr tablet 500 mg  500 mg Oral QHS Sueanne Margarita, MD   500 mg at 05/19/16  2149  . feeding supplement (BOOST / RESOURCE BREEZE) liquid 1 Container  1 Container Oral Q24H Jenne Campus, MD   1 Container at 05/19/16 2150  . gabapentin (NEURONTIN) capsule 600 mg  600 mg Oral TID Sueanne Margarita, MD   600 mg at 05/20/16 0810  . ondansetron (ZOFRAN) tablet 4 mg  4 mg Oral Q8H PRN Lurena Nida, NP   4 mg at 05/19/16 1155  . QUEtiapine (SEROQUEL) tablet 200 mg  200 mg Oral QHS Mordecai Maes, NP   200 mg at 05/19/16 2149  . sertraline (ZOLOFT) tablet 50 mg  50 mg Oral Daily Jenne Campus, MD   50 mg at 05/20/16 0809    Lab Results:  No results found for this or any previous visit (from the past 84 hour(s)).  Blood Alcohol level:  Lab Results  Component Value Date   ETH <5 05/13/2016   ETH <5 06/26/3006    Metabolic Disorder Labs: Lab Results  Component Value Date   HGBA1C 5.2 05/14/2016   MPG 103 05/14/2016   MPG 114 02/04/2016   Lab Results  Component Value Date   PROLACTIN 32.8 (H) 02/04/2016   Lab Results  Component Value Date   CHOL 135 05/14/2016   TRIG 61 05/14/2016   HDL 58 05/14/2016   CHOLHDL 2.3 05/14/2016   VLDL 12 05/14/2016   LDLCALC 65 05/14/2016   LDLCALC 69 02/04/2016    Physical Findings: AIMS: Facial and Oral Movements Muscles of Facial Expression: None, normal Lips and Perioral Area: None, normal Jaw: None, normal Tongue: None, normal,Extremity Movements Upper (arms, wrists, hands, fingers): None, normal Lower (legs, knees, ankles, toes): None, normal, Trunk Movements Neck, shoulders, hips: None, normal, Overall Severity Severity of abnormal movements (highest score from questions above): None, normal Incapacitation due to abnormal movements: None, normal Patient's awareness of abnormal movements (rate only patient's report): No Awareness, Dental Status Current problems with teeth and/or dentures?: No Does patient usually wear dentures?: No  CIWA:    COWS:     Musculoskeletal: Strength & Muscle Tone: within  normal limits Gait & Station: normal Patient leans: N/A  Psychiatric Specialty Exam: Physical Exam  Nursing note and vitals reviewed. Constitutional: She appears well-developed.  HENT:  Head: Normocephalic.  Eyes: Pupils are equal, round, and reactive to light.  Neck: Normal range of motion.  Cardiovascular: Normal rate.   Respiratory: Effort normal.  GI: Soft.  Genitourinary: Vagina normal.  Musculoskeletal: Normal range of motion.  Neurological: She is alert.  Skin: Skin is warm.  Psychiatric: She has a normal mood and affect. Her speech is normal and behavior is normal. Judgment and thought content normal. Cognition and memory are normal.    Review of Systems  Constitutional: Negative.   HENT: Negative.   Eyes: Negative.   Respiratory: Negative.   Cardiovascular: Negative.   Gastrointestinal: Negative.   Genitourinary: Negative.   Musculoskeletal: Negative.   Skin: Negative.  Neurological: Negative.   Endo/Heme/Allergies: Negative.   Psychiatric/Behavioral: Positive for depression. Negative for hallucinations, memory loss, substance abuse and suicidal ideas. The patient is nervous/anxious and has insomnia.   All other systems reviewed and are negative.   Blood pressure (!) 94/57, pulse (!) 105, temperature 97.8 F (36.6 C), temperature source Oral, resp. rate 16, height 5' 3"  (1.6 m), weight 56.7 kg (125 lb), last menstrual period 04/29/2016, SpO2 100 %, not currently breastfeeding.Body mass index is 22.14 kg/m.  General Appearance: improved grooming   Eye Contact:  Good  Speech:  Clear and Coherent  Volume:  Normal  Mood: remains depressed, affect vaguely constricted, anxious,but reactive   Affect:  As above   Thought Process:  Coherent  Orientation:  Full (Time, Place, and Person)  Thought Content:  Rumination, mainly about her children, custody issues   Suicidal Thoughts:  No denies suicidal plan or intention and contracts for safety on the unit   Homicidal  Thoughts:  No denies any homicidal ideations   Memory:  Immediate;   Good Recent;   Good Remote;   Good  Judgement:  Good  Insight:  Good  Psychomotor Activity:  Normal  Concentration:  Concentration: Good and Attention Span: Good  Recall:  Good  Fund of Knowledge:  Good  Language:  Good  Akathisia:  Negative  Handed:  Right  AIMS (if indicated):     Assets:  Physical Health  ADL's:  Intact  Cognition:  WNL  Sleep:  Number of Hours: 6    Assessment - patient reports some ongoing depression, anxiety, although does feel that medications are helping partially and denies side effects at this time . Continues to ruminate about not having custody of her children, as her major stressor. Visible on unit, interacting appropriately with peers .Marland Kitchen We discussed medication options, agrees to adding Zoloft to current regimen in an effort to address mood, depression, anxiety We reviewed side effects and rationale .  Treatment Plan Summary: Encourage ongoing group and milieu participation to work on coping skills and symptom reduction . Continue Zoloft 50 mgrs QDAY for depression and anxiety- side effects reviewed  Continue   Klonopin 0.5 mg Q8H PRN anxiety as needed Continue Gabapentin 600 mg TID for  Anxiety and cerebral palsy pain Continue Seroquel 200 mg po QHS for mood Disorder  Continue Depakote 572m at bedtime.--potential benefits and side effects along with alternatives explained patient Patient  No longer want family meeting with her mother Discharge tomorrow   URuffin Frederick MD  05/20/2016, 1:08 PM

## 2016-05-20 NOTE — Progress Notes (Signed)
[facility] was called immediately to notify staff of unit restriction. Lillia AbedLindsay, [facility] Wilder GladeShawn T., and Fredderick PhenixMack W., ADON., made aware of pt restriction to the unit for 24 hours.

## 2016-05-20 NOTE — [facility] Group Notes (Signed)
[facility] 05/20/2016 1:15pm  Type of Therapy: Mental Health Association Presentation  Pt was present for short amount of time in group and then left; did not return.  Chad CordialLauren Carter, LCSWA 05/20/2016 2:18 PM

## 2016-05-20 NOTE — Progress Notes (Signed)
Patient denies SI, HI and AVH but reports increased anxiety and a low level of depression.  Patient had to be redirected several times by staff due to inappropriate contact with a peer, but cause patient was unable to follow staff direction patient was placed on Unit Restriction to aid in maintaining her safety.  Patient was informed of unit restriction and is aware that it will stay in place for a 24 hour period.  After being placed in unit restriction patient yelled down the hall to peer "I love you."  Patient was again reminded of the rules of the unit and she acknowledged understanding.   Assess patient for safety, offer medications as prescribed, engage patient in 1:1 staff talks.   Continue to monitor as prescribed, patient able to contract for safety.

## 2016-05-20 NOTE — Progress Notes (Signed)
Adult Psychoeducational Group Note  Date:  05/20/2016 Time:  11:15 PM  Group Topic/Focus:  Wrap-Up Group:   The focus of this group is to help patients review their daily goal of treatment and discuss progress on daily workbooks.   Participation Level:  Active  Participation Quality:  Appropriate  Affect:  Appropriate  Cognitive:  Alert  Insight: Appropriate  Engagement in Group:  Engaged  Modes of Intervention:  Discussion  Additional Comments:  On a scale between 1-10, (1=worse, 10=best) patient rated her day a 1, due to being on unit restriction. Patient goal for today was "to get discharges. Patient will be leaving tomorrow. Caitlin Bell L Caitlin Bell 05/20/2016, 11:15 PM

## 2016-05-21 MED ORDER — GABAPENTIN 300 MG PO CAPS: 600.0000 mg | ORAL_CAPSULE | Freq: Three times a day (TID) | ORAL | 0 refills | Status: DC

## 2016-05-21 MED ORDER — QUETIAPINE FUMARATE 200 MG PO TABS: 200.0000 mg | ORAL_TABLET | Freq: Every day | ORAL | 0 refills | Status: DC

## 2016-05-21 MED ORDER — SERTRALINE HCL 50 MG PO TABS: 50.0000 mg | ORAL_TABLET | Freq: Every day | ORAL | 0 refills | Status: DC

## 2016-05-21 MED ORDER — DIVALPROEX SODIUM ER 500 MG PO TB24: 500.0000 mg | ORAL_TABLET | Freq: Every day | ORAL | 0 refills | Status: DC

## 2016-05-21 NOTE — Progress Notes (Signed)
  [facility] :  Will you be returning to the same living situation after discharge:  Yes,  Pt returning home At discharge, do you have transportation home?: Yes,  Pt sister to pick up Do you have the ability to pay for your medications: Yes,  Pt provided with prescriptions  Release of information consent forms completed and in the chart;  Patient's signature needed at discharge.  Patient to Follow up at: Follow-up Information    [facility] .   Why:  [facility] 12:45pm [facility] 9/6 at 2:00pm with Okey Regalarol for medication management.  Contact information: [address] [place] [id number] [phone]           Next level of care provider has [facility] Link:no  Safety Planning and Suicide Prevention discussed: Yes, with Pt; declined family contact  Have you used any form of tobacco in the last 30 days? (Cigarettes, Smokeless Tobacco, Cigars, and/or Pipes): No  Has patient been referred to the Quitline?: N/A patient is not a smoker  Patient has been referred for addiction treatment: Yes  Adler Alton Lavonna RuaM Carter 05/21/2016, 9:00 AM

## 2016-05-21 NOTE — Progress Notes (Signed)
Patient continues on unit restriction at this time per previous shift report. Will continue to monitor.

## 2016-05-21 NOTE — Progress Notes (Signed)
D This writer observed pt lean over closest corner of nurses' station ( closest to the  400 hall med window) and yell .To the 300 hall " will you tell him I want to talk to him.  Tell him to look over here.ibuprofen gottat get the f--- [facility] this hospital". This nurse overheard this conversation and said to patient " please get back away from the desk. ". On 1:1 with this pt, this Clinical research associatewriter spoke with pt and reminded pt she could not go to the 300 hall for any reason, that she should not be yelling over staff...while trying to communicate with a 300 hall patient and by behaving like she is currently, she is violating her treatment aagreement. A Again, this writer asked pt to : "take a step back and please do not touch  him". Pt stated " this is so stupid....this is f----- up!!!".this is not right ". This RN explained to [facility] current situation and all staff encouraged to remain vigilent in kepping all pt flat and safe. R Safety in palce

## 2016-05-21 NOTE — Tx Team (Signed)
Interdisciplinary Treatment Plan Update (Adult) Date: 05/13/2016   Date: 05/13/2016 3:34 PM  Progress in Treatment:  Attending groups: Yes   Participating in groups: Yes Taking medication as prescribed: Yes  Tolerating medication: Yes  Family/Significant othe contact made: No, Pt declines Patient understands diagnosis: Yes AEB seeking help for depression Discussing patient identified problems/goals with staff: Yes  Medical problems stabilized or resolved: Yes  Denies suicidal/homicidal ideation: Yes  Patient has not harmed self or Others: Yes   New problem(s) identified: None identified at this time.   Discharge Plan or Barriers: Pt will return home and follow-up with outpatient services.   Additional comments:  Patient and CSW reviewed pt's identified goals and treatment plan. Patient verbalized understanding and agreed to treatment plan. CSW reviewed [facility] "Discharge Process and Patient Involvement" Form. Pt verbalized understanding of information provided and signed form.   Reason for Continuation of Hospitalization:  Anxiety Depression Medication stabilization Suicidal ideation  Estimated length of stay: 0 days  Review of initial/current patient goals per problem list:   1.  Goal(s): Patient will participate in aftercare plan  Met:  Yes  Target date: 3-5 days from date of admission   As evidenced by: Patient will participate within aftercare plan AEB aftercare provider and housing plan at discharge being identified.  05/13/16: CSW to work with Pt to assess for appropriate discharge plan and faciliate appointments and referrals as needed prior to d/c. 05/18/16: Pt will return home and follow-up with outpatient services.   2.  Goal (s): Patient will exhibit decreased depressive symptoms and suicidal ideations.  Met:  Adequate for DC  Target date: 3-5 days from date of admission   As evidenced by: Patient will utilize self rating of depression at 3 or below and demonstrate  decreased signs of depression or be deemed stable for discharge by MD. 05/13/16: Pt was admitted with symptoms of depression, rating 10/10. Pt continues to present with flat affect and depressive symptoms.  Pt will demonstrate decreased symptoms of depression and rate depression at 3/10 or lower prior to discharge. 05/18/16: Pt rates depression at 5/10; denies SI 05/21/2016: MD feels that Pt's symptoms have decreased to the point that they can be managed in an outpatient setting.   3.  Goal(s): Patient will demonstrate decreased signs and symptoms of anxiety.  Met:  Adequate for DC  Target date: 3-5 days from date of admission   As evidenced by: Patient will utilize self rating of anxiety at 3 or below and demonstrated decreased signs of anxiety, or be deemed stable for discharge by MD 05/13/16: Pt was admitted with increased levels of anxiety and is currently rating those symptoms highly. Pt will demonstrated decreased symptoms of anxiety and rate it at 3/10 prior to d/c. 05/18/16: Pt rates anxiety at 7/10.  05/21/16: MD feels that Pt's symptoms have decreased to the point that they can be managed in an outpatient setting.    Attendees:  Patient:    Family:    Physician: Dr. Julieanne Manson, MD  05/13/2016 3:34 PM  Nursing: Idell Pickles, RN 05/13/2016 3:34 PM  Clinical Social Worker Peri Maris, [place] 05/13/2016 3:34 PM  Other: Tilden Fossa, [place] 05/13/2016 3:34 PM  Clinical:   05/13/2016 3:34 PM  Other:  05/13/2016 3:34 PM  Other:     Peri Maris, [place] Social Work [phone]

## 2016-05-21 NOTE — Progress Notes (Signed)
Patient ID: Margrett RudVictoria L Bell, female   DOB: 12/19/1990, 25 y.o.   MRN: [id number] Patient was discharged to home/selfcare.  Upon discharge patient was noted to be anxious, but prepared for discharge.  Patient had been on unit restriction prior to discharge due to inappropriate contact.  Patient was encouraged to go straight to her mother's job per her discharge plan.  Patient was encouraged to focus on her discharge plans.  Patient acknowledged understanding of discharge plans and receipt of all of her belongings upon discharge. Patient was able to contract for safety upon discharge.

## 2016-05-21 NOTE — [facility] Suicide Risk Assessment (Signed)
[facility] INPATIENT:  Family/Significant Other Suicide Prevention Education  Suicide Prevention Education:  Patient Refusal for Family/Significant Other Suicide Prevention Education: The patient Caitlin Bell has refused to provide written consent for family/significant other to be provided Family/Significant Other Suicide Prevention Education during admission and/or prior to discharge.  Physician notified.  Elaina HoopsLauren M Carter 05/21/2016, 10:56 AM

## 2016-05-21 NOTE — Discharge Summary (Signed)
Physician Discharge Summary Note  Patient:  Caitlin Bell is an 25 y.o., female MRN:  [id number] DOB:  07/22/1991 Patient phone:  [phone] (home)  Patient address:   [address] [place] [address],  Total Time spent with patient: 30 minutes  Date of Admission:  05/13/2016 Date of Discharge: 05/21/2016  Reason for Admission:  Suicidal ideations, self cutting  Principal Problem: Bipolar I disorder, most recent episode depressed, severe w psychosis [facility](HCC) Discharge Diagnoses: Patient Active Problem List   Diagnosis Date Noted  . Bipolar I disorder, most recent episode depressed, severe w psychosis (HCC) [F31.5] 05/13/2016    Priority: High  . Affective psychosis, bipolar (HCC) [F31.9]   . Bipolar affective disorder, depressed, severe (HCC) [F31.4] 03/01/2016  . Bipolar affective disorder, current episode severe (HCC) [F31.9] 03/01/2016  . Bipolar affective disorder, depressed, severe, with psychotic behavior (HCC) [F31.5] 02/02/2016  . Normal labor [O80, Z37.9] 10/31/2014  . Bipolar disorder (HCC) [F31.9] 10/31/2014  . Cholestasis of pregnancy [O26.619, K83.1] 10/31/2014  . H/O anorexia nervosa [Z86.59] 10/31/2014  . H/O bulimia nervosa [Z86.59] 10/31/2014  . Gestational diabetes [O24.419] 10/31/2014  . Depression--on Latuda [F32.9] 10/31/2014  . Umbilical hernia [K42.9] 10/31/2014  . Hx of preterm delivery  [O09.219] 10/31/2014  . Preterm delivery [O60.10X0] 10/31/2014  . Generalized anxiety disorder [F41.1] 10/31/2014  . History of marijuana use [Z87.898] 10/31/2014  . Cerebral palsy (HCC) [G80.9] 10/31/2014  . [redacted] weeks gestation of pregnancy [Z3A.35]   . Hx of PTL (preterm labor), current pregnancy [O09.219] 10/30/2014  . Preterm labor [O60.10X0] 10/30/2014  . Back pain complicating pregnancy in third trimester [O26.893, M54.9] 10/14/2014    Past Psychiatric History: see HPI  Past Medical History:  Past Medical History:  Diagnosis Date  . Anxiety   . Bipolar  disorder (HCC)   . 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 Depression   . Gestational diabetes     Past Surgical History:  Procedure Laterality Date  . CHOLECYSTECTOMY  2014  . HERNIA REPAIR     Family History: History reviewed. No pertinent family history. Family Psychiatric  History: see HPI Social History:  History  Alcohol Use  . Yes    Comment: ocasionally, not with the pregnancy     History  Drug Use No    Comment: hx of marijuana use 3 weeks ago    Social History   Social History  . Marital status: Single    Spouse name: N/A  . Number of children: N/A  . Years of education: N/A   Social History Main Topics  . Smoking status: Former Smoker    Quit date: 10/31/2011  . Smokeless tobacco: Never Used  . Alcohol use Yes     Comment: ocasionally, not with the pregnancy  . Drug use: No     Comment: hx of marijuana use 3 weeks ago  . Sexual activity: Yes    Birth control/ protection: None   Other Topics Concern  . None   Social History Narrative  . None    Hospital Course:  Caitlin Bell, 25 yo, [facility], was admitted this time with similar presentation.  She reported worsening depression and developed thoughts of self cutting.  Caitlin Bell was admitted for Bipolar I disorder, most recent episode depressed, severe w psychosis (HCC) and crisis management.  She was treated with divalproex (DEPAKOTE ER) 500 MG 24 hr tablet mood stabilization, gabapentin (NEURONTIN) 600 MG capsule anxiety, QUEtiapine (SEROQUEL) 200 MG  tablet mood control and insomnia, sertraline (ZOLOFT) 50 MG tablet depression.  Medical problems were identified and treated as needed.  Home medications were restarted as appropriate.  Improvement was monitored by observation and Caitlin Bell daily report of symptom reduction.  Emotional and mental status was monitored by daily self inventory reports completed by Caitlin Bell and clinical staff.  Patient reported  continued improvement, denied any new concerns.  Patient had been compliant on medications and denied side effects.  Support and encouragement was provided.    Patient encouraged to attend groups to help with recognizing triggers of emotional crises and de-stabilizations.  Patient encouraged to attend group to help identify the positive things in life that would help in dealing with feelings of loss, depression and unhealthy or abusive tendencies.         Caitlin Bell was evaluated by the treatment team for stability and plans for continued recovery upon discharge.  She was offered further treatment options upon discharge including Residential, Intensive Outpatient and Outpatient treatment.  She will follow up with agencies listed below for medication management and counseling.  Encouraged patient to maintain satisfactory support network and home environment.  Advised to adhere to medication compliance and outpatient treatment follow up.  Prescriptions provided.       Caitlin Bell motivation was an integral factor for scheduling further treatment.  Employment, transportation, bed availability, health status, family support, and any pending legal issues were also considered during her hospital stay.  Upon completion of this admission the patient was both mentally and medically stable for discharge denying suicidal/homicidal ideation, auditory/visual/tactile hallucinations, delusional thoughts and paranoia.      Physical Findings: AIMS: Facial and Oral Movements Muscles of Facial Expression: None, normal Lips and Perioral Area: None, normal Jaw: None, normal Tongue: None, normal,Extremity Movements Upper (arms, wrists, hands, fingers): None, normal Lower (legs, knees, ankles, toes): None, normal, Trunk Movements Neck, shoulders, hips: None, normal, Overall Severity Severity of abnormal movements (highest score from questions above): None, normal Incapacitation due to abnormal movements:  None, normal Patient's awareness of abnormal movements (rate only patient's report): No Awareness, Dental Status Current problems with teeth and/or dentures?: No Does patient usually wear dentures?: No  CIWA:    COWS:     Musculoskeletal: Strength & Muscle Tone: within normal limits Gait & Station: normal Patient leans: N/A  Psychiatric Specialty Exam:  See HPI Physical Exam  Nursing note and vitals reviewed. Psychiatric: Her speech is normal and behavior is normal. Judgment and thought content normal. Her mood appears not anxious. She is not agitated. Thought content is not paranoid. Cognition and memory are normal. Cognition and memory are not impaired. She does not express impulsivity.    Review of Systems  Constitutional: Negative.  Negative for fever.  HENT: Negative.   Eyes: Negative.   Respiratory: Negative.  Negative for cough.   Cardiovascular: Negative.   Gastrointestinal: Negative.   Genitourinary: Negative.   Musculoskeletal: Negative.   Skin: Negative.  Negative for rash.  Neurological: Negative.  Negative for headaches.  Endo/Heme/Allergies: Negative.   Psychiatric/Behavioral: Negative.   All other systems reviewed and are negative.   Blood pressure 118/67, pulse 77, temperature 98.3 F (36.8 C), temperature source Oral, resp. rate 18, height 5\' 3"  (1.6 m), weight 56.7 kg (125 lb), last menstrual period 04/29/2016, SpO2 100 %, not currently breastfeeding.Body mass index is 22.14 kg/m.   Have you used any form of tobacco in the last 30 days? (Cigarettes, Smokeless Tobacco,  Cigars, and/or Pipes): No  Has this patient used any form of tobacco in the last 30 days? (Cigarettes, Smokeless Tobacco, Cigars, and/or Pipes) Yes, N/A  Blood Alcohol level:  Lab Results  Component Value Date   ETH <5 05/13/2016   ETH <5 02/29/2016    Metabolic Disorder Labs:  Lab Results  Component Value Date   HGBA1C 5.2 05/14/2016   MPG 103 05/14/2016   MPG 114 02/04/2016   Lab  Results  Component Value Date   PROLACTIN 32.8 (H) 02/04/2016   Lab Results  Component Value Date   CHOL 135 05/14/2016   TRIG 61 05/14/2016   HDL 58 05/14/2016   CHOLHDL 2.3 05/14/2016   VLDL 12 05/14/2016   LDLCALC 65 05/14/2016   LDLCALC 69 02/04/2016    See Psychiatric Specialty Exam and Suicide Risk Assessment completed by Attending Physician prior to discharge.  Discharge destination:  Home  Is patient on multiple antipsychotic therapies at discharge:  No   Has Patient had three or more failed trials of antipsychotic monotherapy by history:  No  Recommended Plan for Multiple Antipsychotic Therapies: NA     Medication List    STOP taking these medications   clonazePAM 0.5 MG tablet Commonly known as:  KLONOPIN   iloperidone 4 MG Tabs tablet Commonly known as:  FANAPT   traZODone 100 MG tablet Commonly known as:  DESYREL     TAKE these medications     Indication  divalproex 500 MG 24 hr tablet Commonly known as:  DEPAKOTE ER Take 1 tablet (500 mg total) by mouth at bedtime. For mood stabilization  Indication:  mood stabilization   gabapentin 300 MG capsule Commonly known as:  NEURONTIN Take 2 capsules (600 mg total) by mouth 3 (three) times daily. Anxiety control What changed:  medication strength  how much to take  additional instructions  Indication:  Agitation, anxiety   QUEtiapine 200 MG tablet Commonly known as:  SEROQUEL Take 1 tablet (200 mg total) by mouth at bedtime. MOOD CONTROL AND INSOMNIA  Indication:  mood stabilization   sertraline 50 MG tablet Commonly known as:  ZOLOFT Take 1 tablet (50 mg total) by mouth daily. depression  Indication:  Major Depressive Disorder      Follow-up Information    [facility] .   Why:  8/28 at 12:45pm for therapy with [facility]. 9/6 at 2:00pm with Okey Regal for medication management.  Contact information: [address] [place] [address] [phone]           Follow-up recommendations:   Activity:  as tol Diet:  as tol  Comments:  1.  Take all your medications as prescribed.   2.  Report any adverse side effects to outpatient provider. 3.  Patient instructed to not use alcohol or illegal drugs while on prescription medicines. 4.  In the event of worsening symptoms, instructed patient to call 911, the crisis hotline or go to nearest emergency room for evaluation of symptoms.  Signed: Lindwood Qua, NP [facility] 05/21/2016, 2:11 PM
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
## 2016-05-21 NOTE — Progress Notes (Addendum)
Recreation Therapy Notes  Date: 05/21/16 Time: 0930 Location: [address] Group Room  Group Topic: Stress Management  Goal Area(s) Addresses:  Patient will verbalize importance of using healthy stress management.  Patient will identify positive emotions associated with healthy stress management.   Intervention: Stress Management  Activity :  [employer] [facility] Guided Imagery.  LRT introduced patients to the technique of guided imagery.  Patients were to follow along as LRT read script to participate in stress management technique.     Education:  Stress Management, Discharge Planning.   Education Outcome:  Needs additional education  Clinical Observations/Feedback:  Pt did not attend group.    Caroll RancherMarjette Hend Mccarrell, LRT/CTRS   Caroll RancherLindsay, Rosalie Gelpi A 05/21/2016 12:47 PM

## 2016-05-23 ENCOUNTER — Encounter (HOSPITAL_COMMUNITY): Payer: Self-pay | Admitting: Psychiatry

## 2016-06-07 IMAGING — CT CT RENAL STONE PROTOCOL
3 of 4 series · 10 of 46 positions shown, 17 images · non-contrast
Comparison: None.

CLINICAL DATA: Sudden onset low back pain, 12 hours duration.

EXAM:
CT ABDOMEN AND PELVIS WITHOUT CONTRAST
TECHNIQUE: Multidetector CT imaging of the abdomen and pelvis was performed
following the standard protocol without IV contrast.

[Series 3: mpr coronal (id) · coronal · 0.86mm/px · 3 of 65 slices shown, 4 images]
[im 22/65  soft-tissue]
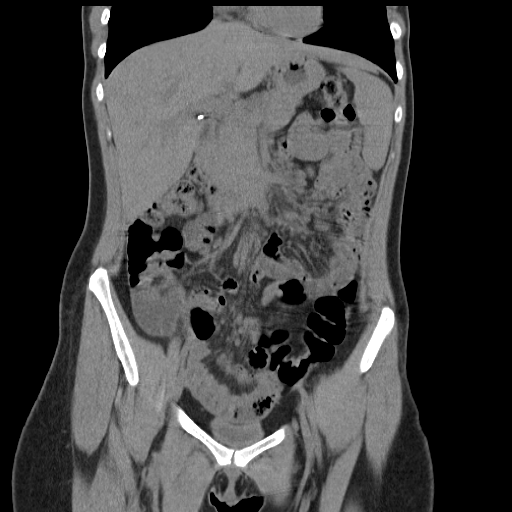
[im 29/65  soft-tissue]
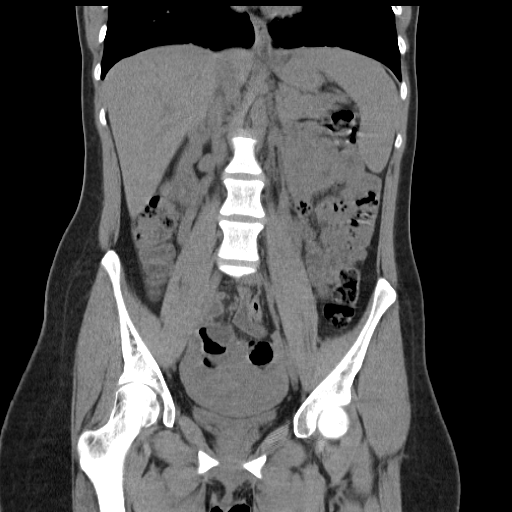
[im 29/65  bone]
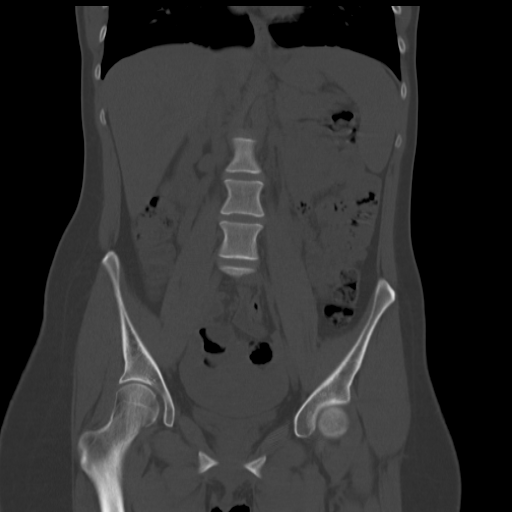
[im 36/65  soft-tissue]
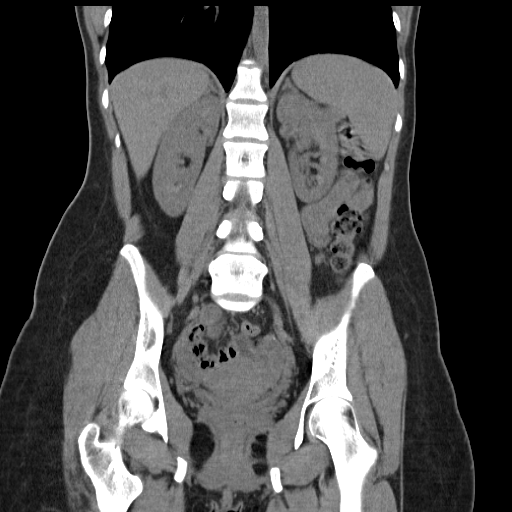

[Series 4: mpr sagittal (id) · sagittal · 0.42mm/px · 1 of 109 slices shown, 2 images]
[im 37/109  soft-tissue]
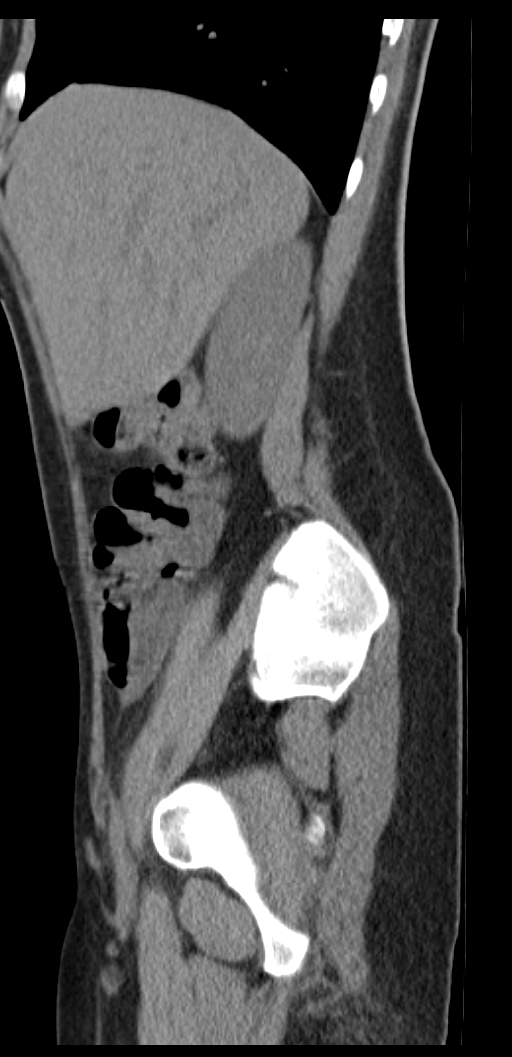
[im 37/109  bone]
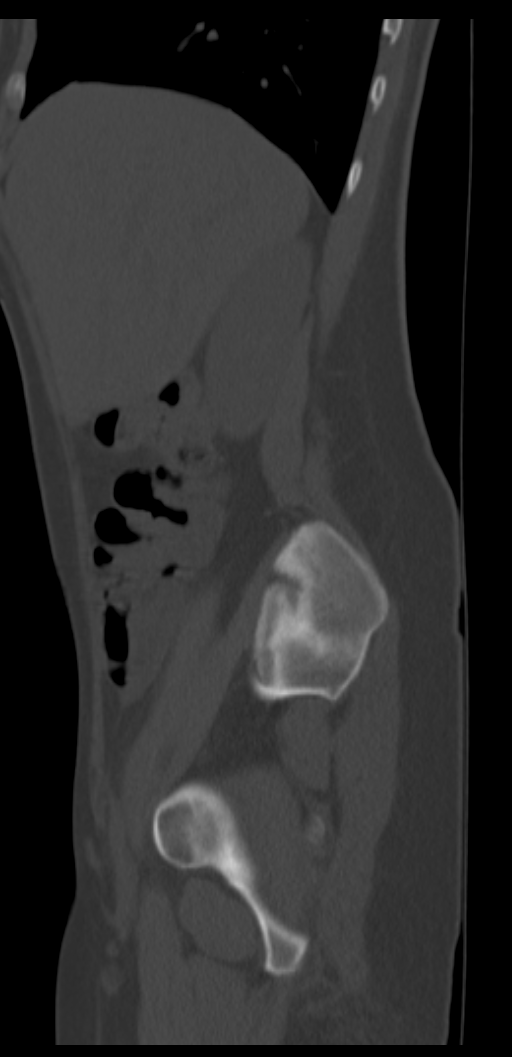

[Series 6: lung 5.0 b60f · axial · 0.70mm/px · z∈[-176,-76]mm · 6 of 30 slices shown, 11 images]
[im 5/30  soft-tissue]
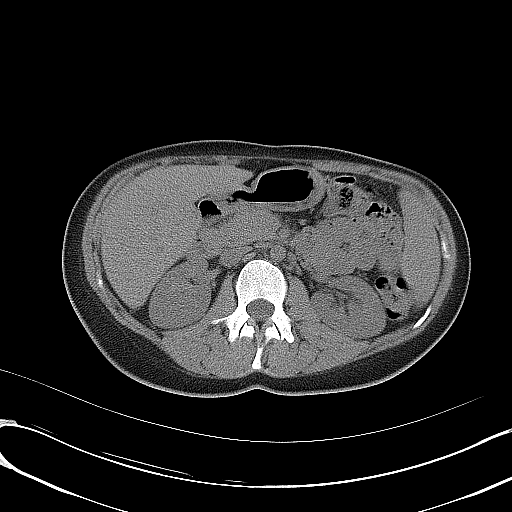
[im 5/30  bone]
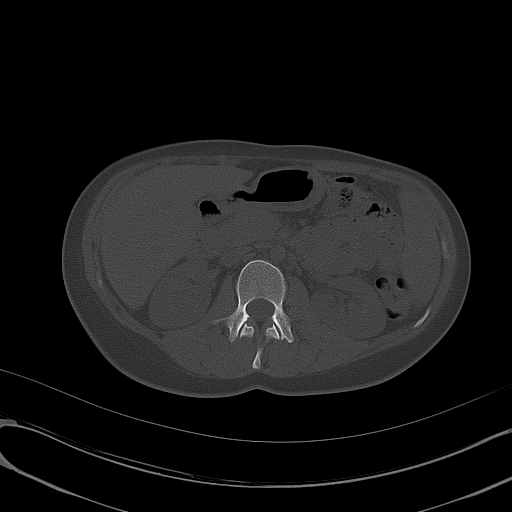
[im 9/30  soft-tissue]
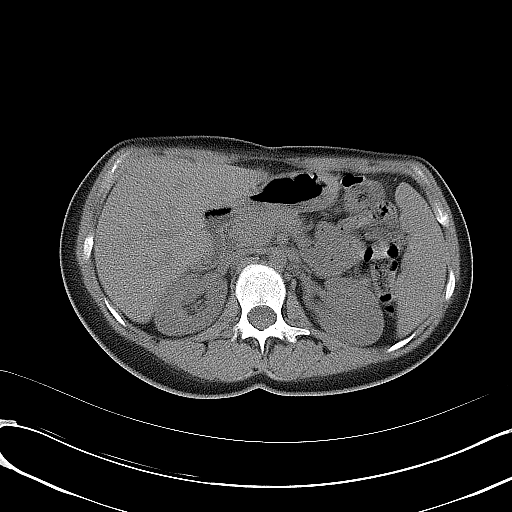
[im 13/30  soft-tissue]
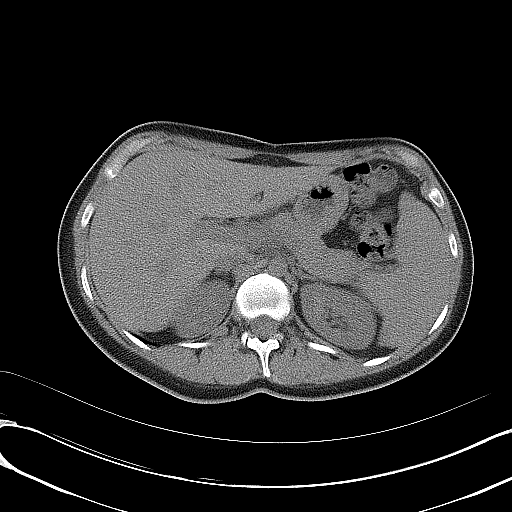
[im 13/30  lung]
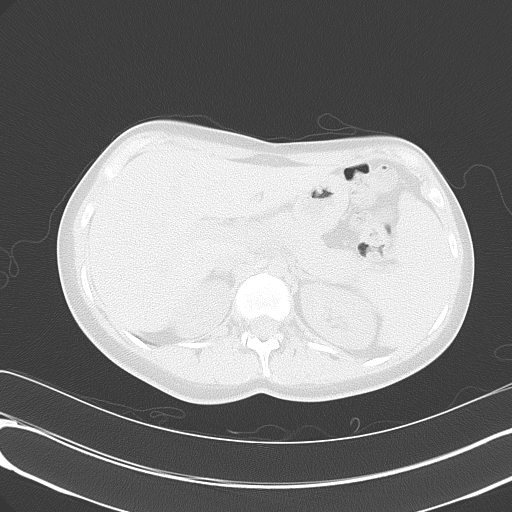
[im 17/30  soft-tissue]
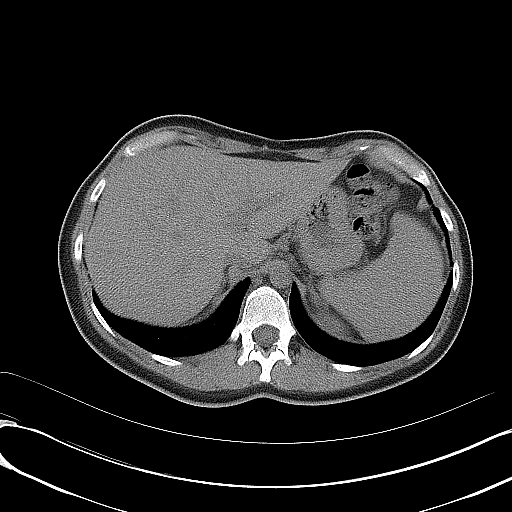
[im 17/30  lung]
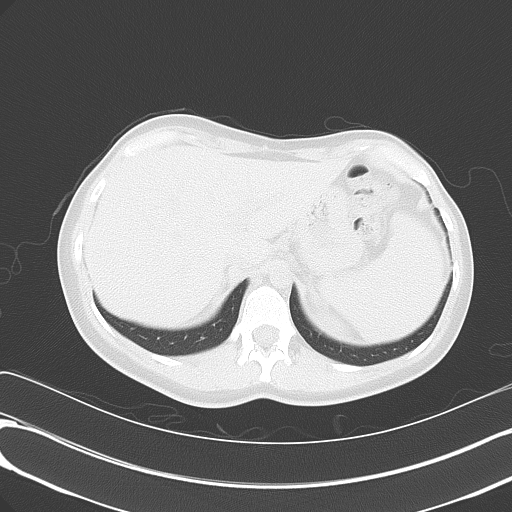
[im 21/30  soft-tissue]
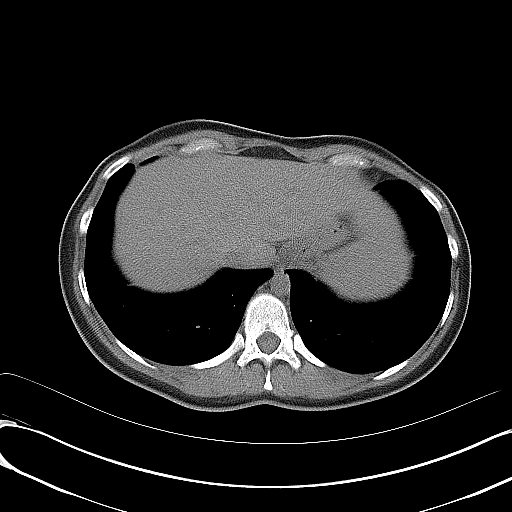
[im 21/30  lung]
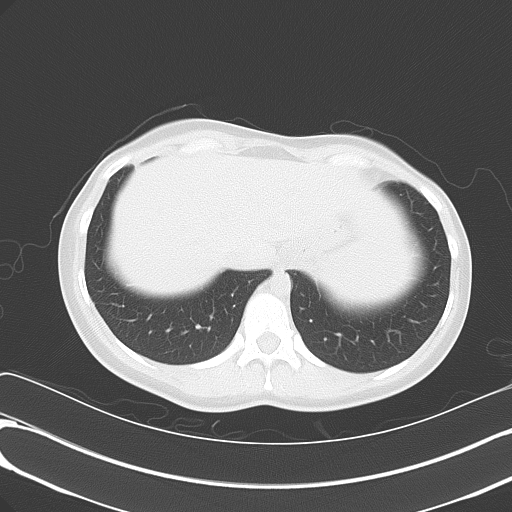
[im 25/30  soft-tissue]
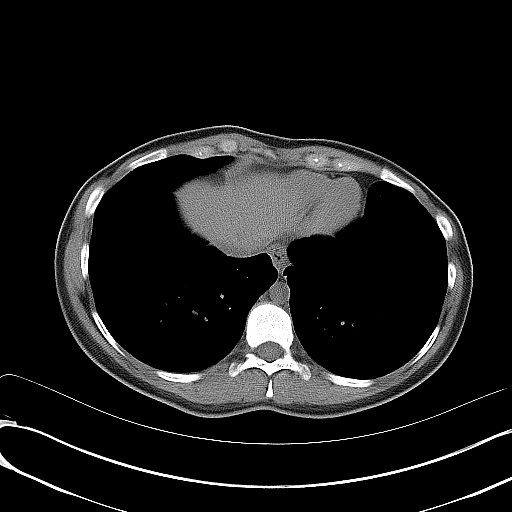
[im 25/30  lung]
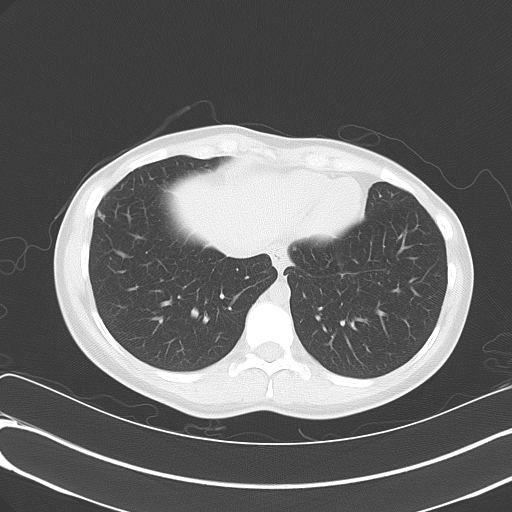

[10 of 46 positions shown; findings below may reference images not displayed]

FINDINGS: There are no urinary tract calculi. There is no hydronephrosis or
ureteral dilatation.

There are unremarkable unenhanced appearances of the liver, spleen,
pancreas, adrenals and kidneys. There is cholecystectomy. Bile ducts
are unremarkable. The abdominal aorta is normal in caliber. There is
no atherosclerotic calcification. There is no adenopathy in the
abdomen or pelvis. There are normal appearances of the stomach,
small bowel and colon. There is no bowel obstruction. There is no
extraluminal air. There is no acute inflammatory change in the
abdomen or pelvis. There is no ascites.

Uterus and adnexal structures are unremarkable.

There is no significant abnormality in the lower chest. There is no
significant musculoskeletal lesion.
IMPRESSION: No significant abnormality is evident in the abdomen or pelvis.

## 2016-06-23 ENCOUNTER — Other Ambulatory Visit (HOSPITAL_COMMUNITY): Payer: Self-pay | Admitting: Psychiatry

## 2016-08-12 ENCOUNTER — Encounter (HOSPITAL_COMMUNITY): Payer: Self-pay | Admitting: *Deleted

## 2016-08-12 ENCOUNTER — Inpatient Hospital Stay (HOSPITAL_COMMUNITY)
Admission: AD | Admit: 2016-08-12 | Discharge: 2016-08-19 | DRG: 885 | Disposition: A | Discharge status: Home / Self Care | Payer: No Typology Code available for payment source | Source: Intra-hospital | Attending: Psychiatry | Admitting: Psychiatry

## 2016-08-12 ENCOUNTER — Emergency Department (HOSPITAL_COMMUNITY)
Admission: EM | Admit: 2016-08-12 | Discharge: 2016-08-12 | Disposition: A | Discharge status: Home / Self Care | Payer: Medicaid Other | Attending: Emergency Medicine | Admitting: Emergency Medicine

## 2016-08-12 ENCOUNTER — Encounter (HOSPITAL_COMMUNITY): Payer: Self-pay

## 2016-08-12 DIAGNOSIS — Z87891 Personal history of nicotine dependence: Secondary | ICD-10-CM | POA: Insufficient documentation

## 2016-08-12 DIAGNOSIS — F329 Major depressive disorder, single episode, unspecified: Secondary | ICD-10-CM | POA: Diagnosis present

## 2016-08-12 DIAGNOSIS — F3132 Bipolar disorder, current episode depressed, moderate: Principal | ICD-10-CM | POA: Diagnosis present

## 2016-08-12 DIAGNOSIS — R45851 Suicidal ideations: Secondary | ICD-10-CM

## 2016-08-12 DIAGNOSIS — F332 Major depressive disorder, recurrent severe without psychotic features: Secondary | ICD-10-CM | POA: Diagnosis present

## 2016-08-12 DIAGNOSIS — R443 Hallucinations, unspecified: Secondary | ICD-10-CM | POA: Insufficient documentation

## 2016-08-12 DIAGNOSIS — F4000 Agoraphobia, unspecified: Secondary | ICD-10-CM | POA: Diagnosis present

## 2016-08-12 DIAGNOSIS — F411 Generalized anxiety disorder: Secondary | ICD-10-CM | POA: Diagnosis present

## 2016-08-12 DIAGNOSIS — R630 Anorexia: Secondary | ICD-10-CM | POA: Diagnosis present

## 2016-08-12 DIAGNOSIS — G4700 Insomnia, unspecified: Secondary | ICD-10-CM | POA: Diagnosis present

## 2016-08-12 DIAGNOSIS — Z79899 Other long term (current) drug therapy: Secondary | ICD-10-CM | POA: Diagnosis not present

## 2016-08-12 DIAGNOSIS — Z590 Homelessness: Secondary | ICD-10-CM

## 2016-08-12 DIAGNOSIS — Z8659 Personal history of other mental and behavioral disorders: Secondary | ICD-10-CM

## 2016-08-12 DIAGNOSIS — R440 Auditory hallucinations: Secondary | ICD-10-CM | POA: Diagnosis present

## 2016-08-12 DIAGNOSIS — N3000 Acute cystitis without hematuria: Secondary | ICD-10-CM

## 2016-08-12 DIAGNOSIS — F1099 Alcohol use, unspecified with unspecified alcohol-induced disorder: Secondary | ICD-10-CM | POA: Diagnosis not present

## 2016-08-12 DIAGNOSIS — Z9889 Other specified postprocedural states: Secondary | ICD-10-CM | POA: Diagnosis not present

## 2016-08-12 LAB — URINALYSIS, ROUTINE W REFLEX MICROSCOPIC
BILIRUBIN URINE: NEGATIVE
Glucose, UA: NEGATIVE mg/dL
Hgb urine dipstick: NEGATIVE
Ketones, ur: NEGATIVE mg/dL
NITRITE: NEGATIVE
PH: 6.5 (ref 5.0–8.0)
Protein, ur: NEGATIVE mg/dL
SPECIFIC GRAVITY, URINE: 1.022 (ref 1.005–1.030)

## 2016-08-12 LAB — RAPID URINE DRUG SCREEN, HOSP PERFORMED
Amphetamines: NOT DETECTED
Barbiturates: NOT DETECTED
Benzodiazepines: NOT DETECTED
COCAINE: NOT DETECTED
OPIATES: NOT DETECTED
TETRAHYDROCANNABINOL: NOT DETECTED

## 2016-08-12 LAB — CBC
HCT: 38.6 % (ref 36.0–46.0)
HEMOGLOBIN: 13.1 g/dL (ref 12.0–15.0)
MCH: 29.6 pg (ref 26.0–34.0)
MCHC: 33.9 g/dL (ref 30.0–36.0)
MCV: 87.1 fL (ref 78.0–100.0)
PLATELETS: 253 10*3/uL (ref 150–400)
RBC: 4.43 MIL/uL (ref 3.87–5.11)
RDW: 13.3 % (ref 11.5–15.5)
WBC: 8.4 10*3/uL (ref 4.0–10.5)

## 2016-08-12 LAB — COMPREHENSIVE METABOLIC PANEL
ALT: 23 U/L (ref 14–54)
ANION GAP: 5 (ref 5–15)
AST: 19 U/L (ref 15–41)
Albumin: 4.7 g/dL (ref 3.5–5.0)
Alkaline Phosphatase: 96 U/L (ref 38–126)
BUN: 12 mg/dL (ref 6–20)
CALCIUM: 9.1 mg/dL (ref 8.9–10.3)
CHLORIDE: 107 mmol/L (ref 101–111)
CO2: 28 mmol/L (ref 22–32)
Creatinine, Ser: 0.69 mg/dL (ref 0.44–1.00)
Glucose, Bld: 104 mg/dL — ABNORMAL HIGH (ref 65–99)
Potassium: 3.7 mmol/L (ref 3.5–5.1)
SODIUM: 140 mmol/L (ref 135–145)
Total Bilirubin: 0.4 mg/dL (ref 0.3–1.2)
Total Protein: 7.9 g/dL (ref 6.5–8.1)

## 2016-08-12 LAB — URINE MICROSCOPIC-ADD ON

## 2016-08-12 LAB — PREGNANCY, URINE: Preg Test, Ur: NEGATIVE

## 2016-08-12 LAB — SALICYLATE LEVEL

## 2016-08-12 LAB — ETHANOL

## 2016-08-12 LAB — ACETAMINOPHEN LEVEL

## 2016-08-12 MED ORDER — SERTRALINE HCL 50 MG PO TABS
50.0000 mg | ORAL_TABLET | Freq: Every day | ORAL | Status: DC
  Administered 2016-08-13 – 2016-08-19 (×7): 50 mg via ORAL
  Filled 2016-08-12 (×7): qty 1
  Filled 2016-08-12: qty 7
  Filled 2016-08-12: qty 1

## 2016-08-12 MED ORDER — DIVALPROEX SODIUM ER 500 MG PO TB24: 500.0000 mg | ORAL_TABLET | Freq: Every day | ORAL | Status: DC

## 2016-08-12 MED ORDER — IBUPROFEN 400 MG PO TABS
400.0000 mg | ORAL_TABLET | Freq: Four times a day (QID) | ORAL | Status: DC | PRN
  Administered 2016-08-12 – 2016-08-16 (×4): 400 mg via ORAL
  Filled 2016-08-12 (×4): qty 1

## 2016-08-12 MED ORDER — TRAZODONE HCL 50 MG PO TABS
50.0000 mg | ORAL_TABLET | Freq: Every evening | ORAL | Status: DC | PRN
  Filled 2016-08-12: qty 1

## 2016-08-12 MED ORDER — FOSFOMYCIN TROMETHAMINE 3 G PO PACK: 3.0000 g | PACK | Freq: Once | ORAL | Status: DC

## 2016-08-12 MED ORDER — HYDROXYZINE HCL 25 MG PO TABS
25.0000 mg | ORAL_TABLET | Freq: Once | ORAL | Status: AC
  Administered 2016-08-12: 25 mg via ORAL
  Filled 2016-08-12: qty 1

## 2016-08-12 MED ORDER — SERTRALINE HCL 50 MG PO TABS
50.0000 mg | ORAL_TABLET | Freq: Every day | ORAL | Status: DC
  Administered 2016-08-12: 50 mg via ORAL
  Filled 2016-08-12: qty 1

## 2016-08-12 MED ORDER — GABAPENTIN 100 MG PO CAPS
100.0000 mg | ORAL_CAPSULE | Freq: Three times a day (TID) | ORAL | Status: DC
  Administered 2016-08-13 – 2016-08-14 (×5): 100 mg via ORAL
  Filled 2016-08-12 (×10): qty 1

## 2016-08-12 MED ORDER — QUETIAPINE FUMARATE 100 MG PO TABS: 100.0000 mg | ORAL_TABLET | Freq: Every day | ORAL | Status: DC

## 2016-08-12 MED ORDER — DIVALPROEX SODIUM ER 500 MG PO TB24
500.0000 mg | ORAL_TABLET | Freq: Every day | ORAL | Status: DC
  Administered 2016-08-12 – 2016-08-15 (×4): 500 mg via ORAL
  Filled 2016-08-12 (×7): qty 1

## 2016-08-12 MED ORDER — GABAPENTIN 100 MG PO CAPS
100.0000 mg | ORAL_CAPSULE | Freq: Three times a day (TID) | ORAL | Status: DC
  Administered 2016-08-12: 100 mg via ORAL
  Filled 2016-08-12: qty 1

## 2016-08-12 MED ORDER — ACETAMINOPHEN 325 MG PO TABS: 650.0000 mg | ORAL_TABLET | Freq: Four times a day (QID) | ORAL | Status: DC | PRN

## 2016-08-12 MED ORDER — QUETIAPINE FUMARATE 100 MG PO TABS
100.0000 mg | ORAL_TABLET | Freq: Every day | ORAL | Status: DC
  Administered 2016-08-12 – 2016-08-13 (×2): 100 mg via ORAL
  Filled 2016-08-12 (×5): qty 1

## 2016-08-12 MED ORDER — SULFAMETHOXAZOLE-TRIMETHOPRIM 800-160 MG PO TABS
1.0000 | ORAL_TABLET | Freq: Two times a day (BID) | ORAL | Status: DC
  Administered 2016-08-12: 1 via ORAL
  Filled 2016-08-12 (×2): qty 1

## 2016-08-12 NOTE — Progress Notes (Signed)
Patient ID: Caitlin RudVictoria L Bell, female   DOB: 06/22/1991, 25 y.o.   MRN: [id number] D: [facility], did not participate, but sat with peers. A: Writer introduced self to client encouraged her to report any concerns. Medications reviewed, administered as ordered. Ibuprofen 400 mg administered for complaints of back pack. Staff will monitor q6115min for safety. R: Client is safe on the unit.

## 2016-08-12 NOTE — Progress Notes (Signed)
Patient ID: Caitlin Bell, female   DOB: 01/29/1991, 25 y.o.   MRN: [id number] Re[facility] RN

## 2016-08-12 NOTE — BH Assessment (Addendum)
Assessment Note  Caitlin Bell is a 25 y.o. female who [facility]. Pt shares that she hasn't been on all of her medications for @ 2 months. She still had Depakote until about a week ago. The reason for her not taking her meds is that she believed her Medicaid was cancelled since she doesn't have her children anymore and she never rec'd a call for a f/u med mgmt appt from her psychiatrist at the time. Pt is currently homeless and didn't have the resources to follow up to verify. Pt also endorses hearing voices -some good voices (telling her she's going to be ok) and some bad voices (telling her they're coming to kill her). Pt also endorses seeing shadows walking alongside of the bed whenever she's trying to go to sleep. Pt was appropriate during the assessment and did not appear to be responding to internal stimuli or experiencing delusional thought content. Pt additionally endorsed SI with a plan to OD.   Diagnosis: MDD, recurrent episode, severe, w/ psychotic features  Past Medical History:  Past Medical History:  Diagnosis Date  . Anxiety   . Bipolar disorder (HCC)   . 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 Depression   . Gestational diabetes     Past Surgical History:  Procedure Laterality Date  . CHOLECYSTECTOMY  2014  . HERNIA REPAIR      Family History: History reviewed. No pertinent family history.  Social History:  reports that she quit smoking about 4 years ago. She has never used smokeless tobacco. She reports that she drinks about 2.4 oz of alcohol per week . She reports that she uses drugs, including Cocaine.  Additional Social History:  Alcohol / Drug Use Pain Medications: denies Prescriptions: denies Over the Counter: denies History of alcohol / drug use?: Yes (admits to occasional cocaine use-last use on 11/5th)  CIWA: CIWA-Ar BP: 122/75 Pulse Rate: 104 COWS:    Allergies:  Allergies  Allergen Reactions   . Onion Hives and Swelling  . Other Hives and Other (See Comments)    All types of peppers    Home Medications:  (Not in a hospital admission)  OB/GYN Status:  Patient's last menstrual period was 07/29/2016.  General Assessment Data [facility] of Assessment: [facility] TTS Assessment: In system Is this a Tele or Face-to-Face Assessment?: Face-to-Face Is this an Initial Assessment or a Re-assessment for this encounter?: Initial Assessment Marital status: Single Is patient pregnant?: No Pregnancy Status: No Living Arrangements: Other (Comment) (homeless) Can pt return to current living arrangement?: Yes Admission Status: Voluntary Is patient capable of signing voluntary admission?: Yes Referral Source: Self/Family/Friend     Crisis Care Plan Living Arrangements: Other (Comment) (homeless)  Education Status Is patient currently in school?: No  Risk to self with the past 6 months Suicidal Ideation: Yes-Currently Present Has patient been a risk to self within the past 6 months prior to admission? : No Suicidal Intent: Yes-Currently Present Has patient had any suicidal intent within the past 6 months prior to admission? : Yes Is patient at risk for suicide?: Yes Suicidal Plan?: Yes-Currently Present Has patient had any suicidal plan within the past 6 months prior to admission? : No Specify Current Suicidal Plan: get her hands on some Percocet and OD Access to Means: Yes Specify Access to Suicidal Means: can receive in the community What has been your use of drugs/alcohol within the last 12 months?: see above  Previous Attempts/Gestures: Yes How many times?: 2 Triggers for Past Attempts: Other (Comment) (losing custody of her children) Intentional Self Injurious Behavior: None Family Suicide History: Unknown Recent stressful life event(s): Other (Comment) (has to sign over her parental rights) Persecutory voices/beliefs?: Yes Depression: Yes Depression Symptoms:  Insomnia Substance abuse history and/or treatment for substance abuse?: Yes Suicide prevention information given to non-admitted patients: Not applicable  Risk to Others within the past 6 months Homicidal Ideation: No Does patient have any lifetime risk of violence toward others beyond the six months prior to admission? : No Thoughts of Harm to Others: No Current Homicidal Intent: No Current Homicidal Plan: No Access to Homicidal Means: No History of harm to others?: No Assessment of Violence: None Noted Does patient have access to weapons?: No Criminal Charges Pending?: No Does patient have a court date: No Is patient on probation?: No  Psychosis Hallucinations: Auditory, Visual Delusions: None noted  Mental Status Report Appearance/Hygiene: Unremarkable Eye Contact: Good Motor Activity: Unremarkable Speech: Logical/coherent Level of Consciousness: Alert Mood: Pleasant Affect: Appropriate to circumstance Anxiety Level: None Thought Processes: Coherent, Relevant Judgement: Impaired Orientation: Person, Place, Time, Situation, Appropriate for developmental age Obsessive Compulsive Thoughts/Behaviors: None  Cognitive Functioning Concentration: Normal Memory: Remote Intact, Recent Intact IQ: Average Insight: see judgement above Impulse Control: Fair Appetite: Poor Sleep: Decreased Total Hours of Sleep: 1 Vegetative Symptoms: None  ADLScreening [facility] Assessment Services) Patient's cognitive ability adequate to safely complete daily activities?: Yes Patient able to express need for assistance with ADLs?: Yes Independently performs ADLs?: Yes (appropriate for developmental age)  Prior Inpatient Therapy Prior Inpatient Therapy: Yes Prior Therapy Dates: several admissions Prior Therapy Facilty/Provider(s): [facility] Reason for Treatment: SI; AVH  Prior Outpatient Therapy Prior Outpatient Therapy: Yes Prior Therapy Dates: 2017 Prior Therapy Facilty/Provider(s): [facility] Reason for Treatment: SI; AVH Does patient have an ACCT team?: No Does patient have Intensive In-House Services?  : No Does patient have [facility] services? : No Does patient have P4CC services?: No  ADL Screening (condition at time of admission) Patient's cognitive ability adequate to safely complete daily activities?: Yes Is the patient deaf or have difficulty hearing?: No Does the patient have difficulty seeing, even when wearing glasses/contacts?: No Does the patient have difficulty concentrating, remembering, or making decisions?: No Patient able to express need for assistance with ADLs?: Yes Does the patient have difficulty dressing or bathing?: No Independently performs ADLs?: Yes (appropriate for developmental age) Does the patient have difficulty walking or climbing stairs?: No Weakness of Legs: None Weakness of Arms/Hands: None  Home Assistive Devices/Equipment Home Assistive Devices/Equipment: None  Therapy Consults (therapy consults require a physician order) PT Evaluation Needed: No OT Evalulation Needed: No SLP Evaluation Needed: No Abuse/Neglect Assessment (Assessment to be complete while patient is alone) Physical Abuse: Denies Verbal Abuse: Denies Sexual Abuse: Denies Exploitation of patient/patient's resources: Denies Self-Neglect: Denies Values / Beliefs Cultural Requests During Hospitalization: None Spiritual Requests During Hospitalization: None Consults Spiritual Care Consult Needed: No Social Work Consult Needed: No Merchant navy officer (For Healthcare) Does patient have an advance directive?: No Would patient like information on creating an advanced directive?: No - patient declined information Nutrition Screen- [facility] Patient's home diet: Regular  Additional Information 1:1 In Past 12 Months?: No CIRT Risk: No Elopement Risk: No Does patient have medical clearance?: Yes     Disposition:  Disposition Initial Assessment Completed  for this Encounter: Yes (consulted with Alberteen Sam, NP) Disposition of Patient: Inpatient treatment program Type of inpatient treatment  program: Adult  On Site Evaluation by:   Reviewed with Physician:    Laddie AquasSamantha M Lashana Spang 08/12/2016 3:24 PM

## 2016-08-12 NOTE — Tx Team (Signed)
Initial Treatment Plan 08/12/2016 8:46 PM Margrett RudVictoria L Bai WGN:[id number]    PATIENT STRESSORS: Financial difficulties Medication change or noncompliance   PATIENT STRENGTHS: Ability for insight Active sense of humor Communication skills General fund of knowledge Motivation for treatment/growth   PATIENT IDENTIFIED PROBLEMS: Depression Suicidal thoughts Paranoia Auditory hallucinations "Getting back on my medicine"                     DISCHARGE CRITERIA:  Ability to meet basic life and health needs Improved stabilization in mood, thinking, and/or behavior Verbal commitment to aftercare and medication compliance  PRELIMINARY DISCHARGE PLAN: Attend aftercare/continuing care group Return to previous living arrangement  PATIENT/FAMILY INVOLVEMENT: This treatment plan has been presented to and reviewed with the patient, Margrett RudVictoria L Colla, and/or family member, .  The patient and family have been given the opportunity to ask questions and make suggestions.  Aleksey Newbern, [place], [place] 08/12/2016, 8:46 PM

## 2016-08-12 NOTE — ED Triage Notes (Addendum)
Pt reports feeling sad, depressed, states she feels her "Bipolar with Psychosis is flaring up" due to being without medications for 2 months due to losing medicaid. Pt states "I don't trust myself anymore, I feel like I would hurt myself, I would overdose on pills." Reports nighttime visual hallucinations and paranoia, reports seeing "shadows" that tell her they will "kill her." Pt voluntary at this time. Pt placed in burgundy scrubs and wanded by security. Pt reports her kids where taken away a year ago and that today is her son's third birthday and she is very upset. She does report 25 lb weight loss over the past 2-3 months, reports using Cocaine and ETOH one week ago. Associated insomnia. Pt reports being homeless for one month and living in a car.

## 2016-08-12 NOTE — Progress Notes (Signed)
Caitlin Bell is a 25 year old female being admitted voluntarily [facility] from [facility].  She presented to the ED with depression and feeling like her Bipolar symptoms are getting worse.  She hasn't taken medications in over 2 months because her Medicaid was stopped.  She stated that she would take an OD on pills.  She reported that she is having visual hallucinations and paranoia at night.  She feels that the shadows that she sees are trying to hurt her.  Her children were taken away from her 1 year ago and today is her son's third birthday.  She reported using alcohol and cocaine one week ago.  She is reporting that she is homeless.  She continues to voice passive suicidal ideation but is able to contract for safety on the unit.  Oriented her to the unit.  Admission paperwork completed and signed.  Belongings searched and secured in locker # 28.  Skin assessment completed and no skin issues noted.  Q 15 minute checks initiated for safety.  We will monitor the progress towards her goals.

## 2016-08-12 NOTE — BH Assessment (Signed)
[facility] Assessment Progress Note  Per Alberteen SamFran Hobson, FNP, this pt requires psychiatric hospitalization at this time.  Lillia AbedLindsay, RN, [facility]; they will be ready to receive her after 19:30, and once she is medically cleared.  Pt has signed Voluntary Admission and Consent for Treatment, as well as Consent to Release Information to no one, and signed forms [facility].  Pt's nurse has been notified, and agrees to send original paperwork along with pt via [place], and to call report to [phone].  Doylene Canninghomas Lux Skilton, MA Triage Specialist [phone]

## 2016-08-12 NOTE — ED Provider Notes (Signed)
[facility]ENC[facility] DEPT Provider Note   CSN: [id number] Arrival date & time: 08/12/16  1208     History   Chief Complaint Chief Complaint  Patient presents with  . Suicidal    HPI Caitlin Bell is a 25 y.o. female with a past medical history significant for bipolar disorder, cervical palsy, depression, prior suicide attempts, and prior self-harm who presents with suicidal ideation, hallucinations, depression, and paranoia. Patient reports that she was admitted several months ago for depression and suicidal ideation and was started on medications. She reports that months ago, she lost her Medicaid and has been unable to get her medications. She says that the only medicine she has taken recently was Depakote last week. She says that with her not taking medications, she has had return of her suicidal ideation. She says she is having thoughts of overdose which is how she attempted suicide in the past. She also says that she has had return of auditory hallucinations. She says that there are some "friendly voices" that there are also 2 voices that continued to tell her "to kill myself". She says that the sources are getting louder and she is concerned she might try again.  She denies any homicidal ideation. She does report that she used cocaine on Sunday but has not done so since. She says she used the cocaine to "calm down," she says this worked.  She denies any fevers, chills, chest pain, shortness of breath, nausea, vomiting, constipation, diarrhea. She does report some mild dysuria for the last week.     The history is provided by the patient and medical records. No language interpreter was used.  Mental Health Problem  Presenting symptoms: depression, hallucinations and suicidal thoughts   Presenting symptoms: no aggressive behavior, no agitation, no suicidal threats and no suicide attempt   Degree of incapacity (severity):  Severe Onset quality:  Gradual Duration:  1 week Timing:   Constant Progression:  Worsening Chronicity:  Recurrent Context: drug abuse and stressful life event   Treatment compliance:  Untreated Relieved by:  Nothing Worsened by:  Nothing Ineffective treatments:  None tried Associated symptoms: anxiety, feelings of worthlessness and insomnia   Associated symptoms: no abdominal pain, no chest pain, no fatigue and no headaches   Risk factors: hx of mental illness and hx of suicide attempts     Past Medical History:  Diagnosis Date  . Anxiety   . Bipolar disorder (HCC)   . 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 Depression   . Gestational diabetes     Patient Active Problem List   Diagnosis Date Noted  . Bipolar I disorder, most recent episode depressed, severe w psychosis (HCC) 05/13/2016  . Affective psychosis, bipolar (HCC)   . Bipolar affective disorder, depressed, severe (HCC) 03/01/2016  . Bipolar affective disorder, current episode severe (HCC) 03/01/2016  . Bipolar affective disorder, depressed, severe, with psychotic behavior (HCC) 02/02/2016  . Normal labor 10/31/2014  . Bipolar disorder (HCC) 10/31/2014  . Cholestasis of pregnancy 10/31/2014  . H/O anorexia nervosa 10/31/2014  . H/O bulimia nervosa 10/31/2014  . Gestational diabetes 10/31/2014  . Depression--on Kasandra Knudsen 10/31/2014  . Umbilical hernia 10/31/2014  . Hx of preterm delivery  10/31/2014  . Preterm delivery 10/31/2014  . Generalized anxiety disorder 10/31/2014  . History of marijuana use 10/31/2014  . Cerebral palsy (HCC) 10/31/2014  . [redacted] weeks gestation of pregnancy   . Hx of PTL (preterm labor), current pregnancy 10/30/2014  . Preterm labor  10/30/2014  . Back pain complicating pregnancy in third trimester 10/14/2014    Past Surgical History:  Procedure Laterality Date  . CHOLECYSTECTOMY  2014  . HERNIA REPAIR      OB History    Gravida Para Term Preterm AB Living   4 3 1 2  0 3   SAB TAB Ectopic Multiple Live Births   0 0 0 0 3        Home Medications    Prior to Admission medications   Medication Sig Start Date End Date Taking? Authorizing Provider  acetaminophen (TYLENOL) 500 MG tablet Take 1,000 mg by mouth every 6 (six) hours as needed for mild pain or headache.   Yes Historical Provider, MD  clonazePAM (KLONOPIN) 0.5 MG tablet Take 0.5 mg by mouth 2 (two) times daily as needed for anxiety.   Yes Historical Provider, MD  divalproex (DEPAKOTE ER) 500 MG 24 hr tablet Take 1 tablet (500 mg total) by mouth at bedtime. For mood stabilization 05/21/16  Yes Adonis BrookSheila Agustin, NP  gabapentin (NEURONTIN) 300 MG capsule Take 2 capsules (600 mg total) by mouth 3 (three) times daily. Anxiety control 05/21/16  Yes Adonis BrookSheila Agustin, NP  QUEtiapine (SEROQUEL) 200 MG tablet Take 1 tablet (200 mg total) by mouth at bedtime. MOOD CONTROL AND INSOMNIA 05/21/16  Yes Adonis BrookSheila Agustin, NP  sertraline (ZOLOFT) 50 MG tablet Take 1 tablet (50 mg total) by mouth daily. depression 05/21/16  Yes Adonis BrookSheila Agustin, NP    Family History History reviewed. No pertinent family history.  Social History Social History  Substance Use Topics  . Smoking status: Former Smoker    Quit date: 10/31/2011  . Smokeless tobacco: Never Used  . Alcohol use 2.4 oz/week    4 Shots of liquor per week     Comment: 2 days; weekly use     Allergies   Onion and Other   Review of Systems Review of Systems  Constitutional: Negative for activity change, chills, diaphoresis, fatigue and fever.  HENT: Negative for congestion and rhinorrhea.   Eyes: Negative for visual disturbance.  Respiratory: Negative for cough, chest tightness, shortness of breath and stridor.   Cardiovascular: Negative for chest pain, palpitations and leg swelling.  Gastrointestinal: Negative for abdominal distention, abdominal pain, constipation, diarrhea, nausea and vomiting.  Genitourinary: Positive for dysuria. Negative for difficulty urinating, flank pain, frequency, hematuria, menstrual  problem, pelvic pain, vaginal bleeding and vaginal discharge.  Musculoskeletal: Negative for back pain and neck pain.  Skin: Negative for rash and wound.  Neurological: Negative for dizziness, weakness, light-headedness, numbness and headaches.  Psychiatric/Behavioral: Positive for hallucinations and suicidal ideas. Negative for agitation and confusion. The patient is nervous/anxious and has insomnia.   All other systems reviewed and are negative.    Physical Exam Updated Vital Signs BP 122/75 (BP Location: Right Arm)   Pulse 104   Temp 98.1 F (36.7 C) (Oral)   Resp 15   Ht 5\' 4"  (1.626 m)   Wt 100 lb (45.4 kg)   LMP 07/29/2016   SpO2 95%   BMI 17.16 kg/m   Physical Exam  Constitutional: She is oriented to person, place, and time. She appears well-developed and well-nourished. No distress.  HENT:  Head: Normocephalic and atraumatic.  Mouth/Throat: Oropharynx is clear and moist. No oropharyngeal exudate.  Eyes: Conjunctivae and EOM are normal. Pupils are equal, round, and reactive to light.  Neck: Normal range of motion. Neck supple.  Cardiovascular: Normal rate and regular rhythm.   No murmur heard.  Pulmonary/Chest: Effort normal and breath sounds normal. No respiratory distress.  Abdominal: Soft. There is no tenderness.  Musculoskeletal: She exhibits no edema.  Neurological: She is alert and oriented to person, place, and time. She displays normal reflexes. No cranial nerve deficit or sensory deficit. She exhibits normal muscle tone. Coordination normal.  Skin: Skin is warm and dry. Capillary refill takes less than 2 seconds. No erythema.  Psychiatric: She is actively hallucinating. She is not agitated. Thought content is paranoid. She exhibits a depressed mood. She expresses suicidal ideation. She expresses no homicidal ideation. She expresses suicidal plans. She expresses no homicidal plans.  Nursing note and vitals reviewed.    ED Treatments / Results  Labs (all labs  ordered are listed, but only abnormal results are displayed) Labs Reviewed  COMPREHENSIVE METABOLIC PANEL - Abnormal; Notable for the following:       Result Value   Glucose, Bld 104 (*)    All other components within normal limits  ACETAMINOPHEN LEVEL - Abnormal; Notable for the following:    Acetaminophen (Tylenol), Serum <10 (*)    All other components within normal limits  URINALYSIS, ROUTINE W REFLEX MICROSCOPIC (NOT AT [facility]) - Abnormal; Notable for the following:    APPearance CLOUDY (*)    Leukocytes, UA MODERATE (*)    All other components within normal limits  URINE MICROSCOPIC-ADD ON - Abnormal; Notable for the following:    Squamous Epithelial / LPF 6-30 (*)    Bacteria, UA FEW (*)    All other components within normal limits  ETHANOL  SALICYLATE LEVEL  CBC  RAPID URINE DRUG SCREEN, HOSP PERFORMED  PREGNANCY, URINE    EKG  EKG Interpretation None       Radiology No results found.  Procedures Procedures (including critical care time)  Medications Ordered in ED Medications  hydrOXYzine (ATARAX/VISTARIL) tablet 25 mg (25 mg Oral Given 08/12/16 1610)     Initial Impression / Assessment and Plan / ED Course  I have reviewed the triage vital signs and the nursing notes.  Pertinent labs & imaging results that were available during my care of the patient were reviewed by me and considered in my medical decision making (see chart for details).  Clinical Course     Caitlin Bell is a 25 y.o. female with a past medical history significant for bipolar disorder, cervical palsy, depression, prior suicide attempts, and prior self-harm who presents with suicidal ideation, hallucinations, depression, and paranoia.  History and exam are seen above.   Physical exam completely unremarkable. Patient has clear lungs, abdomen nontender. Nonfocal neurologic exam.  Due to patient's report of dysuria, urinalysis will be sent. Patient will be medically cleared for  psychiatry to evaluate her after urinalysis.  Urinalysis showed evidence of UTI. One dose of fosfomycin ordered. Pharmacy reviewed this and felt Bactrim was better for UTI treatment. Patient has Bactrim ordered to have one tablet every 12 hours for 3 days.  Anticipate following up on psychiatric recommendations.   Final Clinical Impressions(s) / ED Diagnoses   Final diagnoses:  Suicidal ideation  Acute cystitis without hematuria       Heide Scaleshristopher J Aritzel Krusemark, MD 08/12/16 2108

## 2016-08-13 DIAGNOSIS — Z91018 Allergy to other foods: Secondary | ICD-10-CM

## 2016-08-13 DIAGNOSIS — F3130 Bipolar disorder, current episode depressed, mild or moderate severity, unspecified: Secondary | ICD-10-CM

## 2016-08-13 MED ORDER — LORAZEPAM 0.5 MG PO TABS
0.5000 mg | ORAL_TABLET | Freq: Four times a day (QID) | ORAL | Status: DC | PRN
  Administered 2016-08-13 – 2016-08-19 (×6): 0.5 mg via ORAL
  Filled 2016-08-13 (×7): qty 1

## 2016-08-13 NOTE — Progress Notes (Signed)
D.  Pt pleasant but anxious on approach, denies complaints at this time.  Pt was positive for evening wrap up group, observed interacting appropriately with peers on the unit.  Pt denies HI/hallucinations at this time, does still continue to endorse passive SI at times.  A.  Support and encouragement offered, medication given as ordered  R.  Pt remains safe on the unit, will continue to monitor.

## 2016-08-13 NOTE — BHH Counselor (Signed)
Adult Comprehensive Assessment  Patient ZO:XWRUEAVW:Caitlin Bell, female DOB:07/01/1991, 25 y.o.UJW:[id number]  Information Source: Information source: Patient  Current Stressors:  Educational / Learning stressors: Denies stressors Employment / Job issues: Unemployed; on SSI Family Relationships: Does not get along well with mother; is not able to see her children except during 2hr supervised visits on a weekly basis Financial / Lack of resources (include bankruptcy): None reported Housing / Lack of housing: Pt has been homeless and sleeping in a friend's car for 2 weeks Physical health (include injuries & life threatening diseases): Denies stressors Social relationships: Denies stressors Substance abuse: has been using cocaine  Bereavement / Loss: Denies stressors  Living/Environment/Situation:  Living Arrangements: Homeless-staying in a friend's car Living conditions (as described by patient or guardian): transient and unstable How long has patient lived in current situation?: 2 weeks What is atmosphere in current home: Chaotic  Family History:  Marital status: Single Are you sexually active?: Yes What is your sexual orientation?: Straight Has your sexual activity been affected by drugs, alcohol, medication, or emotional stress?: None Does patient have children?: Yes How many children?: 4 How is patient's relationship with their children?: 8yo, 3yo, 2yo, and 1yo - relationship is good. Mother has custody; Pt sees children on Thursdays  Childhood History:  By whom was/is the patient raised?: Mother Additional childhood history information: No contact with father Description of patient's relationship with caregiver when they were a child: Good relationship with mother as a child Patient's description of current relationship with people who raised him/her: Pt's 8yo daughter lives with mother, so that puts a strain on their relationship. Pt just got her younger 3  children back from mother about a month ago. How were you disciplined when you got in trouble as a child/adolescent?: Spanked, spanked Does patient have siblings?: Yes Number of Siblings: 1 Description of patient's current relationship with siblings: 1 half-sister, very good relationship Did patient suffer any verbal/emotional/physical/sexual abuse as a child?: No Did patient suffer from severe childhood neglect?: No Has patient ever been sexually abused/assaulted/raped as an adolescent or adult?: No Was the patient ever a victim of a crime or a disaster?: No Witnessed domestic violence?: No Has patient been effected by domestic violence as an adult?: No  Education:  Highest grade of school patient has completed: 12th grade Currently a student?: No Learning disability?: No  Employment/Work Situation:  Employment situation: Unemployed What is the longest time patient has a held a job?: 3 months 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 Has patient ever been in the Eli Lilly and Companymilitary?: No Are There Guns or Other Weapons in Your Home?: No  Financial Resources:  Surveyor, quantityinancial resources: [employer]icaid, Food stamps Does patient have a Lawyerrepresentative payee or guardian?: No  Alcohol/Substance Abuse:  What has been your use of drugs/alcohol within the last 12 months?: intermittent cocaine use Alcohol/Substance Abuse Treatment Hx: Denies past history Has alcohol/substance abuse ever caused legal problems?: No  Social Support System:  Conservation officer, natureatient's Community Support System: Fair Museum/gallery exhibitions officerDescribe Community Support System: Mom, half-sister Type of faith/religion: None  Leisure/Recreation:  Leisure and Hobbies: Higher education careers adviserWatches TV, reads books  Strengths/Needs:  What things does the patient do well?: Likes to organize In what areas does patient struggle / problems for patient: Mental health, Auditory/visual hallucinations, depression, anxiety  Discharge Plan:  Does patient have  access to transportation?: Yes- intermittently Will patient be returning to same living situation after discharge?: No, Pt is unsure where she will live at discharge as she recently lost  her housing Currently receiving [facility] but cannot return due to not having Medicaid If no, would patient like referral for services when discharged?: Yes, to the community MH clinic in the county where she is discharged to Does patient have financial barriers related to discharge medications?:Yes, losing her Medicaid  Summary/Recommendations:  Patient is a 25 year old female with a diagnosis of Bipolar Disorder. Pt presented to the hospital with thoughts of suicide, auditory hallucinations, and increased depression. Pt reports primary trigger(s) for admission include becoming homeless and not having access to care or medications. Patient will benefit from crisis stabilization, medication evaluation, group therapy and psycho education in addition to case management for discharge planning. At discharge it is recommended that Pt remain compliant with established discharge plan and continued treatment.   Vernie ShanksLauren Nathen Balaban, LCSW Clinical Social Work [phone]

## 2016-08-13 NOTE — Progress Notes (Signed)
Recreation Therapy Notes  Date: 08/13/16 Time: 0930 Location: [address] Dayroom  Group Topic: Stress Management  Goal Area(s) Addresses:  Patient will verbalize importance of using healthy stress management.  Patient will identify positive emotions associated with healthy stress management.   Intervention: Stress Management  Activity :  Progressive Muscle Relaxation.  LRT introduced the stress management technique of progressive muscle relaxation.  LRT read a script to guide patients through the technique.  Patients were to follow along as LRT read script.  Education:  Stress Management, Discharge Planning.   Education Outcome: Acknowledges edcuation/In group clarification offered/Needs additional education  Clinical Observations/Feedback: Pt did not attend  group.    Caroll RancherMarjette Daveah Varone, LRT/CTRS       Caroll RancherLindsay, Melisha Eggleton A 08/13/2016 11:34 AM

## 2016-08-13 NOTE — [facility] Suicide Risk Assessment (Signed)
[facility]   Nursing information obtained from:   patient and chart  Demographic factors:   single, has 4 children, currently homeless, patient's mother has custody of children, unemployed  Current Mental Status:   as below  Loss Factors:   unemployment, lost medicaid benefits, homelessness, not having custody of her children Historical Factors:   history of mood disorder, has been diagnosed with Bipolar Disorder, history of Cannabis and Cocaine Abuse  Risk Reduction Factors:   sense of responsibility to family   Total Time spent with patient: 45 minutes Principal Problem:  Bipolar Disorder, Depressed, Cocaine Abuse , Cannabis Abuse  Diagnosis:   Patient Active Problem List   Diagnosis Date Noted  . MDD (major depressive disorder), recurrent severe, without psychosis (HCC) [F33.2] 08/12/2016  . Bipolar I disorder, most recent episode depressed, severe w psychosis (HCC) [F31.5] 05/13/2016  . Affective psychosis, bipolar (HCC) [F31.9]   . Bipolar affective disorder, depressed, severe (HCC) [F31.4] 03/01/2016  . Bipolar affective disorder, current episode severe (HCC) [F31.9] 03/01/2016  . Bipolar affective disorder, depressed, severe, with psychotic behavior (HCC) [F31.5] 02/02/2016  . Normal labor [O80, Z37.9] 10/31/2014  . Bipolar disorder (HCC) [F31.9] 10/31/2014  . Cholestasis of pregnancy [O26.619, K83.1] 10/31/2014  . H/O anorexia nervosa [Z86.59] 10/31/2014  . H/O bulimia nervosa [Z86.59] 10/31/2014  . Gestational diabetes [O24.419] 10/31/2014  . Depression--on Latuda [F32.9] 10/31/2014  . Umbilical hernia [K42.9] 10/31/2014  . Hx of preterm delivery  [O09.219] 10/31/2014  . Preterm delivery [O60.10X0] 10/31/2014  . Generalized anxiety disorder [F41.1] 10/31/2014  . History of marijuana use [Z87.898] 10/31/2014  . Cerebral palsy (HCC) [G80.9] 10/31/2014  . [redacted] weeks gestation of pregnancy [Z3A.35]   . Hx of PTL (preterm labor), current pregnancy  [O09.219] 10/30/2014  . Preterm labor [O60.00] 10/30/2014  . Back pain complicating pregnancy in third trimester [O26.893, M54.9] 10/14/2014    Continued Clinical Symptoms:  Alcohol Use Disorder Identification Test Final Score (AUDIT): 0 The "Alcohol Use Disorders Identification Test", Guidelines for Use in Primary Care, Second Edition.  World Science writerHealth Organization [facility](WHO). Score between 0-7:  no or low risk or alcohol related problems. Score between 8-15:  moderate risk of alcohol related problems. Score between 16-19:  high risk of alcohol related problems. Score 20 or above:  warrants further diagnostic evaluation for alcohol dependence and treatment.   CLINICAL FACTORS:  25 year old single female, known to our unit from prior admissions , has been diagnosed with Bipolar Disorder in the past . Presents with exacerbation of depression , with psychotic symptoms, in the context of losing her medicaid benefits and being unable to continue to afford her medications, homelessness, and cocaine abuse .     Psychiatric Specialty Exam: Physical Exam  ROS  Blood pressure (!) 87/68, pulse (!) 114, temperature 98.7 F (37.1 C), temperature source Oral, resp. rate 18, height 5\' 4"  (1.626 m), weight 122 lb (55.3 kg), last menstrual period 07/29/2016, not currently breastfeeding.Body mass index is 20.94 kg/m.   See admit note MSE    COGNITIVE FEATURES THAT CONTRIBUTE TO RISK:  Closed-mindedness and Loss of executive function    SUICIDE RISK:   Moderate:  Frequent suicidal ideation with limited intensity, and duration, some specificity in terms of plans, no associated intent, good self-control, limited dysphoria/symptomatology, some risk factors present, and identifiable protective factors, including available and accessible social support.   PLAN OF CARE: Patient will be admitted to inpatient psychiatric unit for stabilization and safety. Will provide and  encourage milieu participation. Provide  medication management and maked adjustments as needed.  Will follow daily.    I certify that inpatient services furnished can reasonably be expected to improve the patient's condition.  Nehemiah MassedOBOS, Callen Vancuren, MD 08/13/2016, 2:34 PM

## 2016-08-13 NOTE — H&P (Addendum)
Psychiatric Admission Assessment Adult  Patient Identification: Caitlin Bell MRN:  [id number] Date of Evaluation:  08/13/2016 Chief Complaint:   " I couldn't take my medications, and I got worse, very depressed" Principal Diagnosis:  Bipolar Disorder, Depressed  Diagnosis:   Patient Active Problem List   Diagnosis Date Noted  . MDD (major depressive disorder), recurrent severe, without psychosis ([place]) [F33.2] 08/12/2016  . Bipolar I disorder, most recent episode depressed, severe w psychosis ([place]) [F31.5] 05/13/2016  . Affective psychosis, bipolar ([place]) [F31.9]   . Bipolar affective disorder, depressed, severe ([place]) [F31.4] 03/01/2016  . Bipolar affective disorder, current episode severe ([place]) [F31.9] 03/01/2016  . Bipolar affective disorder, depressed, severe, with psychotic behavior ([place]) [F31.5] 02/02/2016  . Normal labor [O80, Z37.9] 10/31/2014  . Bipolar disorder ([place]) [F31.9] 10/31/2014  . Cholestasis of pregnancy [O26.619, K83.1] 10/31/2014  . H/O anorexia nervosa [Z86.59] 10/31/2014  . H/O bulimia nervosa [Z86.59] 10/31/2014  . Gestational diabetes [O24.419] 10/31/2014  . Depression--on Latuda [F32.9] 10/31/2014  . Umbilical hernia [Y80.1] 10/31/2014  . Hx of preterm delivery  [O09.219] 10/31/2014  . Preterm delivery [O60.10X0] 10/31/2014  . Generalized anxiety disorder [F41.1] 10/31/2014  . History of marijuana use [Z87.898] 10/31/2014  . Cerebral palsy ([place]) [G80.9] 10/31/2014  . [redacted] weeks gestation of pregnancy [Z3A.35]   . Hx of PTL (preterm labor), current pregnancy [O09.219] 10/30/2014  . Preterm labor [O60.00] 10/30/2014  . Back pain complicating pregnancy in third trimester [O26.893, M54.9] 10/14/2014   History of Present Illness: 25 year old single female, currently homeless, most recently living in a friend's car.. Patient is well known to our unit from prior admissions . She states that she lost her medicaid recently due to which she was unable to afford  her medications. States that in the context of being off medications she has been feeling worse, more depressed, " like I am there but not there", " very depressed". Describes neuro-vegetative symptoms of depression as below. Reports auditory hallucinations, hears voice telling her " they are coming to kill you", and is having fleeting visual hallucinations - sees " shadows".  Reports recent recurrence of self cutting , which she has a history of in the past ." it gets my mind on something else " Of note, she has been abusing cocaine recently " maybe once a week, it's when I have the money". Last used 4-5 days ago.  Associated Signs/Symptoms: Depression Symptoms:  depressed mood, anhedonia, insomnia, recurrent thoughts of death, suicidal thoughts without plan, loss of energy/fatigue, decreased appetite,  Reports she has lost about 15 lbs over recent weeks  (Hypo) Manic Symptoms:   None at this time Anxiety Symptoms:  Describes frequent panic attacks  Psychotic Symptoms:  Describes auditory and brief visual hallucinations as above  PTSD Symptoms: Does not endorse  Total Time spent with patient: 45 minutes  Past Psychiatric History: History of multiple psychiatric admissions, starting at age 39. Most recent psychiatric admission was in August /17 for depression . History of Bipolar Disorder diagnosis- states she mostly has had depressive episodes , but does describe some episodes suggestive of hypomania . History of self cutting . No history of suicide attempts . History of frequent  panic attacks and of free floating anxiety , endorses agoraphobia. Denies history of violence   Is the patient at risk to self? Yes.    Has the patient been a risk to self in the past 6 months? Yes.    Has the patient been a risk to self  within the distant past? Yes.    Is the patient a risk to others? No.  Has the patient been a risk to others in the past 6 months? No.  Has the patient been a risk to others  within the distant past? No.   Prior Inpatient Therapy:  as above, Prior Outpatient Therapy:   Was following up at [facility] in [place], but states was unable to return there " because I lost my medicaid"   Alcohol Screening: 1. How often do you have a drink containing alcohol?: Never 2. How many drinks containing alcohol do you have on a typical day when you are drinking?: 1 or 2 3. How often do you have six or more drinks on one occasion?: Never Preliminary Score: 0 9. Have you or someone else been injured as a result of your drinking?: No 10. Has a relative or friend or a doctor or another health worker been concerned about your drinking or suggested you cut down?: No Alcohol Use Disorder Identification Test Final Score (AUDIT): 0 Brief Intervention: AUDIT score less than 7 or less-screening does not suggest unhealthy drinking-brief intervention not indicated Substance Abuse History in the last 12 months:  Reports Cocaine Abuse, as above .Denies alcohol or drug abuse at this time. Has been drinking " one drink a day", history of Cannabis Abuse, but has not used recently .  Consequences of Substance Abuse: Denies  Previous Psychotropic Medications: was on Depakote ER, Zoloft, Seroquel, Zoloft, Neurontin in the past, states this medication combination worked well for her , but was stopped because she lost her medicaid . Psychological Evaluations: no  Past Medical History:  Past Medical History:  Diagnosis Date  . Anxiety   . Bipolar disorder ([place])   . 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 Depression   . Gestational diabetes     Past Surgical History:  Procedure Laterality Date  . CHOLECYSTECTOMY  2014  . HERNIA REPAIR     Family History: parents alive, separated, has two siblings,. Family Psychiatric  History: Sister has been diagnosed with Bipolar Disorder, and has an uncle who is alcoholic, no suicides in family  Tobacco Screening: Have you used any form of  tobacco in the last 30 days? (Cigarettes, Smokeless Tobacco, Cigars, and/or Pipes): No Social History: patient is single, has 4 children, currently patient's mother has custody of her children, patient is currently homeless  History  Alcohol Use  . 2.4 oz/week  . 4 Shots of liquor per week    Comment: 2 days; weekly use     History  Drug Use  . Types: Cocaine    Comment: cocaine Sunday    Additional Social History:  Allergies:   Allergies  Allergen Reactions  . Onion Hives and Swelling  . Other Hives and Other (See Comments)    All types of peppers   Lab Results:  Results for orders placed or performed during the hospital encounter of 08/12/16 (from the past 48 hour(s))  Rapid urine drug screen (hospital performed)     Status: None   Collection Time: 08/12/16 12:55 PM  Result Value Ref Range   Opiates NONE DETECTED NONE DETECTED   Cocaine NONE DETECTED NONE DETECTED   Benzodiazepines NONE DETECTED NONE DETECTED   Amphetamines NONE DETECTED NONE DETECTED   Tetrahydrocannabinol NONE DETECTED NONE DETECTED   Barbiturates NONE DETECTED NONE DETECTED    Comment:        DRUG SCREEN FOR MEDICAL PURPOSES ONLY.  IF CONFIRMATION IS NEEDED FOR ANY PURPOSE, NOTIFY LAB WITHIN 5 DAYS.        LOWEST DETECTABLE LIMITS FOR URINE DRUG SCREEN Drug Class       Cutoff (ng/mL) Amphetamine      1000 Barbiturate      200 Benzodiazepine   967 Tricyclics       591 Opiates          300 Cocaine          300 THC              50   Pregnancy, urine     Status: None   Collection Time: 08/12/16 12:55 PM  Result Value Ref Range   Preg Test, Ur NEGATIVE NEGATIVE    Comment:        THE SENSITIVITY OF THIS METHODOLOGY IS >20 mIU/mL.   Urinalysis, Routine w reflex microscopic (not at [facility])     Status: Abnormal   Collection Time: 08/12/16  1:00 PM  Result Value Ref Range   Color, Urine YELLOW YELLOW   APPearance CLOUDY (A) CLEAR   Specific Gravity, Urine 1.022 1.005 - 1.030   pH 6.5 5.0 -  8.0   Glucose, UA NEGATIVE NEGATIVE mg/dL   Hgb urine dipstick NEGATIVE NEGATIVE   Bilirubin Urine NEGATIVE NEGATIVE   Ketones, ur NEGATIVE NEGATIVE mg/dL   Protein, ur NEGATIVE NEGATIVE mg/dL   Nitrite NEGATIVE NEGATIVE   Leukocytes, UA MODERATE (A) NEGATIVE  Urine microscopic-add on     Status: Abnormal   Collection Time: 08/12/16  1:00 PM  Result Value Ref Range   Squamous Epithelial / LPF 6-30 (A) NONE SEEN   WBC, UA 6-30 0 - 5 WBC/hpf   RBC / HPF 0-5 0 - 5 RBC/hpf   Bacteria, UA FEW (A) NONE SEEN  Comprehensive metabolic panel     Status: Abnormal   Collection Time: 08/12/16  1:15 PM  Result Value Ref Range   Sodium 140 135 - 145 mmol/L   Potassium 3.7 3.5 - 5.1 mmol/L   Chloride 107 101 - 111 mmol/L   CO2 28 22 - 32 mmol/L   Glucose, Bld 104 (H) 65 - 99 mg/dL   BUN 12 6 - 20 mg/dL   Creatinine, Ser 0.69 0.44 - 1.00 mg/dL   Calcium 9.1 8.9 - 10.3 mg/dL   Total Protein 7.9 6.5 - 8.1 g/dL   Albumin 4.7 3.5 - 5.0 g/dL   AST 19 15 - 41 U/L   ALT 23 14 - 54 U/L   Alkaline Phosphatase 96 38 - 126 U/L   Total Bilirubin 0.4 0.3 - 1.2 mg/dL   GFR calc non Af Amer >60 >60 mL/min   GFR calc Af Amer >60 >60 mL/min    Comment: (NOTE) The eGFR has been calculated using the CKD EPI equation. This calculation has not been validated in all clinical situations. eGFR's persistently <60 mL/min signify possible Chronic Kidney Disease.    Anion gap 5 5 - 15  Ethanol     Status: None   Collection Time: 08/12/16  1:15 PM  Result Value Ref Range   Alcohol, Ethyl (B) <5 <5 mg/dL    Comment:        LOWEST DETECTABLE LIMIT FOR SERUM ALCOHOL IS 5 mg/dL FOR MEDICAL PURPOSES ONLY   Salicylate level     Status: None   Collection Time: 08/12/16  1:15 PM  Result Value Ref Range   Salicylate Lvl <6.3 2.8 - 30.0 mg/dL  Acetaminophen level     Status: Abnormal   Collection Time: 08/12/16  1:15 PM  Result Value Ref Range   Acetaminophen (Tylenol), Serum <10 (L) 10 - 30 ug/mL    Comment:         THERAPEUTIC CONCENTRATIONS VARY SIGNIFICANTLY. A RANGE OF 10-30 ug/mL MAY BE AN EFFECTIVE CONCENTRATION FOR MANY PATIENTS. HOWEVER, SOME ARE BEST TREATED AT CONCENTRATIONS OUTSIDE THIS RANGE. ACETAMINOPHEN CONCENTRATIONS >150 ug/mL AT 4 HOURS AFTER INGESTION AND >50 ug/mL AT 12 HOURS AFTER INGESTION ARE OFTEN ASSOCIATED WITH TOXIC REACTIONS.   cbc     Status: None   Collection Time: 08/12/16  1:15 PM  Result Value Ref Range   WBC 8.4 4.0 - 10.5 K/uL   RBC 4.43 3.87 - 5.11 MIL/uL   Hemoglobin 13.1 12.0 - 15.0 g/dL   HCT 38.6 36.0 - 46.0 %   MCV 87.1 78.0 - 100.0 fL   MCH 29.6 26.0 - 34.0 pg   MCHC 33.9 30.0 - 36.0 g/dL   RDW 13.3 11.5 - 15.5 %   Platelets 253 150 - 400 K/uL    Blood Alcohol level:  Lab Results  Component Value Date   ETH <5 08/12/2016   ETH <5 68/34/1962    Metabolic Disorder Labs:  Lab Results  Component Value Date   HGBA1C 5.2 05/14/2016   MPG 103 05/14/2016   MPG 114 02/04/2016   Lab Results  Component Value Date   PROLACTIN 32.8 (H) 02/04/2016   Lab Results  Component Value Date   CHOL 135 05/14/2016   TRIG 61 05/14/2016   HDL 58 05/14/2016   CHOLHDL 2.3 05/14/2016   VLDL 12 05/14/2016   LDLCALC 65 05/14/2016   LDLCALC 69 02/04/2016    Current Medications: Current Facility-Administered Medications  Medication Dose Route Frequency Provider Last Rate Last Dose  . acetaminophen (TYLENOL) tablet 650 mg  650 mg Oral Q6H PRN Lurena Nida, NP      . divalproex (DEPAKOTE ER) 24 hr tablet 500 mg  500 mg Oral QHS Lurena Nida, NP   500 mg at 08/12/16 2151  . gabapentin (NEURONTIN) capsule 100 mg  100 mg Oral TID Lurena Nida, NP   100 mg at 08/13/16 1305  . ibuprofen (ADVIL,MOTRIN) tablet 400 mg  400 mg Oral Q6H PRN Rozetta Nunnery, NP   400 mg at 08/12/16 2150  . QUEtiapine (SEROQUEL) tablet 100 mg  100 mg Oral QHS Lurena Nida, NP   100 mg at 08/12/16 2150  . sertraline (ZOLOFT) tablet 50 mg  50 mg Oral Daily Lurena Nida, NP   50 mg  at 08/13/16 0754  . traZODone (DESYREL) tablet 50 mg  50 mg Oral QHS PRN Lurena Nida, NP       PTA Medications: Prescriptions Prior to Admission  Medication Sig Dispense Refill Last Dose  . acetaminophen (TYLENOL) 500 MG tablet Take 1,000 mg by mouth every 6 (six) hours as needed for mild pain or headache.   08/11/2016  . clonazePAM (KLONOPIN) 0.5 MG tablet Take 0.5 mg by mouth 2 (two) times daily as needed for anxiety.   2 months ago  . divalproex (DEPAKOTE ER) 500 MG 24 hr tablet Take 1 tablet (500 mg total) by mouth at bedtime. For mood stabilization 30 tablet 0 2 months ago  . gabapentin (NEURONTIN) 300 MG capsule Take 2 capsules (600 mg total) by mouth 3 (three) times daily. Anxiety control 180 capsule 0 2 months ago  .  QUEtiapine (SEROQUEL) 200 MG tablet Take 1 tablet (200 mg total) by mouth at bedtime. MOOD CONTROL AND INSOMNIA 30 tablet 0 2 months ago  . sertraline (ZOLOFT) 50 MG tablet Take 1 tablet (50 mg total) by mouth daily. depression 30 tablet 0 2 months ago    Musculoskeletal: Strength & Muscle Tone: within normal limits Gait & Station: normal Patient leans: N/A  Psychiatric Specialty Exam: Physical Exam  Review of Systems  Constitutional: Positive for weight loss. Negative for chills and fever.  HENT: Negative.   Eyes: Negative.   Respiratory: Negative.   Cardiovascular: Negative.   Gastrointestinal: Positive for nausea. Negative for abdominal pain, blood in stool, diarrhea and vomiting.  Genitourinary: Negative.   Musculoskeletal: Negative.   Skin: Negative for rash.  Neurological: Positive for headaches. Negative for seizures.  Endo/Heme/Allergies: Negative.   Psychiatric/Behavioral: Positive for depression, hallucinations, substance abuse and suicidal ideas.  All other systems reviewed and are negative.   Blood pressure (!) 87/68, pulse (!) 114, temperature 98.7 F (37.1 C), temperature source Oral, resp. rate 18, height 5' 4"  (1.626 m), weight 122 lb (55.3  kg), last menstrual period 07/29/2016, not currently breastfeeding.Body mass index is 20.94 kg/m.  General Appearance: Fairly Groomed  Eye Contact:  Fair  Speech:  Normal Rate  Volume:  Decreased  Mood:  Depressed  Affect:  constricted, but reactive, smiles at times appropriately   Thought Process:  Linear  Orientation:  Full (Time, Place, and Person)  Thought Content:  recent auditory and visual hallucinations, but states she has not had any x 1 day, not internally preoccupied at this time  Suicidal Thoughts:  No at this time denies any suicidal plan or intention and denies any self injurious ideations, contracts for safety on unit   Homicidal Thoughts:  No denies any homicidal ideations  Memory:  recent and remote grossly intact   Judgement:  Fair  Insight:  Fair  Psychomotor Activity:  Decreased  Concentration:  Concentration: Good and Attention Span: Good  Recall:  Good  Fund of Knowledge:  Good  Language:  Good  Akathisia:  Negative  Handed:  Right  AIMS (if indicated):     Assets:  Desire for Improvement Resilience  ADL's:  Intact  Cognition:  WNL  Sleep:  Number of Hours: 6.75    Treatment Plan Summary: Daily contact with patient to assess and evaluate symptoms and progress in treatment, Medication management, Plan inpatient admission  and medications as below  Observation Level/Precautions:  15 minute checks  Laboratory:  as needed   Psychotherapy:  Milieu, support, groups   Medications:  Restart her prior medication regimen, which she states was helpful and well tolerated, and " was helping me stay out of the hospital". SEROQUEL 100 mgrs QHS, NEURONTIN 100 mgrs TID, DEPAKOTE ER 500 mgrs QHS, ZOLOFT 50 mgrs QDAY . D/C TRAZODONE PRNs - patient states she feels worse on TRAZODONE .  ATIVAN 0.5 mgrs Q 6 hours PRN for anxiety as needed  Consultations:  As needed   Discharge Concerns:  -   Estimated LOS:  Other:     Physician Treatment Plan for Primary Diagnosis:   Bipolar Disorder, Depressed, Cocaine Abuse   Long Term Goal(s): Improvement in symptoms so as ready for discharge  Short Term Goals: Ability to verbalize feelings will improve, Ability to disclose and discuss suicidal ideas, Ability to demonstrate self-control will improve and Ability to identify triggers associated with substance abuse/mental health issues will improve  Physician Treatment Plan for Secondary  Diagnosis: Active Problems:   MDD (major depressive disorder), recurrent severe, without psychosis ([place])  Long Term Goal(s): Improvement in symptoms so as ready for discharge  Short Term Goals: Ability to verbalize feelings will improve, Ability to disclose and discuss suicidal ideas, Ability to demonstrate self-control will improve and Ability to identify triggers associated with substance abuse/mental health issues will improve  I certify that inpatient services furnished can reasonably be expected to improve the patient's condition.    Neita Garnet, MD 11/10/20172:05 PM

## 2016-08-13 NOTE — Progress Notes (Signed)
Patient attended wrap-up group, but did not which to interact.

## 2016-08-13 NOTE — Tx Team (Signed)
Interdisciplinary Treatment and Diagnostic Plan Update  08/13/2016 Time of Session: 3:10 PM  Caitlin Bell MRN: [id number]  Principal Diagnosis: Bipolar Disorder, Depressed   Secondary Diagnoses: Active Problems:   MDD (major depressive disorder), recurrent severe, without psychosis ([place])   Current Medications:  Current Facility-Administered Medications  Medication Dose Route Frequency Provider Last Rate Last Dose  . acetaminophen (TYLENOL) tablet 650 mg  650 mg Oral Q6H PRN Lurena Nida, NP      . divalproex (DEPAKOTE ER) 24 hr tablet 500 mg  500 mg Oral QHS Lurena Nida, NP   500 mg at 08/12/16 2151  . gabapentin (NEURONTIN) capsule 100 mg  100 mg Oral TID Lurena Nida, NP   100 mg at 08/13/16 1305  . ibuprofen (ADVIL,MOTRIN) tablet 400 mg  400 mg Oral Q6H PRN Rozetta Nunnery, NP   400 mg at 08/12/16 2150  . LORazepam (ATIVAN) tablet 0.5 mg  0.5 mg Oral Q6H PRN Jenne Campus, MD      . QUEtiapine (SEROQUEL) tablet 100 mg  100 mg Oral QHS Lurena Nida, NP   100 mg at 08/12/16 2150  . sertraline (ZOLOFT) tablet 50 mg  50 mg Oral Daily Lurena Nida, NP   50 mg at 08/13/16 0754    PTA Medications: Prescriptions Prior to Admission  Medication Sig Dispense Refill Last Dose  . acetaminophen (TYLENOL) 500 MG tablet Take 1,000 mg by mouth every 6 (six) hours as needed for mild pain or headache.   08/11/2016  . clonazePAM (KLONOPIN) 0.5 MG tablet Take 0.5 mg by mouth 2 (two) times daily as needed for anxiety.   2 months ago  . divalproex (DEPAKOTE ER) 500 MG 24 hr tablet Take 1 tablet (500 mg total) by mouth at bedtime. For mood stabilization 30 tablet 0 2 months ago  . gabapentin (NEURONTIN) 300 MG capsule Take 2 capsules (600 mg total) by mouth 3 (three) times daily. Anxiety control 180 capsule 0 2 months ago  . QUEtiapine (SEROQUEL) 200 MG tablet Take 1 tablet (200 mg total) by mouth at bedtime. MOOD CONTROL AND INSOMNIA 30 tablet 0 2 months ago  . sertraline (ZOLOFT) 50 MG  tablet Take 1 tablet (50 mg total) by mouth daily. depression 30 tablet 0 2 months ago    Treatment Modalities: Medication Management, Group therapy, Case management,  1 to 1 session with clinician, Psychoeducation, Recreational therapy.  Patient Stressors: Financial difficulties Medication change or noncompliance  Patient Strengths: Ability for insight Active sense of humor Communication skills General fund of knowledge Motivation for treatment/growth  Physician Treatment Plan for Primary Diagnosis: Bipolar Disorder, Depressed  Long Term Goal(s): Improvement in symptoms so as ready for discharge  Short Term Goals: Ability to verbalize feelings will improve Ability to disclose and discuss suicidal ideas Ability to demonstrate self-control will improve Ability to identify triggers associated with substance abuse/mental health issues will improve Ability to verbalize feelings will improve Ability to disclose and discuss suicidal ideas Ability to demonstrate self-control will improve Ability to identify triggers associated with substance abuse/mental health issues will improve  Medication Management: Evaluate patient's response, side effects, and tolerance of medication regimen.  Therapeutic Interventions: 1 to 1 sessions, Unit Group sessions and Medication administration.  Evaluation of Outcomes: Not Met  Physician Treatment Plan for Secondary Diagnosis: Active Problems:   MDD (major depressive disorder), recurrent severe, without psychosis ([place])   Long Term Goal(s): Improvement in symptoms so as ready for discharge  Short Term  Goals: Ability to verbalize feelings will improve Ability to disclose and discuss suicidal ideas Ability to demonstrate self-control will improve Ability to identify triggers associated with substance abuse/mental health issues will improve Ability to verbalize feelings will improve Ability to disclose and discuss suicidal ideas Ability to  demonstrate self-control will improve Ability to identify triggers associated with substance abuse/mental health issues will improve  Medication Management: Evaluate patient's response, side effects, and tolerance of medication regimen.  Therapeutic Interventions: 1 to 1 sessions, Unit Group sessions and Medication administration.  Evaluation of Outcomes: Not Met   RN Treatment Plan for Primary Diagnosis: Bipolar Disorder, Depressed  Long Term Goal(s): Knowledge of disease and therapeutic regimen to maintain health will improve  Short Term Goals: Ability to verbalize feelings will improve, Ability to disclose and discuss suicidal ideas and Ability to identify and develop effective coping behaviors will improve  Medication Management: RN will administer medications as ordered by provider, will assess and evaluate patient's response and provide education to patient for prescribed medication. RN will report any adverse and/or side effects to prescribing provider.  Therapeutic Interventions: 1 on 1 counseling sessions, Psychoeducation, Medication administration, Evaluate responses to treatment, Monitor vital signs and CBGs as ordered, Perform/monitor CIWA, COWS, AIMS and Fall Risk screenings as ordered, Perform wound care treatments as ordered.  Evaluation of Outcomes: Not Met   LCSW Treatment Plan for Primary Diagnosis: Bipolar Disorder, Depressed  Long Term Goal(s): Safe transition to appropriate next level of care at discharge, Engage patient in therapeutic group addressing interpersonal concerns.  Short Term Goals: Engage patient in aftercare planning with referrals and resources, Increase emotional regulation and Increase skills for wellness and recovery  Therapeutic Interventions: Assess for all discharge needs, 1 to 1 time with Social worker, Explore available resources and support systems, Assess for adequacy in community support network, Educate family and significant other(s) on  suicide prevention, Complete Psychosocial Assessment, Interpersonal group therapy.  Evaluation of Outcomes: Not Met   Progress in Treatment: Attending groups: Pt is new to milieu, continuing to assess  Participating in groups: Pt is new to milieu, continuing to assess  Taking medication as prescribed: Yes, MD continues to assess for medication changes as needed Toleration medication: Yes, no side effects reported at this time Family/Significant other contact made: No, CSW assessing for appropriate contact Patient understands diagnosis: Continuing to assess Discussing patient identified problems/goals with staff: Yes Medical problems stabilized or resolved: Yes Denies suicidal/homicidal ideation: No, endorses passive SI Issues/concerns per patient self-inventory: None Other: N/A  New problem(s) identified: None identified at this time.   New Short Term/Long Term Goal(s): None identified at this time.   Discharge Plan or Barriers: CSW will assess for appropriate discharge plan and relevant barriers.   Reason for Continuation of Hospitalization: Anxiety Depression Hallucinations Medication stabilization Suicidal ideation  Estimated Length of Stay: 3-5 days  Attendees: Patient: 08/13/2016  3:10 PM  Physician: Dr. Parke Poisson 08/13/2016  3:10 PM  Nursing: Mayra Neer, Idell Pickles, RN 08/13/2016  3:10 PM  RN Care Manager: Lars Pinks, RN 08/13/2016  3:10 PM  Social Worker: Adriana Reams, LCSW; Kristin Drinkard, LCSW 08/13/2016  3:10 PM  Recreational Therapist:  08/13/2016  3:10 PM  Other: Catalina Pizza, NP 08/13/2016  3:10 PM  Other:  08/13/2016  3:10 PM  Other: 08/13/2016  3:10 PM    Scribe for Treatment Team: Gladstone Lighter, LCSW 08/13/2016 3:10 PM

## 2016-08-13 NOTE — [facility] Group Notes (Signed)
[facility] LCSW Group Therapy 08/13/2016 1:15pm  Type of Therapy: Group Therapy- Feelings Around Relapse and Recovery  Participation Level: Active   Participation Quality:  Appropriate  Affect:  Appropriate  Cognitive: Alert and Oriented   Insight:  Developing   Engagement in Therapy: Developing/Improving and Engaged   Modes of Intervention: Clarification, Confrontation, Discussion, Education, Exploration, Limit-setting, Orientation, Problem-solving, Rapport Building, Dance movement psychotherapisteality Testing, Socialization and Support  Summary of Progress/Problems: The topic for today was feelings about relapse. The group discussed what relapse prevention is to them and identified triggers that they are on the path to relapse. Members also processed their feeling towards relapse and were able to relate to common experiences. Group also discussed coping skills that can be used for relapse prevention.  Pt participated in group discussion and identified that isolation and withdrawal from activities that she usually enjoys are warning signs for her depression. She also reports that her behaviors are negative as she often lashes out at others.       Therapeutic Modalities:   Cognitive Behavioral Therapy Solution-Focused Therapy Assertiveness Training Relapse Prevention Therapy    Caitlin ShanksLauren Vaughan Garfinkle, LCSW [phone] 08/13/2016 3:12 PM

## 2016-08-13 NOTE — [facility] Group Notes (Signed)
Patient attend group. 

## 2016-08-13 NOTE — Progress Notes (Signed)
Patient denies SI, HI and AVH but reports an increase in anixety.  Patient states that she is feeling depressed because she had to give up the rights to her children.  Patient states that her goal is to get back on her medications.   Assess patient for safety, offer medications as prescribed, engage patient in 1:1 staff talks.   Continue to monitor as prescribed. Patient able to maintain safety.

## 2016-08-14 DIAGNOSIS — F3132 Bipolar disorder, current episode depressed, moderate: Secondary | ICD-10-CM | POA: Diagnosis present

## 2016-08-14 DIAGNOSIS — Z79899 Other long term (current) drug therapy: Secondary | ICD-10-CM

## 2016-08-14 DIAGNOSIS — F332 Major depressive disorder, recurrent severe without psychotic features: Secondary | ICD-10-CM

## 2016-08-14 DIAGNOSIS — Z87891 Personal history of nicotine dependence: Secondary | ICD-10-CM

## 2016-08-14 MED ORDER — ONDANSETRON 4 MG PO TBDP
4.0000 mg | ORAL_TABLET | Freq: Three times a day (TID) | ORAL | Status: DC | PRN
  Administered 2016-08-15 – 2016-08-18 (×3): 4 mg via ORAL
  Filled 2016-08-14 (×3): qty 1

## 2016-08-14 MED ORDER — QUETIAPINE FUMARATE 200 MG PO TABS
200.0000 mg | ORAL_TABLET | Freq: Every day | ORAL | Status: DC
  Administered 2016-08-14 – 2016-08-15 (×2): 200 mg via ORAL
  Filled 2016-08-14 (×5): qty 1

## 2016-08-14 NOTE — Progress Notes (Signed)
D.  Pt very anxious on approach, stated initially that she didn't feel she could contract for safety, very tearful.  Pt requested to take HS medications early due to this.  After Pt took medication, Pt instructed to stay visible in dayroom and not to go to her room until feeling better.  Pt came up later and was much brighter.  She requested conditioner for her hair, took a shower, and stated she felt like she could safely contract and go to bed.  Pt given permission to do this.  Pt did attend evening wrap up group.  Pt became more engaged on unit after taking medication.  A.  Support and encouragement offered, medication given as ordered.  R.  Pt safe on unit, will continue to monitor closely.

## 2016-08-14 NOTE — [facility] Group Notes (Signed)
[facility] Group Notes: (Clinical Social Work)   08/14/2016      Type of Therapy:  Group Therapy   Participation Level:  Did Not Attend despite MHT prompting   Ambrose MantleMareida Grossman-Orr, LCSW 08/14/2016, 12:13 PM

## 2016-08-14 NOTE — Progress Notes (Signed)
Patient denies SI, HI and AVH but reports an increase in anixety.  Patient states that she is feeling depressed because she had to give up the rights to her children.  Patient states that her goal is to get back on her medications.   Assess patient for safety, offer medications as prescribed, engage patient in 1:1 staff talks.   Continue to monitor as prescribed. Patient able to maintain safety.  
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
## 2016-08-14 NOTE — Progress Notes (Signed)
Patient attended group, but did not which to participate.

## 2016-08-15 DIAGNOSIS — F1490 Cocaine use, unspecified, uncomplicated: Secondary | ICD-10-CM

## 2016-08-15 DIAGNOSIS — F3132 Bipolar disorder, current episode depressed, moderate: Principal | ICD-10-CM

## 2016-08-15 DIAGNOSIS — Z9889 Other specified postprocedural states: Secondary | ICD-10-CM

## 2016-08-15 DIAGNOSIS — F1099 Alcohol use, unspecified with unspecified alcohol-induced disorder: Secondary | ICD-10-CM

## 2016-08-15 NOTE — Progress Notes (Addendum)
Caitlin Bell is sleepy ( she has been asleep..in her bed since this morning at 0730). She did complet her daily assessment when she got OOB at noon. She rated her suicidality " 0" and she rated her depression, hopelessness and anxiety " 05/12/09", repectively. She appears acutelty sad, flat and depressed. She is seen whispering to other group members on hte [address] hall. She demonstrates poor self esteem issues and she  Shares she feels she is a bad mother because she lost custody of her children. She is offerend pos feedback and encouraged to think about the good things she has done.Marland Kitchen.aortic stenosis opposed to staying in the negatiive... Safety in place.

## 2016-08-15 NOTE — Progress Notes (Signed)
[facility] Group Notes:  (Nursing/MHT/Case Management/Adjunct)  Date:  08/15/2016  Time:  10:26 PM  Type of Therapy:  Psychoeducational Skills  Participation Level:  Active  Participation Quality:  Appropriate  Affect:  Appropriate  Cognitive:  Appropriate  Insight:  Appropriate  Engagement in Group:  Engaged  Modes of Intervention:  Education  Summary of Progress/Problems: The patient shared with the group that her day was a 7 out of 10. She explained to the group that she had a good talk over the phone with her daughter and that she was able to play a board game with her peers. She also states that she was more sociable today. As for the theme of the day, her support system will be comprised of her sister.   Caitlin CocaGOODMAN, Caitlin Bell 08/15/2016, 10:26 PM

## 2016-08-15 NOTE — [facility] Group Notes (Signed)
[facility] Group Notes:  Healthy Support Systems   Date:  08/15/2016  Time:  6:04 PM  Type of Therapy:  Psychoeducational Skills  Participation Level:  Active  Participation Quality:  Appropriate  Affect:  Blunted  Cognitive:  Appropriate  Insight:  Limited  Engagement in Group:  Engaged  Modes of Intervention:  Discussion and Education  Summary of Progress/Problems: [name] attended group and was engaged.   Marzetta BoardDopson, Dennice Tindol E 08/15/2016, 6:04 PM

## 2016-08-15 NOTE — [facility] Group Notes (Signed)
[facility] Group Notes: (Clinical Social Work)   08/15/2016      Type of Therapy:  Group Therapy   Participation Level:  Did Not Attend despite MHT prompting   Tyriq Moragne Grossman-Orr, LCSW 08/15/2016, 11:42 AM     

## 2016-08-15 NOTE — Progress Notes (Signed)
Patient ID: Caitlin RudVictoria L Bell, female   DOB: 08/23/1991, 25 y.o.   MRN: [id number] D: Client visible on the unit, seen frequently on the phone. Client reports "kind of depressed, comes and goes, but not as bad as when I first got here" anxiety "9" of 10, "been able to calm self down by deep breathing and coloring" A: Writer provided emotional support, commended client for using coping skills, encouraged to consider if information given on the phone intensifies anxiety level. Medications reviewed, administered as ordered. Client encouraged to report any other concerns. Staff will monitor q6815min for safety. R: Client is safe on the unit, attended group.

## 2016-08-15 NOTE — Plan of Care (Signed)
Problem: Coping: Goal: Ability to cope will improve Outcome: Progressing Pt did not act on feelings to self harm, was able to calm down and appropriately cope with emotions

## 2016-08-15 NOTE — Progress Notes (Signed)
[facility]  08/15/2016 10:52 AM Caitlin Bell  MRN:  [id number] Subjective:  "I'm feeling better with Seroquel.  I sleep better.  I still feel sometime detached from my body but less intense and less frequent."  Objective: Pt seen and chart reviewed.  Patient started Seroquel 200 mg at bedtime which is helping her sleep and also she feels less intense detached from her body.  However she still feel depressed and continued to endorse hallucination mostly at bedtime.  She endorsed these hallucinations are random and sometime she feels very nervous and shaky.  However she feels Seroquel helped her anxiety and sleep. She reported no side effects of medication.  She is going to the groups and not seeing any disruptive behavior.  Patient is still feel isolated, withdrawn and lack of energy.  Principal Problem: Bipolar affective disorder, currently depressed, moderate (HCC) Diagnosis:   Patient Active Problem List   Diagnosis Date Noted  . Bipolar affective disorder, currently depressed, moderate (HCC) [F31.32] 08/14/2016  . Cholestasis of pregnancy [O26.619, K83.1] 10/31/2014  . H/O anorexia nervosa [Z86.59] 10/31/2014  . H/O bulimia nervosa [Z86.59] 10/31/2014  . Gestational diabetes [O24.419] 10/31/2014  . Umbilical hernia [K42.9] 10/31/2014  . Hx of preterm delivery  [O09.219] 10/31/2014  . Generalized anxiety disorder [F41.1] 10/31/2014  . History of marijuana use [Z87.898] 10/31/2014  . Cerebral palsy (HCC) [G80.9] 10/31/2014  . Hx of PTL (preterm labor), current pregnancy [O09.219] 10/30/2014   Total Time spent with patient: 15 minutes  Past Psychiatric History: See H&P  Past Medical History:  Past Medical History:  Diagnosis Date  . Anxiety   . Bipolar disorder (HCC)   . 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 Depression   . Gestational diabetes     Past Surgical History:  Procedure Laterality Date  . CHOLECYSTECTOMY  2014  . HERNIA REPAIR      Family History: History reviewed. No pertinent family history. Family Psychiatric  History: See H&P Social History:  History  Alcohol Use  . 2.4 oz/week  . 4 Shots of liquor per week    Comment: 2 days; weekly use     History  Drug Use  . Types: Cocaine    Comment: cocaine Sunday    Social History   Social History  . Marital status: Single    Spouse name: N/A  . Number of children: N/A  . Years of education: N/A   Social History Main Topics  . Smoking status: Former Smoker    Quit date: 10/31/2011  . Smokeless tobacco: Never Used  . Alcohol use 2.4 oz/week    4 Shots of liquor per week     Comment: 2 days; weekly use  . Drug use:     Types: Cocaine     Comment: cocaine Sunday  . Sexual activity: Yes    Birth control/ protection: None   Other Topics Concern  . None   Social History Narrative  . None   Additional Social History:      Sleep: Good  Appetite:  Good  Current Medications: Current Facility-Administered Medications  Medication Dose Route Frequency Provider Last Rate Last Dose  . acetaminophen (TYLENOL) tablet 650 mg  650 mg Oral Q6H PRN Kristeen MansFran E Hobson, NP      . divalproex (DEPAKOTE ER) 24 hr tablet 500 mg  500 mg Oral QHS Kristeen MansFran E Hobson, NP   500 mg at 08/14/16 2049  . ibuprofen (ADVIL,MOTRIN) tablet 400 mg  400 mg Oral Q6H PRN Jackelyn PolingJason A Berry, NP   400 mg at 08/14/16 2049  . LORazepam (ATIVAN) tablet 0.5 mg  0.5 mg Oral Q6H PRN Craige CottaFernando A Cobos, MD   0.5 mg at 08/14/16 2049  . ondansetron (ZOFRAN-ODT) disintegrating tablet 4 mg  4 mg Oral Q8H PRN Beau FannyJohn C Withrow, FNP      . QUEtiapine (SEROQUEL) tablet 200 mg  200 mg Oral QHS Beau FannyJohn C Withrow, FNP   200 mg at 08/14/16 2049  . sertraline (ZOLOFT) tablet 50 mg  50 mg Oral Daily Kristeen MansFran E Hobson, NP   50 mg at 08/14/16 0900    Lab Results: No results found for this or any previous visit (from the past 48 hour(s)).  Blood Alcohol level:  Lab Results  Component Value Date   ETH <5 08/12/2016   ETH <5  05/13/2016    Metabolic Disorder Labs: Lab Results  Component Value Date   HGBA1C 5.2 05/14/2016   MPG 103 05/14/2016   MPG 114 02/04/2016   Lab Results  Component Value Date   PROLACTIN 32.8 (H) 02/04/2016   Lab Results  Component Value Date   CHOL 135 05/14/2016   TRIG 61 05/14/2016   HDL 58 05/14/2016   CHOLHDL 2.3 05/14/2016   VLDL 12 05/14/2016   LDLCALC 65 05/14/2016   LDLCALC 69 02/04/2016    Physical Findings: AIMS: Facial and Oral Movements Muscles of Facial Expression: None, normal Lips and Perioral Area: None, normal Jaw: None, normal Tongue: None, normal,Extremity Movements Upper (arms, wrists, hands, fingers): None, normal Lower (legs, knees, ankles, toes): None, normal, Trunk Movements Neck, shoulders, hips: None, normal, Overall Severity Severity of abnormal movements (highest score from questions above): None, normal Incapacitation due to abnormal movements: None, normal Patient's awareness of abnormal movements (rate only patient's report): No Awareness, Dental Status Current problems with teeth and/or dentures?: No Does patient usually wear dentures?: No  CIWA:    COWS:     Musculoskeletal: Strength & Muscle Tone: within normal limits Gait & Station: normal Patient leans: N/A  Psychiatric Specialty Exam: Physical Exam  Review of Systems  Constitutional: Negative.   Eyes: Negative.   Respiratory: Negative.   Cardiovascular: Negative.   Genitourinary: Negative.   Musculoskeletal: Negative.   Skin: Negative.   Neurological: Negative for tremors.  Psychiatric/Behavioral: Positive for depression and substance abuse. Negative for hallucinations and suicidal ideas. The patient is nervous/anxious. The patient does not have insomnia.   All other systems reviewed and are negative.   Blood pressure 99/66, pulse (!) 106, temperature 97.9 F (36.6 C), temperature source Oral, resp. rate 16, height 5\' 4"  (1.626 m), weight 55.3 kg (122 lb), last  menstrual period 07/29/2016, not currently breastfeeding.Body mass index is 20.94 kg/m.  General Appearance: Casual  Eye Contact:  Good  Speech:  Clear and Coherent and Normal Rate  Volume:  Normal  Mood:  Depressed  Affect:  Appropriate, Congruent and Depressed  Thought Process:  Coherent, Goal Directed, Linear and Descriptions of Associations: Loose  Orientation:  Full (Time, Place, and Person)  Thought Content:  Endorse hallucination mostly at nighttime which are vague, belief people calling her name.  Suicidal Thoughts:  No  Homicidal Thoughts:  No  Memory:  Immediate;   Fair Recent;   Fair Remote;   Fair  Judgement:  Fair  Insight:  Fair  Psychomotor Activity:  Normal  Concentration:  Concentration: Fair and Attention Span: Fair  Recall:  [employer]Fair  Fund of Knowledge:  Fair  Language:  Fair  Akathisia:  No  Handed:    AIMS (if indicated):     Assets:  Communication Skills Desire for Improvement Resilience Social Support  ADL's:  Intact  Cognition:  WNL  Sleep:  Number of Hours: 4.75   Treatment Plan Summary: Bipolar affective disorder, currently depressed, moderate (HCC) unstable, managed as below:  Medications: -continue Seroquel 200 mg at bedtime to help insomnia, hallucination and negative thoughts.  She is tolerating medication without any side effects. -Continue Depakote 500mg  po qhs (mood stabilization), we will do Depakote level in the morning -Continue zoloft 50mg  po daily for depressive symptoms -Continue Ativan 0.5mg  po q6h prn withdrawal/anxiety  -encouraged to persevere in group milieu therapy. -Child psychotherapist to start working on disposition.   Latrel Szymczak T., MD 08/15/2016, 10:52 AM

## 2016-08-16 LAB — VALPROIC ACID LEVEL: VALPROIC ACID LVL: 51 ug/mL (ref 50.0–100.0)

## 2016-08-16 MED ORDER — DIVALPROEX SODIUM ER 500 MG PO TB24
750.0000 mg | ORAL_TABLET | Freq: Every day | ORAL | Status: DC
  Administered 2016-08-16 – 2016-08-17 (×2): 750 mg via ORAL
  Filled 2016-08-16 (×4): qty 1

## 2016-08-16 MED ORDER — GABAPENTIN 100 MG PO CAPS
100.0000 mg | ORAL_CAPSULE | Freq: Three times a day (TID) | ORAL | Status: DC
  Administered 2016-08-16 – 2016-08-17 (×4): 100 mg via ORAL
  Filled 2016-08-16 (×9): qty 1

## 2016-08-16 MED ORDER — QUETIAPINE FUMARATE 300 MG PO TABS
300.0000 mg | ORAL_TABLET | Freq: Every day | ORAL | Status: DC
  Administered 2016-08-16 – 2016-08-18 (×3): 300 mg via ORAL
  Filled 2016-08-16 (×4): qty 1
  Filled 2016-08-16: qty 7
  Filled 2016-08-16: qty 1

## 2016-08-16 NOTE — Progress Notes (Signed)
D:  Patient's self inventory sheet, patient sleeps good, no sleep medication given.  Fair appetite, low energy level, poor concentration.  Rated depression, hopeless and anxiety #10.  Denied withdrawals.  Denied SI.  Physical problems, lightheaded, dizzy, headaches, worst pain #7 in past 24 hours, back.  No pain medication.  Goal is to be more social.  Plans to get up and go.  No discharge plans. A:  Medications administered per MD orders.  Emotional support and encouragement given patient. R:  Denied SI and HI, contracts for safety.  Denied A/V hallucinations.  Safety maintained with 15 minute checks.

## 2016-08-16 NOTE — [facility] Group Notes (Signed)
Pt did not attend group. 

## 2016-08-16 NOTE — Progress Notes (Signed)
Per Pt request, CSW placed call to Pt's attorney Caitlin Bell to let the office know that Pt has been hospitalized and would miss her court date this morning. CSW also faxed letters to the [facility] and [place] to inform them of Pt's current hospitalization.   CSW received call back from attorney confirming that the case had been continued to 09/22/16.  CSW notified Pt.  Caitlin ShanksLauren Keerat Denicola, LCSW Clinical Social Work [phone]

## 2016-08-16 NOTE — [facility] Group Notes (Signed)
[facility] LCSW Group Therapy  08/16/2016 1:15pm  Type of Therapy:  Group Therapy vercoming Obstacles  Pt was present for a few minutes in group after coming in late; however, she left promptly and did not return.  Vernie ShanksLauren Kryslyn Helbig, LCSW 08/16/2016 2:44 PM

## 2016-08-16 NOTE — Plan of Care (Signed)
Problem: Education: Goal: Ability to make informed decisions regarding treatment will improve Outcome: Progressing Nurse discussed suicidal thoughts/depression/coping skills with patient.        

## 2016-08-16 NOTE — Progress Notes (Signed)
[facility]  08/16/2016 4:34 PM Caitlin Bell  MRN:  [id number] Subjective:  Patient reports she is doing " a little better" but still feels depressed, " in a funk that I cannot get out of ".  Reports lingering sadness, low energy level , and some ongoing auditory hallucinations, although decreased compared to before and " no longer saying bad things". Of note, does not present internally preoccupied, no overt psychotic symptoms. She feels her medications are helping, but feels she needs " higher dose ". Objective: Pt seen and chart reviewed.  Patient reports ongoing depression, lack of energy, and some auditory hallucinations. Describes some passive thoughts of death, dying, and states " it's like a lack of purpose" referring to how she feels, but denies any current plan or intention of suicide or of hurting self and contracts for safety on unit. Denies medication side effects. Valproic Serum level is 51- low therapeutic. No disruptive or agitated behaviors on unit,going to some groups , but overall milieu participation has been limited .   Principal Problem: Bipolar affective disorder, currently depressed, moderate (HCC) Diagnosis:   Patient Active Problem List   Diagnosis Date Noted  . Bipolar affective disorder, currently depressed, moderate (HCC) [F31.32] 08/14/2016  . Cholestasis of pregnancy [O26.619, K83.1] 10/31/2014  . H/O anorexia nervosa [Z86.59] 10/31/2014  . H/O bulimia nervosa [Z86.59] 10/31/2014  . Gestational diabetes [O24.419] 10/31/2014  . Umbilical hernia [K42.9] 10/31/2014  . Hx of preterm delivery  [O09.219] 10/31/2014  . Generalized anxiety disorder [F41.1] 10/31/2014  . History of marijuana use [Z87.898] 10/31/2014  . Cerebral palsy (HCC) [G80.9] 10/31/2014  . Hx of PTL (preterm labor), current pregnancy [O09.219] 10/30/2014   Total Time spent with patient: 20 minutes   Past Psychiatric History: See H&P  Past Medical History:  Past Medical  History:  Diagnosis Date  . Anxiety   . Bipolar disorder (HCC)   . 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 Depression   . Gestational diabetes     Past Surgical History:  Procedure Laterality Date  . CHOLECYSTECTOMY  2014  . HERNIA REPAIR     Family History: History reviewed. No pertinent family history. Family Psychiatric  History: See H&P Social History:  History  Alcohol Use  . 2.4 oz/week  . 4 Shots of liquor per week    Comment: 2 days; weekly use     History  Drug Use  . Types: Cocaine    Comment: cocaine Sunday    Social History   Social History  . Marital status: Single    Spouse name: N/A  . Number of children: N/A  . Years of education: N/A   Social History Main Topics  . Smoking status: Former Smoker    Quit date: 10/31/2011  . Smokeless tobacco: Never Used  . Alcohol use 2.4 oz/week    4 Shots of liquor per week     Comment: 2 days; weekly use  . Drug use:     Types: Cocaine     Comment: cocaine Sunday  . Sexual activity: Yes    Birth control/ protection: None   Other Topics Concern  . None   Social History Narrative  . None   Additional Social History:      Sleep:  Fair   Appetite:  Good  Current Medications: Current Facility-Administered Medications  Medication Dose Route Frequency Provider Last Rate Last Dose  . acetaminophen (TYLENOL) tablet 650 mg  650 mg Oral Q6H  PRN Kristeen Mans, NP      . divalproex (DEPAKOTE ER) 24 hr tablet 750 mg  750 mg Oral QHS Vayla Wilhelmi A Ellouise Mcwhirter, MD      . gabapentin (NEURONTIN) capsule 100 mg  100 mg Oral TID Craige Cotta, MD   100 mg at 08/16/16 1632  . ibuprofen (ADVIL,MOTRIN) tablet 400 mg  400 mg Oral Q6H PRN Jackelyn Poling, NP   400 mg at 08/16/16 1633  . LORazepam (ATIVAN) tablet 0.5 mg  0.5 mg Oral Q6H PRN Craige Cotta, MD   0.5 mg at 08/15/16 2149  . ondansetron (ZOFRAN-ODT) disintegrating tablet 4 mg  4 mg Oral Q8H PRN Beau Fanny, FNP   4 mg at 08/16/16 0844  . QUEtiapine  (SEROQUEL) tablet 300 mg  300 mg Oral QHS Rockey Situ Latrise Bowland, MD      . sertraline (ZOLOFT) tablet 50 mg  50 mg Oral Daily Kristeen Mans, NP   50 mg at 08/16/16 1610    Lab Results:  Results for orders placed or performed during the hospital encounter of 08/12/16 (from the past 48 hour(s))  Valproic acid level     Status: None   Collection Time: 08/16/16  6:12 AM  Result Value Ref Range   Valproic Acid Lvl 51 50.0 - 100.0 ug/mL    Comment: Performed at [facility]    Blood Alcohol level:  Lab Results  Component Value Date    [facility] <5 08/12/2016   ETH <5 05/13/2016    Metabolic Disorder Labs: Lab Results  Component Value Date   HGBA1C 5.2 05/14/2016   MPG 103 05/14/2016   MPG 114 02/04/2016   Lab Results  Component Value Date   PROLACTIN 32.8 (H) 02/04/2016   Lab Results  Component Value Date   CHOL 135 05/14/2016   TRIG 61 05/14/2016   HDL 58 05/14/2016   CHOLHDL 2.3 05/14/2016   VLDL 12 05/14/2016   LDLCALC 65 05/14/2016   LDLCALC 69 02/04/2016    Physical Findings: AIMS: Facial and Oral Movements Muscles of Facial Expression: None, normal Lips and Perioral Area: None, normal Jaw: None, normal Tongue: None, normal,Extremity Movements Upper (arms, wrists, hands, fingers): None, normal Lower (legs, knees, ankles, toes): None, normal, Trunk Movements Neck, shoulders, hips: None, normal, Overall Severity Severity of abnormal movements (highest score from questions above): None, normal Incapacitation due to abnormal movements: None, normal Patient's awareness of abnormal movements (rate only patient's report): No Awareness, Dental Status Current problems with teeth and/or dentures?: No Does patient usually wear dentures?: No  CIWA:  CIWA-Ar Total: 1 COWS:  COWS Total Score: 2  Musculoskeletal: Strength & Muscle Tone: within normal limits Gait & Station: normal Patient leans: N/A  Psychiatric Specialty Exam: Physical Exam  Review of Systems   Constitutional: Negative.   Eyes: Negative.   Respiratory: Negative.   Cardiovascular: Negative.   Genitourinary: Negative.   Musculoskeletal: Negative.   Skin: Negative.   Neurological: Negative for tremors.  Psychiatric/Behavioral: Positive for depression and substance abuse. Negative for hallucinations and suicidal ideas. The patient is nervous/anxious. The patient does not have insomnia.   All other systems reviewed and are negative.   Blood pressure 106/69, pulse 86, temperature 98.2 F (36.8 C), temperature source Oral, resp. rate 16, height 5\' 4"  (1.626 m), weight 122 lb (55.3 kg), last menstrual period 07/29/2016, not currently breastfeeding.Body mass index is 20.94 kg/m.  General Appearance: Casual  Eye Contact:  Good  Speech:  Normal Rate  Volume:  Decreased  Mood:  Remains depressed  Affect:  Appropriate and vaguely constricted but reactive   Thought Process:  Linear  Orientation:  Full (Time, Place, and Person)  Thought Content:  Endorses auditory hallucinations, partially improved compared to admission, denies command hallucinations, does not appear internally preoccupied at this time,no delusions expressed  Suicidal Thoughts:  No currently denies suicidal plan or intention and contracts for safety on unit   Homicidal Thoughts:  No denies   Memory:  Recent and remote grossly intact   Judgement:  Fair  Insight:  Fair  Psychomotor Activity:  Decreased   Concentration:  Concentration: Good and Attention Span: Good  Recall:  Good  Fund of Knowledge:  Good  Language:  Good  Akathisia:  No  Handed:    AIMS (if indicated):     Assets:  Communication Skills Desire for Improvement Resilience Social Support  ADL's:  Intact  Cognition:  WNL  Sleep:  Number of Hours: 5.25   Treatment Plan Summary: Bipolar affective disorder, currently depressed, moderate (HCC)  Assessment - patient remains depressed, sad, although partially improved compared to admission . Reports  improved but persistent auditory hallucinations, mainly at night time. She identifies lack of daily structure and activity, homelessness, as major stressors. At this time denies suicidal ideations, and remains future oriented . Tolerating medications well thus far .   Medications: -Increase Seroquel to 300 mg QHS for mood disorder, psychotic symptoms, and insomnia -Increase  Depakote  ER   to 750 mgrs QHS to address ongoing mood disorder symptoms -Continue zoloft 50mg  QDAY for depressive symptoms -Continue Ativan 0.5mg  Q 6H  PRN withdrawal/anxiety  -Encourage increased group and milieu participation to work on coping skills and symptom reduction -Treatment team working on  Disposition planning options.   Nehemiah MassedOBOS, Lemma Tetro, MD 08/16/2016, 4:34 PM Patient ID: Caitlin RudVictoria L Bell, female   DOB: 09/19/1991, 25 y.o.   MRN: [id number]

## 2016-08-16 NOTE — Plan of Care (Signed)
Problem: Coping: Goal: Ability to identify and develop effective coping behavior will improve Outcome: Progressing Client able to identify effective coping behavior AEB using two coping skills "deep breathing" and "coloring"

## 2016-08-16 NOTE — Progress Notes (Signed)
Recreation Therapy Notes  Date: 08/16/16 Time: 0930 Location: [address] Dayroom  Group Topic: Stress Management  Goal Area(s) Addresses:  Patient will verbalize importance of using healthy stress management.  Patient will identify positive emotions associated with healthy stress management.   Intervention: Stress Management  Activity :  Peaceful Waves.  LRT introduced to the stress management technique of guided imagery.  LRT read a script to engage patients in the activity.  Patients were to follow along as LRT read script to participate in activity.  Education:  Stress Management, Discharge Planning.   Education Outcome: Acknowledges edcuation/In group clarification offered/Needs additional education  Clinical Observations/Feedback: Pt did not attend group.    Caroll RancherMarjette Sallie Maker, LRT/CTRS         Caroll RancherLindsay, Ruth Tully A 08/16/2016 12:27 PM

## 2016-08-16 NOTE — Progress Notes (Signed)
[facility] Group Notes:  (Nursing/MHT/Case Management/Adjunct)  Date:  08/16/2016  Time:  9:51 PM  Type of Therapy:  Psychoeducational Skills  Participation Level:  Active  Participation Quality:  Appropriate  Affect:  Appropriate  Cognitive:  Appropriate  Insight:  Appropriate  Engagement in Group:  Developing/Improving  Modes of Intervention:  Education  Summary of Progress/Problems: The patient verbalized in group that she did not have a very good day. The patient explained that she slept for the majority of the day and that she did not feel well. She did verbalize that she had a good visit with her boyfriend this evening however. As for the theme of the day, her wellness strategy will be to take her medication as prescribed.   Hazle CocaGOODMAN, Deyci Gesell S 08/16/2016, 9:51 PM

## 2016-08-17 NOTE — Plan of Care (Signed)
Problem: Health Behavior/Discharge Planning: Goal: Identification of resources available to assist in meeting health care needs will improve Outcome: Progressing Nurse discussed suicidal thoughts/ depression/ anxiety/ coping skills with patient.

## 2016-08-17 NOTE — Progress Notes (Signed)
D:  Patient's self inventory sheet, patient has fair sleep, no sleep medication given.  Fair appetite, normal energy level, poor concentration.  Rated depression, hopeless and anxiety #10  Denied withdrawals.  SI, contracts for safety, no plan.  Physical problems, lightheaded, headaches.  Denied physical pain.  Goal is to read, socialize.  Plans to go to dayroom.  Continues to see shadows and hearing things, thoughts of suicide also.  Denied discharge plan. A:  Medications administered per MD orders.  Emotional support and encouragement given patient. R:  Denied HI.  Stated she continues to have SI thoughts, no plan, contracts for safety.  Continues to see shadows/people telling her to kill herself. Patient asked nurse if she needs a 1:1.  Patient stated she does not have a plan [facility], contracts for safety.  Charge nurse informed.

## 2016-08-17 NOTE — Progress Notes (Signed)
Recreation Therapy Notes  Animal-Assisted Activity (AAA) Program Checklist/Progress Notes Patient Eligibility Criteria Checklist & Daily Group note for Rec TxIntervention  Date: 11.14.2017 Time: 2:45pm [address]: [address]  AAA/T Program Assumption of Risk Form signed by Patient/ or Parent Legal Guardian Yes  Patient is free of allergies or sever asthma Yes  Patient reports no fear of animals Yes  Patient reports no history of cruelty to animals Yes  Patient understands his/her participation is voluntary Yes  Patient washes hands before animal contact Yes  Patient washes hands after animal contact Yes  Behavioral Response: Appropriate   Education:Hand Washing, Appropriate Animal Interaction   Education Outcome: Acknowledges education.   Clinical Observations/Feedback: Patient attended session and interacted appropriately with therapy dog and peers.   Caitlin Bell, LRT/CTRS        Caitlin Bell L 08/17/2016 3:04 PM

## 2016-08-17 NOTE — [facility] Group Notes (Signed)
The focus of this group is to educate the patient on the purpose and policies of crisis stabilization and provide a format to answer questions about their admission.  The group details unit policies and expectations of patients while admitted.  Patient did not attend 0900 nurse education orientation group this morning.  Patient stayed in her room. 

## 2016-08-17 NOTE — Progress Notes (Signed)
[facility]  08/17/2016 12:15 PM Caitlin Bell  MRN:  [id number] Subjective:  Reports some ongoing depression, but in general states she is feeling better. She states medications have been helpful and currently does not endorse severe side effects, but does report she has been feeling vaguely nauseous, but no vomiting, no diarrhea.   States hallucinatory experiences have been improving and " are not as much" after Seroquel increase . Objective: Pt seen and chart reviewed.  Case discussed with treatment team. Patient has been visible on unit, going to some groups, and states she has been more interactive with peers. States that although still depressed, she has been feeling " a little better" and describes increasing interest in reading and in being sociable . As noted, describes decreased psychotic symptoms, and does not currently present internally preoccupied.  As she improves she is becoming more future oriented, and today discussed disposition planning- states [facility]. No disruptive or agitated behaviors on unit, visible on unit. She is tolerating medications - which were titrated up yesterday- well, although reports feeling vaguely nauseous today.  Principal Problem: Bipolar affective disorder, currently depressed, moderate (HCC) Diagnosis:   Patient Active Problem List   Diagnosis Date Noted  . Bipolar affective disorder, currently depressed, moderate (HCC) [F31.32] 08/14/2016  . Cholestasis of pregnancy [O26.619, K83.1] 10/31/2014  . H/O anorexia nervosa [Z86.59] 10/31/2014  . H/O bulimia nervosa [Z86.59] 10/31/2014  . Gestational diabetes [O24.419] 10/31/2014  . Umbilical hernia [K42.9] 10/31/2014  . Hx of preterm delivery  [O09.219] 10/31/2014  . Generalized anxiety disorder [F41.1] 10/31/2014  . History of marijuana use [Z87.898] 10/31/2014  . Cerebral palsy (HCC) [G80.9] 10/31/2014  . Hx of PTL (preterm  labor), current pregnancy [O09.219] 10/30/2014   Total Time spent with patient: 20 minutes   Past Psychiatric History: See H&P  Past Medical History:  Past Medical History:  Diagnosis Date  . Anxiety   . Bipolar disorder (HCC)   . 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 Depression   . Gestational diabetes     Past Surgical History:  Procedure Laterality Date  . CHOLECYSTECTOMY  2014  . HERNIA REPAIR     Family History: History reviewed. No pertinent family history. Family Psychiatric  History: See H&P Social History:  History  Alcohol Use  . 2.4 oz/week  . 4 Shots of liquor per week    Comment: 2 days; weekly use     History  Drug Use  . Types: Cocaine    Comment: cocaine Sunday    Social History   Social History  . Marital status: Single    Spouse name: N/A  . Number of children: N/A  . Years of education: N/A   Social History Main Topics  . Smoking status: Former Smoker    Quit date: 10/31/2011  . Smokeless tobacco: Never Used  . Alcohol use 2.4 oz/week    4 Shots of liquor per week     Comment: 2 days; weekly use  . Drug use:     Types: Cocaine     Comment: cocaine Sunday  . Sexual activity: Yes    Birth control/ protection: None   Other Topics Concern  . None   Social History Narrative  . None   Additional Social History:      Sleep:   Improving   Appetite:  Good  Current Medications: Current Facility-Administered Medications  Medication Dose Route  Frequency Provider Last Rate Last Dose  . acetaminophen (TYLENOL) tablet 650 mg  650 mg Oral Q6H PRN Kristeen Mans, NP      . divalproex (DEPAKOTE ER) 24 hr tablet 750 mg  750 mg Oral QHS Rockey Situ Cobos, MD   750 mg at 08/16/16 2210  . gabapentin (NEURONTIN) capsule 100 mg  100 mg Oral TID Craige Cotta, MD   100 mg at 08/17/16 0813  . ibuprofen (ADVIL,MOTRIN) tablet 400 mg  400 mg Oral Q6H PRN Jackelyn Poling, NP   400 mg at 08/16/16 1633  . LORazepam (ATIVAN) tablet 0.5 mg  0.5  mg Oral Q6H PRN Craige Cotta, MD   0.5 mg at 08/15/16 2149  . ondansetron (ZOFRAN-ODT) disintegrating tablet 4 mg  4 mg Oral Q8H PRN Beau Fanny, FNP   4 mg at 08/16/16 0844  . QUEtiapine (SEROQUEL) tablet 300 mg  300 mg Oral QHS Craige Cotta, MD   300 mg at 08/16/16 2210  . sertraline (ZOLOFT) tablet 50 mg  50 mg Oral Daily Kristeen Mans, NP   50 mg at 08/17/16 1610    Lab Results:  Results for orders placed or performed during the hospital encounter of 08/12/16 (from the past 48 hour(s))  Valproic acid level     Status: None   Collection Time: 08/16/16  6:12 AM  Result Value Ref Range   Valproic Acid Lvl 51 50.0 - 100.0 ug/mL    Comment: Performed at [facility]    Blood Alcohol level:  Lab Results  Component Value Date   [facility] <5 08/12/2016   ETH <5 05/13/2016    Metabolic Disorder Labs: Lab Results  Component Value Date   HGBA1C 5.2 05/14/2016   MPG 103 05/14/2016   MPG 114 02/04/2016   Lab Results  Component Value Date   PROLACTIN 32.8 (H) 02/04/2016   Lab Results  Component Value Date   CHOL 135 05/14/2016   TRIG 61 05/14/2016   HDL 58 05/14/2016   CHOLHDL 2.3 05/14/2016   VLDL 12 05/14/2016   LDLCALC 65 05/14/2016   LDLCALC 69 02/04/2016    Physical Findings: AIMS: Facial and Oral Movements Muscles of Facial Expression: None, normal Lips and Perioral Area: None, normal Jaw: None, normal Tongue: None, normal,Extremity Movements Upper (arms, wrists, hands, fingers): None, normal Lower (legs, knees, ankles, toes): None, normal, Trunk Movements Neck, shoulders, hips: None, normal, Overall Severity Severity of abnormal movements (highest score from questions above): None, normal Incapacitation due to abnormal movements: None, normal Patient's awareness of abnormal movements (rate only patient's report): No Awareness, Dental Status Current problems with teeth and/or dentures?: No Does patient usually wear dentures?: No  CIWA:   CIWA-Ar Total: 1 COWS:  COWS Total Score: 2  Musculoskeletal: Strength & Muscle Tone: within normal limits Gait & Station: normal Patient leans: N/A  Psychiatric Specialty Exam: Physical Exam  Review of Systems  Constitutional: Negative.   Eyes: Negative.   Respiratory: Negative.   Cardiovascular: Negative.   Genitourinary: Negative.   Musculoskeletal: Negative.   Skin: Negative.   Neurological: Negative for tremors.  Psychiatric/Behavioral: Positive for depression and substance abuse. Negative for hallucinations and suicidal ideas. The patient is nervous/anxious. The patient does not have insomnia.   All other systems reviewed and are negative.   Blood pressure 99/64, pulse 97, temperature 98.1 F (36.7 C), temperature source Oral, resp. rate 18, height 5\' 4"  (1.626 m), weight 122 lb (55.3 kg), last menstrual  period 07/29/2016, not currently breastfeeding.Body mass index is 20.94 kg/m.  General Appearance: Casual  Eye Contact:  Good  Speech:  Normal Rate  Volume:  Decreased  Mood:  Reports she is " starting to feel better"  Affect:  More reactive   Thought Process:  Linear  Orientation:  Full (Time, Place, and Person)  Thought Content:  At this time denies ongoing hallucinations, no delusions , not internally preoccupied   Suicidal Thoughts:  No currently denies suicidal plan or intention and contracts for safety on unit   Homicidal Thoughts:  No denies   Memory:  Recent and remote grossly intact   Judgement:  Fair  Insight:  Fair  Psychomotor Activity:  Decreased   Concentration:  Concentration: Good and Attention Span: Good  Recall:  Good  Fund of Knowledge:  Good  Language:  Good  Akathisia:  No  Handed:    AIMS (if indicated):     Assets:  Communication Skills Desire for Improvement Resilience Social Support  ADL's:  Intact  Cognition:  WNL  Sleep:  Number of Hours: 5.75   Treatment Plan Summary: Bipolar affective disorder, currently depressed, moderate  (HCC)  Assessment - patient presents with partial improvement today- less depressed, feeling subjectively better, and reporting decrease in psychotic symptoms. No current SI, and more future oriented, [facility]. Tolerating medications well , does describe some nausea today, but good PO intake, no vomiting, no diarrhea.    Medications: -Continue  Seroquel  300 mg QHS for mood disorder, psychotic symptoms, and insomnia -continue  Depakote  ER   750 mgrs QHS for mood disorder  -Continue zoloft 50mg  QDAY for depressive symptoms -Continue Ativan 0.5mg  Q 6H  PRN withdrawal/anxiety  -Encourage increased group and milieu participation to work on coping skills and symptom reduction -Treatment team working on  Disposition planning options.   Nehemiah MassedOBOS, FERNANDO, MD 08/17/2016, 12:15 PM Patient ID: Caitlin Bell, female   DOB: 11/08/1990, 25 y.o.   MRN: [id number]

## 2016-08-17 NOTE — Progress Notes (Signed)
D: Pt denies SI/HI/AVH. Pt is pleasant and cooperative. Pt stated she had a good day.   A: Pt was offered support and encouragement. Pt was given scheduled medications. Pt was encourage to attend groups. Q 15 minute checks were done for safety.   R:Pt attends groups and interacts well with peers and staff. Pt is taking medication. .Pt receptive to treatment and safety maintained on unit.

## 2016-08-17 NOTE — [facility] Group Notes (Signed)
[facility] LCSW Group Therapy 08/17/2016 1:15 PM  Type of Therapy: Group Therapy- Feelings about Diagnosis  Participation Level: Active   Participation Quality:  Appropriate  Affect:  Appropriate  Cognitive: Alert and Oriented   Insight:  Developing   Engagement in Therapy: Developing/Improving and Engaged   Modes of Intervention: Clarification, Confrontation, Discussion, Education, Exploration, Limit-setting, Orientation, Problem-solving, Rapport Building, Dance movement psychotherapisteality Testing, Socialization and Support  Description of Group:   This group will allow patients to explore their thoughts and feelings about diagnoses they have received. Patients will be guided to explore their level of understanding and acceptance of these diagnoses. Facilitator will encourage patients to process their thoughts and feelings about the reactions of others to their diagnosis, and will guide patients in identifying ways to discuss their diagnosis with significant others in their lives. This group will be process-oriented, with patients participating in exploration of their own experiences as well as giving and receiving support and challenge from other group members.  Summary of Progress/Problems:  Pt was active in group discussion and sought clarification of diagnosis and symptoms, particularly the psychotic features of her bipolar disorder. Pt was receptive to feedback and offered suggestions and insights to peers who had questions or relatable experiences.   Therapeutic Modalities:   Cognitive Behavioral Therapy Solution Focused Therapy Motivational Interviewing Relapse Prevention Therapy  Vernie ShanksLauren Malyna Budney, LCSW 08/17/2016 3:21 PM

## 2016-08-18 MED ORDER — GABAPENTIN 100 MG PO CAPS
200.0000 mg | ORAL_CAPSULE | Freq: Three times a day (TID) | ORAL | Status: DC
  Administered 2016-08-18 – 2016-08-19 (×4): 200 mg via ORAL
  Filled 2016-08-18 (×4): qty 2
  Filled 2016-08-18 (×2): qty 42
  Filled 2016-08-18: qty 2
  Filled 2016-08-18: qty 42

## 2016-08-18 MED ORDER — DIVALPROEX SODIUM ER 500 MG PO TB24
500.0000 mg | ORAL_TABLET | Freq: Every day | ORAL | Status: DC
  Administered 2016-08-18: 500 mg via ORAL
  Filled 2016-08-18: qty 1
  Filled 2016-08-18 (×2): qty 7
  Filled 2016-08-18: qty 1

## 2016-08-18 NOTE — Progress Notes (Signed)
D: Pt passive SI, AVH- contracts for safety. Pt is pleasant and cooperative. Pt stated she was doing better today. Pt said she socialized more , got her interest in reading back and spent more time in the dayroom.   A: Pt was offered support and encouragement. Pt was given scheduled medications. Pt was encourage to attend groups. Q 15 minute checks were done for safety.   R:Pt attends groups and interacts well with peers and staff. Pt is taking medication. Pt has no complaints.Pt receptive to treatment and safety maintained on unit.

## 2016-08-18 NOTE — Plan of Care (Signed)
Problem: Safety: Goal: Periods of time without injury will increase Outcome: Progressing While patient does endorse passive SI, she has not engaged in self harm.  Problem: Medication: Goal: Compliance with prescribed medication regimen will improve Outcome: Progressing Patient is med compliant.

## 2016-08-18 NOTE — Progress Notes (Signed)
Recreation Therapy Notes  Date: 08/18/16 Time: 0930 Location: 300 [address] Dayroom  Group Topic: Stress Management  Goal Area(s) Addresses:  Patient will verbalize importance of using healthy stress management.  Patient will identify positive emotions associated with healthy stress management.   Intervention: Calm App  Activity :  Resilience Meditation.  LRT introduced the stress management technique of meditation.  LRT played a meditation to help patients improve resilience.  Patients were to follow along with the recording to the best of their ability to engaged in the technique.  Education:  Stress Management, Discharge Planning.   Education Outcome: Acknowledges edcuation/In group clarification offered/Needs additional education  Clinical Observations/Feedback: Pt did not attend group.    Caroll RancherMarjette Sharrieff Spratlin, LRT/CTRS         Caroll RancherLindsay, Mayara Paulson A 08/18/2016 12:04 PM

## 2016-08-18 NOTE — Progress Notes (Signed)
[facility]  08/18/2016 1:51 PM Caitlin Bell  MRN:  [id number] Subjective:  Patient reports partial improvement compared to admission and in particular reports improving interest, decreased anhedonia, and feeling less isolative . " I am interested in talking to other people again" she states . She reports some ongoing nausea, but no vomiting .  Objective: Pt seen and chart reviewed.  Case discussed with treatment team. Patient presenting with gradually improving mood and a more reactive affect. As she improves team/patient are focusing more on disposition options.  Reports some ongoing anxiety, depression, but acknowledges these have decreased since admission. Nausea started recently and appears to be temporally related to Depakote ER titration . Of note, no vomiting, no diarrhea, and good PO intake.  Patient is future oriented and more focused on potential disposition plans - she is interested in [facility], but states she feels ambivalent about this, mainly as her children are in [place] ( with grandmother) . She states, however, that one of her goals is to focus on herself at this time, states " I need to get better so I can have more of a part in their lives ". No disruptive or agitated behaviors on unit, visible on unit. Going to groups, more interactive with peers.   Principal Problem: Bipolar affective disorder, currently depressed, moderate (HCC) Diagnosis:   Patient Active Problem List   Diagnosis Date Noted  . Bipolar affective disorder, currently depressed, moderate (HCC) [F31.32] 08/14/2016  . Cholestasis of pregnancy [O26.619, K83.1] 10/31/2014  . H/O anorexia nervosa [Z86.59] 10/31/2014  . H/O bulimia nervosa [Z86.59] 10/31/2014  . Gestational diabetes [O24.419] 10/31/2014  . Umbilical hernia [K42.9] 10/31/2014  . Hx of preterm delivery  [O09.219] 10/31/2014  . Generalized anxiety disorder [F41.1] 10/31/2014  . History of marijuana use [Z87.898]  10/31/2014  . Cerebral palsy (HCC) [G80.9] 10/31/2014  . Hx of PTL (preterm labor), current pregnancy [O09.219] 10/30/2014   Total Time spent with patient: 20 minutes   Past Psychiatric History: See H&P  Past Medical History:  Past Medical History:  Diagnosis Date  . Anxiety   . Bipolar disorder (HCC)   . 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 Depression   . Gestational diabetes     Past Surgical History:  Procedure Laterality Date  . CHOLECYSTECTOMY  2014  . HERNIA REPAIR     Family History: History reviewed. No pertinent family history. Family Psychiatric  History: See H&P Social History:  History  Alcohol Use  . 2.4 oz/week  . 4 Shots of liquor per week    Comment: 2 days; weekly use     History  Drug Use  . Types: Cocaine    Comment: cocaine Sunday    Social History   Social History  . Marital status: Single    Spouse name: N/A  . Number of children: N/A  . Years of education: N/A   Social History Main Topics  . Smoking status: Former Smoker    Quit date: 10/31/2011  . Smokeless tobacco: Never Used  . Alcohol use 2.4 oz/week    4 Shots of liquor per week     Comment: 2 days; weekly use  . Drug use:     Types: Cocaine     Comment: cocaine Sunday  . Sexual activity: Yes    Birth control/ protection: None   Other Topics Concern  . None   Social History Narrative  . None   Additional Social History:  Sleep:   Improving   Appetite:  Good  Current Medications: Current Facility-Administered Medications  Medication Dose Route Frequency Provider Last Rate Last Dose  . acetaminophen (TYLENOL) tablet 650 mg  650 mg Oral Q6H PRN Kristeen Mans, NP      . divalproex (DEPAKOTE ER) 24 hr tablet 500 mg  500 mg Oral QHS Arabelle Bollig A Fatime Biswell, Caitlin Bell      . gabapentin (NEURONTIN) capsule 200 mg  200 mg Oral TID Craige Cotta, Caitlin Bell   200 mg at 08/18/16 1128  . ibuprofen (ADVIL,MOTRIN) tablet 400 mg  400 mg Oral Q6H PRN Jackelyn Poling, NP   400 mg at  08/16/16 1633  . LORazepam (ATIVAN) tablet 0.5 mg  0.5 mg Oral Q6H PRN Craige Cotta, Caitlin Bell   0.5 mg at 08/18/16 1314  . ondansetron (ZOFRAN-ODT) disintegrating tablet 4 mg  4 mg Oral Q8H PRN Beau Fanny, FNP   4 mg at 08/18/16 0834  . QUEtiapine (SEROQUEL) tablet 300 mg  300 mg Oral QHS Craige Cotta, Caitlin Bell   300 mg at 08/17/16 2137  . sertraline (ZOLOFT) tablet 50 mg  50 mg Oral Daily Kristeen Mans, NP   50 mg at 08/18/16 1128    Lab Results:  No results found for this or any previous visit (from the past 48 hour(s)).  Blood Alcohol level:  Lab Results  Component Value Date   ETH <5 08/12/2016   ETH <5 05/13/2016    Metabolic Disorder Labs: Lab Results  Component Value Date   HGBA1C 5.2 05/14/2016   MPG 103 05/14/2016   MPG 114 02/04/2016   Lab Results  Component Value Date   PROLACTIN 32.8 (H) 02/04/2016   Lab Results  Component Value Date   CHOL 135 05/14/2016   TRIG 61 05/14/2016   HDL 58 05/14/2016   CHOLHDL 2.3 05/14/2016   VLDL 12 05/14/2016   LDLCALC 65 05/14/2016   LDLCALC 69 02/04/2016    Physical Findings: AIMS: Facial and Oral Movements Muscles of Facial Expression: None, normal Lips and Perioral Area: None, normal Jaw: None, normal Tongue: None, normal,Extremity Movements Upper (arms, wrists, hands, fingers): None, normal Lower (legs, knees, ankles, toes): None, normal, Trunk Movements Neck, shoulders, hips: None, normal, Overall Severity Severity of abnormal movements (highest score from questions above): None, normal Incapacitation due to abnormal movements: None, normal Patient's awareness of abnormal movements (rate only patient's report): No Awareness, Dental Status Current problems with teeth and/or dentures?: No Does patient usually wear dentures?: No  CIWA:  CIWA-Ar Total: 1 COWS:  COWS Total Score: 1  Musculoskeletal: Strength & Muscle Tone: within normal limits Gait & Station: normal Patient leans: N/A  Psychiatric Specialty  Exam: Physical Exam  Review of Systems  Constitutional: Negative.   Eyes: Negative.   Respiratory: Negative.   Cardiovascular: Negative.   Genitourinary: Negative.   Musculoskeletal: Negative.   Skin: Negative.   Neurological: Negative for tremors.  Psychiatric/Behavioral: Positive for depression and substance abuse. Negative for hallucinations and suicidal ideas. The patient is nervous/anxious. The patient does not have insomnia.   All other systems reviewed and are negative. reports nausea, no vomiting   Blood pressure (!) 98/50, pulse (!) 108, temperature 98 F (36.7 C), temperature source Oral, resp. rate 16, height 5\' 4"  (1.626 m), weight 122 lb (55.3 kg), last menstrual period 07/29/2016, not currently breastfeeding.Body mass index is 20.94 kg/m.  General Appearance:  Grooming has improved   Eye Contact:  Good  Speech:  Normal Rate  Volume:  Decreased  Mood: improving  compared to admission   Affect:  More reactive, smiles at times appropriately, mildly anxious  Thought Process:  Linear  Orientation:  Full (Time, Place, and Person)  Thought Content:  At this time denies ongoing hallucinations, no delusions , not internally preoccupied   Suicidal Thoughts:  No currently denies suicidal plan or intention and contracts for safety on unit   Homicidal Thoughts:  No denies   Memory:  Recent and remote grossly intact   Judgement:  Fair  Insight:  Fair  Psychomotor Activity:  Decreased   Concentration:  Concentration: Good and Attention Span: Good  Recall:  Good  Fund of Knowledge:  Good  Language:  Good  Akathisia:  No  Handed:    AIMS (if indicated):     Assets:  Communication Skills Desire for Improvement Resilience Social Support  ADL's:  Intact  Cognition:  WNL  Sleep:  Number of Hours: 5   Treatment Plan Summary: Bipolar affective disorder, currently depressed, moderate (HCC)  Assessment - patient presents with improvement of mood , affect, and behavior is calm  and in good control. She remains anxious and in particular is ruminative about disposition options .Denies suicidal ideations. No current psychotic symptoms reported or noted. She is interested in going to [facility] on discharge, but is somewhat hesitant to move out of [place] as would be less able to see her children- now living with her mother- regularly.  She has developed nausea over the last 1-2 days, but no vomiting and presents calm. She attributes this to Depakote ER titration- states that during a prior Depakote ER trial also caused nausea     Medications: -Continue  Seroquel  300 mg QHS for mood disorder, psychotic symptoms, and insomnia -Decrease  Depakote  ER  To 500  mgrs QHS for mood disorder - rationale is to decrease dose due to reported side effects ( nausea) emerging  after recent titration.  -Continue Zoloft  50mg  QDAY for depressive symptoms -Continue Ativan 0.5mg  Q 6H  PRN withdrawal/anxiety  -Encourage increased group and milieu participation to work on coping skills and symptom reduction -Treatment team working on  Disposition planning options.   Caitlin Bell, Caitlin Wilemon, Caitlin Bell 08/18/2016, 1:51 PM Patient ID: Caitlin Bell, female   DOB: 08/31/1991, 25 y.o.   MRN: [id number] Patient ID: Caitlin Bell, female   DOB: 03/27/1991, 25 y.o.   MRN: [id number]

## 2016-08-18 NOTE — Progress Notes (Signed)
D: Patient resting in bed this AM due to nausea, possibly due to med changes yesterday. Spoke with patient 1:1. Rates sleep as fair, appetite as fair, energy as normal and concentration as good. Patient's affect anxious and sad with congruent mood. Rating depression at a 5/10, hopelessness at a 5/10 and anxiety at a 5/10. States goal for today is to "discharge and talk to the doctor." Denies pain.   A: Medicated per orders, prn zofran given. Emotional support offered and self inventory reviewed. Encouraged completion of Suicide Safety Plan. Discussed POC with MD, SW.    R: Patient verbalizes understanding of POC. On reassess, patient reports decreased nausea. Was able to take AM meds late morning. Patient denying SI today. No HI. Does endorse AVH however reports they are lessening. Patient remains safe on level III obs.

## 2016-08-18 NOTE — Progress Notes (Signed)
CSW spoke with Pt's DSS caseworker, Betsey AmenDanielle B [phone], who called to check on Pt's progress. CSW informed Duwayne HeckDanielle that Pt would likely DC on Friday and CSW expressed that it would be important to help Pt get connected with a resource that could help her get her medications. CSW informed Pt that Duwayne HeckDanielle had called and told Pt that she could call Duwayne HeckDanielle if she needs anything.     Vernie ShanksLauren Rayn Shorb, LCSW Clinical Social Work [phone]

## 2016-08-18 NOTE — [facility] Group Notes (Signed)
[facility] LCSW Group Therapy 08/18/2016 1:15 PM  Type of Therapy: Group Therapy- Emotion Regulation  Participation Level: Active   Participation Quality:  Appropriate  Affect: Appropriate  Cognitive: Alert and Oriented   Insight:  Developing/Improving  Engagement in Therapy: Developing/Improving and Engaged   Modes of Intervention: Clarification, Confrontation, Discussion, Education, Exploration, Limit-setting, Orientation, Problem-solving, Rapport Building, Dance movement psychotherapisteality Testing, Socialization and Support  Summary of Progress/Problems: The topic for group today was emotional regulation. This group focused on both positive and negative emotion identification and allowed group members to process ways to identify feelings, regulate negative emotions, and find healthy ways to manage internal/external emotions. Group members were asked to reflect on a time when their reaction to an emotion led to a negative outcome and explored how alternative responses using emotion regulation would have benefited them. Group members were also asked to discuss a time when emotion regulation was utilized when a negative emotion was experienced.  Pt participated appropriately in group discussion and interacted well with peers. She expressed that loneliness and feeling like a disappointment are difficult emotions for her to regulate.    Vernie ShanksLauren Davarious Tumbleson, LCSW 08/18/2016 3:04 PM

## 2016-08-18 NOTE — Progress Notes (Signed)
Adult Psychoeducational Group Note  Date:  08/18/2016 Time:  9:43 PM  Group Topic/Focus:  Wrap-Up Group:   The focus of this group is to help patients review their daily goal of treatment and discuss progress on daily workbooks.   Participation Level:  Active  Participation Quality:  Appropriate  Affect:  Appropriate  Cognitive:  Alert, Appropriate and Oriented  Insight: Appropriate  Engagement in Group:  Engaged  Modes of Intervention:  Discussion  Additional Comments:  Patient attended warp-up group and said that her day was a 10.  She had a good day and she use gories game as Pharmacologistcoping skills.   Tais Koestner W Afia Messenger 08/18/2016, 9:43 PM

## 2016-08-19 MED ORDER — QUETIAPINE FUMARATE 300 MG PO TABS: 300.0000 mg | ORAL_TABLET | Freq: Every day | ORAL | 0 refills | Status: AC

## 2016-08-19 MED ORDER — DIVALPROEX SODIUM ER 500 MG PO TB24: 500.0000 mg | ORAL_TABLET | Freq: Every day | ORAL | 0 refills | Status: AC

## 2016-08-19 MED ORDER — GABAPENTIN 100 MG PO CAPS: 200.0000 mg | ORAL_CAPSULE | Freq: Three times a day (TID) | ORAL | 0 refills | Status: AC

## 2016-08-19 MED ORDER — HYDROXYZINE HCL 25 MG PO TABS: 25.0000 mg | ORAL_TABLET | Freq: Three times a day (TID) | ORAL | 0 refills | Status: AC | PRN

## 2016-08-19 MED ORDER — HYDROXYZINE HCL 25 MG PO TABS
25.0000 mg | ORAL_TABLET | Freq: Three times a day (TID) | ORAL | Status: DC | PRN
  Filled 2016-08-19: qty 10

## 2016-08-19 MED ORDER — SERTRALINE HCL 50 MG PO TABS: 50.0000 mg | ORAL_TABLET | Freq: Every day | ORAL | 0 refills | Status: AC

## 2016-08-19 NOTE — Progress Notes (Signed)
Patient ID: Caitlin Bell, female   DOB: 04/03/1991, 25 y.o.   MRN: [id number]  Pt. Denies SI/HI and A/V hallucinations. Belongings returned to patient at time of discharge. Patient denies any pain or discomfort. Discharge instructions and medications were reviewed with patient. Patient verbalized understanding of both medications and discharge instructions. Patient discharged to lobby where her friend was waiting. Q15 minute safety checks maintained until discharge. No distress upon discharge.

## 2016-08-19 NOTE — Discharge Summary (Addendum)
Physician Discharge Summary Note  Patient:  Caitlin Bell is an 25 y.o., female MRN:  [id number] DOB:  [birth year] Patient phone:  [phone] (home)  Patient address:   [address] [place] [address],  Total Time spent with patient: 30 minutes  Date of Admission:  08/12/2016 Date of Discharge: 08/19/2016  Reason for Admission:  Worsening depression  Principal Problem: Bipolar affective disorder, currently depressed, moderate (HCC) Discharge Diagnoses: Patient Active Problem List   Diagnosis Date Noted  . Bipolar affective disorder, currently depressed, moderate (HCC) [F31.32] 08/14/2016  . Bipolar I disorder, most recent episode depressed (HCC) [F31.30] 03/01/2016  . Cholestasis of pregnancy [O26.619, K83.1] 10/31/2014  . H/O anorexia nervosa [Z86.59] 10/31/2014  . H/O bulimia nervosa [Z86.59] 10/31/2014  . Gestational diabetes [O24.419] 10/31/2014  . Umbilical hernia [K42.9] 10/31/2014  . Hx of preterm delivery  [O09.219] 10/31/2014  . Generalized anxiety disorder [F41.1] 10/31/2014  . History of marijuana use [Z87.898] 10/31/2014  . Cerebral palsy (HCC) [G80.9] 10/31/2014  . Hx of PTL (preterm labor), current pregnancy [O09.219] 10/30/2014    Past Psychiatric History:  See HPI  Past Medical History:  Past Medical History:  Diagnosis Date  . Anxiety   . Bipolar disorder (HCC)   . 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 Depression   . Gestational diabetes     Past Surgical History:  Procedure Laterality Date  . CHOLECYSTECTOMY  2014  . HERNIA REPAIR     Family History: History reviewed. No pertinent family history. Family Psychiatric  History:  See HPI Social History:  History  Alcohol Use  . 2.4 oz/week  . 4 Shots of liquor per week    Comment: 2 days; weekly use     History  Drug Use  . Types: Cocaine    Comment: cocaine Sunday    Social History   Social History  . Marital status: Single    Spouse name: N/A  . Number of children:  N/A  . Years of education: N/A   Social History Main Topics  . Smoking status: Former Smoker    Quit date: 10/31/2011  . Smokeless tobacco: Never Used  . Alcohol use 2.4 oz/week    4 Shots of liquor per week     Comment: 2 days; weekly use  . Drug use:     Types: Cocaine     Comment: cocaine Sunday  . Sexual activity: Yes    Birth control/ protection: None   Other Topics Concern  . None   Social History Narrative  . None    Hospital Course:  Caitlin Bell, 25 year old single female, currently homeless, most recently living in a friend's car was admitted for worsening depression.  Also developed auditory hallucinations. Patient is well known to the [facility].    Caitlin Bell was admitted for Bipolar affective disorder, currently depressed, moderate (HCC) and crisis management.  Patient was treated with medications with their indications listed below in detail under Medication List.  Medical problems were identified and treated as needed.  Home medications were restarted as appropriate.  Improvement was monitored by observation and Caitlin Bell daily report of symptom reduction.  Emotional and mental status was monitored by daily self inventory reports completed by Caitlin Bell and clinical staff.  Patient reported continued improvement, denied any new concerns.  Patient had been compliant on medications and denied side effects.  Support and encouragement was provided.    Patient encouraged to attend groups  to help with recognizing triggers of emotional crises and de-stabilizations.  Patient encouraged to attend group to help identify the positive things in life that would help in dealing with feelings of loss, depression and unhealthy or abusive tendencies.         Caitlin RudVictoria L Bell was evaluated by the treatment team for stability and plans for continued recovery upon discharge.  Patient was offered further treatment options upon discharge including Residential,  Intensive Outpatient and Outpatient treatment. Patient will follow up with agency listed below for medication management and counseling.  Encouraged patient to maintain satisfactory support network and home environment.  Advised to adhere to medication compliance and outpatient treatment follow up.  Prescriptions provided.       Caitlin RudVictoria L Bell motivation was an integral factor for scheduling further treatment.  Employment, transportation, bed availability, health status, family support, and any pending legal issues were also considered during patient's hospital stay.  Upon completion of this admission the patient was both mentally and medically stable for discharge denying suicidal/homicidal ideation, auditory/visual/tactile hallucinations, delusional thoughts and paranoia.      Physical Findings: AIMS: Facial and Oral Movements Muscles of Facial Expression: None, normal Lips and Perioral Area: None, normal Jaw: None, normal Tongue: None, normal,Extremity Movements Upper (arms, wrists, hands, fingers): None, normal Lower (legs, knees, ankles, toes): None, normal, Trunk Movements Neck, shoulders, hips: None, normal, Overall Severity Severity of abnormal movements (highest score from questions above): None, normal Incapacitation due to abnormal movements: None, normal Patient's awareness of abnormal movements (rate only patient's report): No Awareness, Dental Status Current problems with teeth and/or dentures?: No Does patient usually wear dentures?: No  CIWA:  CIWA-Ar Total: 1 COWS:  COWS Total Score: 1  Musculoskeletal: Strength & Muscle Tone: within normal limits Gait & Station: normal Patient leans: N/A  Psychiatric Specialty Exam:  SEE MD SRA Physical Exam  Nursing note and vitals reviewed.   ROS  Blood pressure 120/73, pulse 80, temperature 98.3 F (36.8 C), temperature source Oral, resp. rate 16, height 5\' 4"  (1.626 m), weight 55.3 kg (122 lb), last menstrual period  07/29/2016, not currently breastfeeding.Body mass index is 20.94 kg/m.   Have you used any form of tobacco in the last 30 days? (Cigarettes, Smokeless Tobacco, Cigars, and/or Pipes): No  Has this patient used any form of tobacco in the last 30 days? (Cigarettes, Smokeless Tobacco, Cigars, and/or Pipes) Yes, N/A  Blood Alcohol level:  Lab Results  Component Value Date   [facility] 08/12/2016   ETH <5 05/13/2016    Metabolic Disorder Labs:  Lab Results  Component Value Date   HGBA1C 5.2 05/14/2016   MPG 103 05/14/2016   MPG 114 02/04/2016   Lab Results  Component Value Date   PROLACTIN 32.8 (H) 02/04/2016   Lab Results  Component Value Date   CHOL 135 05/14/2016   TRIG 61 05/14/2016   HDL 58 05/14/2016   CHOLHDL 2.3 05/14/2016   VLDL 12 05/14/2016   LDLCALC 65 05/14/2016   LDLCALC 69 02/04/2016    See Psychiatric Specialty Exam and Suicide Risk Assessment completed by Attending Physician prior to discharge.  Discharge destination:  Home  Is patient on multiple antipsychotic therapies at discharge:  No   Has Patient had three or more failed trials of antipsychotic monotherapy by history:  No  Recommended Plan for Multiple Antipsychotic Therapies: NA     Medication List    STOP taking these medications   acetaminophen 500 MG tablet Commonly known as:  TYLENOL   clonazePAM 0.5 MG tablet Commonly known as:  KLONOPIN     TAKE these medications     Indication  divalproex 500 MG 24 hr tablet Commonly known as:  DEPAKOTE ER Take 1 tablet (500 mg total) by mouth at bedtime. What changed:  additional instructions  Indication:  mood stabilization   gabapentin 100 MG capsule Commonly known as:  NEURONTIN Take 2 capsules (200 mg total) by mouth 3 (three) times daily. What changed:  medication strength  how much to take  additional instructions  Indication:  Agitation   hydrOXYzine 25 MG tablet Commonly known as:  ATARAX/VISTARIL Take 1 tablet (25 mg total)  by mouth 3 (three) times daily as needed for anxiety.  Indication:  Anxiety Neurosis   QUEtiapine 300 MG tablet Commonly known as:  SEROQUEL Take 1 tablet (300 mg total) by mouth at bedtime. What changed:  medication strength  how much to take  additional instructions  Indication:  mood stabilization   sertraline 50 MG tablet Commonly known as:  ZOLOFT Take 1 tablet (50 mg total) by mouth daily. Start taking on:  08/20/2016 What changed:  additional instructions  Indication:  Major Depressive Disorder      Follow-up Information    [facility] Follow up.   Specialty:  Behavioral Health Why:  Please go within 1-3 days of discharge to be evaluated for medication management and therapy services. Walk-in hours are from 8am-3pm Monday-Friday. Please tell staff that you are there for a hospital DC appt Contact information: [address] [place] [address] [phone]           Follow-up recommendations:  Activity:  as tol Diet:  as tol  Comments:  1.  Take all your medications as prescribed.   2.  Report any adverse side effects to outpatient provider. 3.  Patient instructed to not use alcohol or illegal drugs while on prescription medicines. 4.  In the event of worsening symptoms, instructed patient to call 911, the crisis hotline or go to nearest emergency room for evaluation of symptoms.  Signed: Lindwood QuaSheila May Agustin, NP [facility] 08/19/2016, 4:22 PM   Patient seen, Suicide Assessment Completed.  Disposition Plan Reviewed

## 2016-08-19 NOTE — Progress Notes (Addendum)
D: Passive SI/ [facility]- contracts for safety . Pt is pleasant and cooperative. Pt stated she was doing better .   A: Pt was offered support and encouragement. Pt was given scheduled medications. Pt was encourage to attend groups. Q 15 minute checks were done for safety.   R:Pt attends groups and interacts well with peers and staff. Pt is taking medication. Pt has no complaints.Pt receptive to treatment and safety maintained on unit.

## 2016-08-19 NOTE — [facility] Suicide Risk Assessment (Addendum)
[facility]   Principal Problem: Bipolar affective disorder, currently depressed, moderate (HCC) Discharge Diagnoses:  Patient Active Problem List   Diagnosis Date Noted  . Bipolar affective disorder, currently depressed, moderate (HCC) [F31.32] 08/14/2016  . Cholestasis of pregnancy [O26.619, K83.1] 10/31/2014  . H/O anorexia nervosa [Z86.59] 10/31/2014  . H/O bulimia nervosa [Z86.59] 10/31/2014  . Gestational diabetes [O24.419] 10/31/2014  . Umbilical hernia [K42.9] 10/31/2014  . Hx of preterm delivery  [O09.219] 10/31/2014  . Generalized anxiety disorder [F41.1] 10/31/2014  . History of marijuana use [Z87.898] 10/31/2014  . Cerebral palsy (HCC) [G80.9] 10/31/2014  . Hx of PTL (preterm labor), current pregnancy [O09.219] 10/30/2014    Total Time spent with patient: 30 minutes  Musculoskeletal: Strength & Muscle Tone: within normal limits Gait & Station: normal Patient leans: N/A  Psychiatric Specialty Exam: ROS reports her nausea has subsided, denies vomiting, no diarrhea.  Blood pressure 120/73, pulse 80, temperature 98.3 F (36.8 C), temperature source Oral, resp. rate 16, height 5\' 4"  (1.626 m), weight 122 lb (55.3 kg), last menstrual period 07/29/2016, not currently breastfeeding.Body mass index is 20.94 kg/m.  General Appearance: improved grooming   Eye Contact::  Good  Speech:  Normal Rate409  Volume:  Normal  Mood:  improved mood, states she feels "OK" today  Affect:  Appropriate and more reactive   Thought Process:  Linear  Orientation:  Full (Time, Place, and Person)  Thought Content:  no hallucinations, no delusions, not internally preoccupied   Suicidal Thoughts:  No denies any suicidal or self injurious ideations, denies any homicidal ideations   Homicidal Thoughts:  No  Memory:  recent and remote grossly intact   Judgement:  Other:  improving   Insight:  Fair  Psychomotor Activity:  Normal  Concentration:  Good  Recall:  Good   Fund of Knowledge:Good  Language: Good  Akathisia:  Negative  Handed:  Right  AIMS (if indicated):     Assets:  Desire for Improvement Resilience  Sleep:  Number of Hours: 4.5  Cognition: WNL  ADL's:  Intact   Mental Status Per Nursing Assessment::   On Admission:     Demographic Factors:  25 year old single female, unemployed, homeless, patient's mother has custody of her children   Loss Factors: Homelessness, unemployment, limited support network, not having custody of her children  Historical Factors: History of depression, history of prior psychiatric admissions,  History of self cutting, denies prior history of suicide attempts  Risk Reduction Factors:   Sense of responsibility to family and Positive coping skills or problem solving skills  Continued Clinical Symptoms:  At this time patient reports feeling significantly better, and states her mood has improved, minimizes depression at this time. Affect is more reactive and today smiles often and appropriately, no thought disorder, no suicidal or self injurious ideations, no homicidal or violent ideations, no hallucinations, no delusions, future oriented, 0x3.  States she feels medications are working well for her at this time and denies side effects. Reports that a reason she is feeling better today is that her boyfriend has gotten an extended stay hotel room " for the next month or two". States boyfriend is supportive . No disruptive or agitated behaviors on unit, interactive with peers .  Cognitive Features That Contribute To Risk:  No gross cognitive deficits noted upon discharge. Is alert , attentive, and oriented x 3    Suicide Risk:  Mild:  Suicidal ideation of limited frequency, intensity, duration, and specificity.  There are no identifiable plans, no associated intent, mild dysphoria and related symptoms, good self-control (both objective and subjective assessment), few other risk factors, and identifiable  protective factors, including available and accessible social support.  Follow-up Information    [facility] Follow up.   Specialty:  Behavioral Health Why:  Please go within 1-3 days of discharge to be evaluated for medication management and therapy services. Walk-in hours are from 8am-3pm Monday-Friday. Please tell staff that you are there for a hospital DC appt Contact information: [address] [place] [address] [phone]           Plan Of Care/Follow-up recommendations:  Activity:  as tolerated  Diet:  Regular Tests:  NA Other:  See below  Patient is leaving in good spirits. As above, states she is going to live in a hotel with her boyfriend Plans to follow up as above   Nehemiah MassedOBOS, Evetta Renner, MD 08/19/2016, 11:57 AM

## 2016-08-19 NOTE — Progress Notes (Signed)
  [facility]  Will you be returning to the same living situation after discharge:  No. Pt plans to stay with a friend in [place] At discharge, do you have transportation home?: Yes,  Pt friend to pick up Do you have the ability to pay for your medications: Yes,  Pt provided with samples and prescriptions  Release of information consent forms completed and in the chart;  Patient's signature needed at discharge.  Patient to Follow up at: Follow-up Information    [facility] Follow up.   Specialty:  Behavioral Health Why:  Please go within 1-3 days of discharge to be evaluated for medication management and therapy services. Walk-in hours are from 8am-3pm Monday-Friday. Please tell staff that you are there for a hospital DC appt Contact information: [address] [place] [address] [phone]           Next level of care provider has [facility] Link:no  Safety Planning and Suicide Prevention discussed: Yes,  with Pt; declines SPE with family  Have you used any form of tobacco in the last 30 days? (Cigarettes, Smokeless Tobacco, Cigars, and/or Pipes): No  Has patient been referred to the Quitline?: N/A patient is not a smoker  Patient has been referred for addiction treatment: Yes  Verdene LennertLauren C Jacere Pangborn 08/19/2016, 11:01 AM

## 2016-08-19 NOTE — B[facility] Suicide Risk Assessment (Signed)
[facility] INPATIENT:  Family/Significant Other Suicide Prevention Education  Suicide Prevention Education:  Patient Refusal for Family/Significant Other Suicide Prevention Education: The patient Caitlin Bell has refused to provide written consent for family/significant other to be provided Family/Significant Other Suicide Prevention Education during admission and/or prior to discharge.  Physician notified.  Verdene LennertLauren C Latasha Puskas 08/19/2016, 10:56 AM

## 2016-08-19 NOTE — Progress Notes (Addendum)
Chaplain attempted to see patient while rounding on unit.  Following spiritual care consult open on patient.    Caitlin Bell was sleeping.  Chaplain attempted to wake pt, who was not easily roused.   Chaplain will follow up and attempt to make contact for support.     [name], Archana Eckman Wayne MDiv

## 2016-08-19 NOTE — Tx Team (Signed)
Interdisciplinary Treatment and Diagnostic Plan Update  08/19/2016 Time of Session: 10:59 AM  Caitlin Bell MRN: [id number]  Principal Diagnosis: Bipolar Disorder, Depressed   Secondary Diagnoses: Principal Problem:   Bipolar affective disorder, currently depressed, moderate (HCC)   Current Medications:  Current Facility-Administered Medications  Medication Dose Route Frequency Provider Last Rate Last Dose  . acetaminophen (TYLENOL) tablet 650 mg  650 mg Oral Q6H PRN Kristeen MansFran E Hobson, NP      . divalproex (DEPAKOTE ER) 24 hr tablet 500 mg  500 mg Oral QHS Craige CottaFernando A Cobos, MD   500 mg at 08/18/16 2140  . gabapentin (NEURONTIN) capsule 200 mg  200 mg Oral TID Craige CottaFernando A Cobos, MD   200 mg at 08/19/16 0845  . ibuprofen (ADVIL,MOTRIN) tablet 400 mg  400 mg Oral Q6H PRN Jackelyn PolingJason A Berry, NP   400 mg at 08/16/16 1633  . LORazepam (ATIVAN) tablet 0.5 mg  0.5 mg Oral Q6H PRN Craige CottaFernando A Cobos, MD   0.5 mg at 08/18/16 1314  . ondansetron (ZOFRAN-ODT) disintegrating tablet 4 mg  4 mg Oral Q8H PRN Beau FannyJohn C Withrow, FNP   4 mg at 08/18/16 0834  . QUEtiapine (SEROQUEL) tablet 300 mg  300 mg Oral QHS Craige CottaFernando A Cobos, MD   300 mg at 08/18/16 2140  . sertraline (ZOLOFT) tablet 50 mg  50 mg Oral Daily Kristeen MansFran E Hobson, NP   50 mg at 08/19/16 0845    PTA Medications: Prescriptions Prior to Admission  Medication Sig Dispense Refill Last Dose  . acetaminophen (TYLENOL) 500 MG tablet Take 1,000 mg by mouth every 6 (six) hours as needed for mild pain or headache.   08/11/2016  . clonazePAM (KLONOPIN) 0.5 MG tablet Take 0.5 mg by mouth 2 (two) times daily as needed for anxiety.   2 months ago  . divalproex (DEPAKOTE ER) 500 MG 24 hr tablet Take 1 tablet (500 mg total) by mouth at bedtime. For mood stabilization 30 tablet 0 2 months ago  . gabapentin (NEURONTIN) 300 MG capsule Take 2 capsules (600 mg total) by mouth 3 (three) times daily. Anxiety control 180 capsule 0 2 months ago  . QUEtiapine (SEROQUEL) 200 MG  tablet Take 1 tablet (200 mg total) by mouth at bedtime. MOOD CONTROL AND INSOMNIA 30 tablet 0 2 months ago  . sertraline (ZOLOFT) 50 MG tablet Take 1 tablet (50 mg total) by mouth daily. depression 30 tablet 0 2 months ago    Treatment Modalities: Medication Management, Group therapy, Case management,  1 to 1 session with clinician, Psychoeducation, Recreational therapy.  Patient Stressors: Financial difficulties Medication change or noncompliance  Patient Strengths: Ability for insight Active sense of humor Communication skills General fund of knowledge Motivation for treatment/growth  Physician Treatment Plan for Primary Diagnosis: Bipolar Disorder, Depressed  Long Term Goal(s): Improvement in symptoms so as ready for discharge  Short Term Goals: Ability to verbalize feelings will improve Ability to disclose and discuss suicidal ideas Ability to demonstrate self-control will improve Ability to identify triggers associated with substance abuse/mental health issues will improve Ability to verbalize feelings will improve Ability to disclose and discuss suicidal ideas Ability to demonstrate self-control will improve Ability to identify triggers associated with substance abuse/mental health issues will improve  Medication Management: Evaluate patient's response, side effects, and tolerance of medication regimen.  Therapeutic Interventions: 1 to 1 sessions, Unit Group sessions and Medication administration.  Evaluation of Outcomes: Adequate for Discharge  Physician Treatment Plan for Secondary Diagnosis: Principal Problem:  Bipolar affective disorder, currently depressed, moderate (HCC)   Long Term Goal(s): Improvement in symptoms so as ready for discharge  Short Term Goals: Ability to verbalize feelings will improve Ability to disclose and discuss suicidal ideas Ability to demonstrate self-control will improve Ability to identify triggers associated with substance  abuse/mental health issues will improve Ability to verbalize feelings will improve Ability to disclose and discuss suicidal ideas Ability to demonstrate self-control will improve Ability to identify triggers associated with substance abuse/mental health issues will improve  Medication Management: Evaluate patient's response, side effects, and tolerance of medication regimen.  Therapeutic Interventions: 1 to 1 sessions, Unit Group sessions and Medication administration.  Evaluation of Outcomes: Adequate for Discharge   RN Treatment Plan for Primary Diagnosis: Bipolar Disorder, Depressed  Long Term Goal(s): Knowledge of disease and therapeutic regimen to maintain health will improve  Short Term Goals: Ability to verbalize feelings will improve, Ability to disclose and discuss suicidal ideas and Ability to identify and develop effective coping behaviors will improve  Medication Management: RN will administer medications as ordered by provider, will assess and evaluate patient's response and provide education to patient for prescribed medication. RN will report any adverse and/or side effects to prescribing provider.  Therapeutic Interventions: 1 on 1 counseling sessions, Psychoeducation, Medication administration, Evaluate responses to treatment, Monitor vital signs and CBGs as ordered, Perform/monitor CIWA, COWS, AIMS and Fall Risk screenings as ordered, Perform wound care treatments as ordered.  Evaluation of Outcomes: Adequate for Discharge   LCSW Treatment Plan for Primary Diagnosis: Bipolar Disorder, Depressed  Long Term Goal(s): Safe transition to appropriate next level of care at discharge, Engage patient in therapeutic group addressing interpersonal concerns.  Short Term Goals: Engage patient in aftercare planning with referrals and resources, Increase emotional regulation and Increase skills for wellness and recovery  Therapeutic Interventions: Assess for all discharge needs, 1 to  1 time with Social worker, Explore available resources and support systems, Assess for adequacy in community support network, Educate family and significant other(s) on suicide prevention, Complete Psychosocial Assessment, Interpersonal group therapy.  Evaluation of Outcomes: Adequate for Discharge   Progress in Treatment: Attending groups: Yes Participating in groups: Yes Taking medication as prescribed: Yes, MD continues to assess for medication changes as needed Toleration medication: Yes, no side effects reported at this time Family/Significant other contact made: No, Pt declines Patient understands diagnosis: Continuing to assess Discussing patient identified problems/goals with staff: Yes Medical problems stabilized or resolved: Yes Denies suicidal/homicidal ideation: Yes Issues/concerns per patient self-inventory: None Other: N/A  New problem(s) identified: None identified at this time.   New Short Term/Long Term Goal(s): None identified at this time.   Discharge Plan or Barriers: CSW will assess for appropriate discharge plan and relevant barriers.   08/19/2016: Pt plans to stay in [place] with a friend and follow-up with outpatient services.  Reason for Continuation of Hospitalization: None identified at this time.   Estimated Length of Stay: 0 days  Attendees: Patient: 08/19/2016  10:59 AM  Physician: Dr. Jama Flavorsobos 08/19/2016  10:59 AM  Nursing: Waynetta SandyJan Wright, RN 08/19/2016  10:59 AM  RN Care Manager: Onnie BoerJennifer Clark, RN 08/19/2016  10:59 AM  Social Worker: Vernie ShanksLauren Brailon Don, LCSW 08/19/2016  10:59 AM  Recreational Therapist:  08/19/2016  10:59 AM  Other: Claudette Headonrad Withrow, NP; Gray BernhardtMay Augustin, NP 08/19/2016  10:59 AM  Other:  08/19/2016  10:59 AM  Other: 08/19/2016  10:59 AM    Scribe for Treatment Team: Verdene LennertLauren C Reinaldo Helt, LCSW 08/19/2016 10:59 AM

## 2016-08-19 NOTE — [facility] Group Notes (Signed)
[facility] Group Notes:  Goals and Aromatherapy   Date:  08/19/2016  Time:  2:19 PM  Type of Therapy:  Psychoeducational Skills  Participation Level:  Did Not Attend  Participation Quality:  N/A  Affect:  N/A  Cognitive:  N/A  Insight:  None  Engagement in Group:  None  Modes of Intervention:  Activity, Discussion and Education  Summary of Progress/Problems: Patient was invited but did not attend.   Ned ClinesDopson, Idora Brosious E 08/19/2016, 2:19 PM
